# Patient Record
Sex: Male | Born: 1950
Health system: Southern US, Community
[De-identification: ages and names within clinical notes are randomized; demographics above are authoritative.]

## PROBLEM LIST (undated history)

## (undated) DIAGNOSIS — I2510 Atherosclerotic heart disease of native coronary artery without angina pectoris: Secondary | ICD-10-CM

## (undated) DIAGNOSIS — E785 Hyperlipidemia, unspecified: Secondary | ICD-10-CM

## (undated) DIAGNOSIS — M545 Low back pain, unspecified: Secondary | ICD-10-CM

## (undated) DIAGNOSIS — I4510 Unspecified right bundle-branch block: Secondary | ICD-10-CM

## (undated) DIAGNOSIS — K219 Gastro-esophageal reflux disease without esophagitis: Secondary | ICD-10-CM

## (undated) DIAGNOSIS — K635 Polyp of colon: Secondary | ICD-10-CM

## (undated) DIAGNOSIS — I10 Essential (primary) hypertension: Secondary | ICD-10-CM

## (undated) DIAGNOSIS — C61 Malignant neoplasm of prostate: Secondary | ICD-10-CM

## (undated) DIAGNOSIS — G4730 Sleep apnea, unspecified: Secondary | ICD-10-CM

## (undated) DIAGNOSIS — E119 Type 2 diabetes mellitus without complications: Secondary | ICD-10-CM

## (undated) HISTORY — DX: Essential (primary) hypertension: I10

## (undated) HISTORY — DX: Malignant neoplasm of prostate: C61

## (undated) HISTORY — DX: Low back pain: M54.5

## (undated) HISTORY — DX: Hyperlipidemia, unspecified: E78.5

## (undated) HISTORY — PX: OTHER SURGICAL HISTORY: SHX169

## (undated) HISTORY — PX: TONSILLECTOMY: SUR1361

## (undated) HISTORY — PX: TOOTH EXTRACTION: SUR596

## (undated) HISTORY — DX: Low back pain, unspecified: M54.50

## (undated) HISTORY — DX: Atherosclerotic heart disease of native coronary artery without angina pectoris: I25.10

## (undated) HISTORY — PX: ABDOMINAL SURGERY: SHX537

## (undated) HISTORY — PX: MENISCUS REPAIR: SHX5179

## (undated) HISTORY — DX: Unspecified right bundle-branch block: I45.10

## (undated) HISTORY — DX: Polyp of colon: K63.5

## (undated) HISTORY — DX: Sleep apnea, unspecified: G47.30

## (undated) HISTORY — DX: Gastro-esophageal reflux disease without esophagitis: K21.9

## (undated) HISTORY — PX: INGUINAL HERNIA REPAIR: SUR1180

---

## 1994-06-13 HISTORY — PX: BACK SURGERY: SHX140

## 2006-11-19 ENCOUNTER — Emergency Department (HOSPITAL_COMMUNITY): Admission: EM | Admit: 2006-11-19 | Discharge: 2006-11-19 | Payer: Self-pay | Admitting: Emergency Medicine

## 2007-02-14 ENCOUNTER — Emergency Department (HOSPITAL_COMMUNITY): Admission: EM | Admit: 2007-02-14 | Discharge: 2007-02-14 | Payer: Self-pay | Admitting: Emergency Medicine

## 2009-07-14 ENCOUNTER — Encounter: Admission: RE | Admit: 2009-07-14 | Discharge: 2009-07-14 | Payer: Self-pay | Admitting: *Deleted

## 2009-11-26 ENCOUNTER — Ambulatory Visit (HOSPITAL_COMMUNITY): Admission: RE | Admit: 2009-11-26 | Discharge: 2009-11-26 | Payer: Self-pay | Admitting: General Surgery

## 2010-08-30 LAB — DIFFERENTIAL
Basophils Relative: 1 % (ref 0–1)
Eosinophils Relative: 3 % (ref 0–5)
Lymphocytes Relative: 26 % (ref 12–46)
Lymphs Abs: 2.4 10*3/uL (ref 0.7–4.0)
Neutro Abs: 5.9 10*3/uL (ref 1.7–7.7)
Neutrophils Relative %: 63 % (ref 43–77)

## 2010-08-30 LAB — BASIC METABOLIC PANEL
CO2: 27 mEq/L (ref 19–32)
Chloride: 107 mEq/L (ref 96–112)
GFR calc Af Amer: >60 mL/min (ref >60)
GFR calc non Af Amer: >60 mL/min (ref >60)
Glucose, Bld: 114 mg/dL — ABNORMAL HIGH (ref 70–99)
Potassium: 4.1 mEq/L (ref 3.5–5.1)
Sodium: 141 mEq/L (ref 135–145)

## 2010-08-30 LAB — CBC
MCV: 95.4 fL (ref 78.0–100.0)
Platelets: 207 10*3/uL (ref 150–400)
RDW: 13.4 % (ref 11.5–15.5)

## 2010-11-04 ENCOUNTER — Emergency Department (HOSPITAL_COMMUNITY): Payer: BC Managed Care – PPO

## 2010-11-04 ENCOUNTER — Emergency Department (HOSPITAL_COMMUNITY)
Admission: EM | Admit: 2010-11-04 | Discharge: 2010-11-04 | Disposition: A | Discharge status: Home / Self Care | Payer: BC Managed Care – PPO | Attending: Emergency Medicine | Admitting: Emergency Medicine

## 2010-11-04 DIAGNOSIS — M1990 Unspecified osteoarthritis, unspecified site: Secondary | ICD-10-CM | POA: Insufficient documentation

## 2010-11-04 DIAGNOSIS — I10 Essential (primary) hypertension: Secondary | ICD-10-CM | POA: Insufficient documentation

## 2010-11-04 DIAGNOSIS — D72829 Elevated white blood cell count, unspecified: Secondary | ICD-10-CM | POA: Insufficient documentation

## 2010-11-04 DIAGNOSIS — F17200 Nicotine dependence, unspecified, uncomplicated: Secondary | ICD-10-CM | POA: Insufficient documentation

## 2010-11-04 DIAGNOSIS — R1110 Vomiting, unspecified: Secondary | ICD-10-CM | POA: Insufficient documentation

## 2010-11-04 DIAGNOSIS — R1032 Left lower quadrant pain: Secondary | ICD-10-CM | POA: Insufficient documentation

## 2010-11-04 DIAGNOSIS — M549 Dorsalgia, unspecified: Secondary | ICD-10-CM | POA: Insufficient documentation

## 2010-11-04 DIAGNOSIS — R109 Unspecified abdominal pain: Secondary | ICD-10-CM | POA: Insufficient documentation

## 2010-11-04 LAB — DIFFERENTIAL
Eosinophils Relative: 2 % (ref 0–5)
Lymphocytes Relative: 19 % (ref 12–46)
Monocytes Absolute: 1.2 10*3/uL — ABNORMAL HIGH (ref 0.1–1.0)
Monocytes Relative: 11 % (ref 3–12)
Neutro Abs: 7.5 10*3/uL (ref 1.7–7.7)

## 2010-11-04 LAB — CBC
MCHC: 33.6 g/dL (ref 30.0–36.0)
Platelets: 220 10*3/uL (ref 150–400)
RBC: 4.66 MIL/uL (ref 4.22–5.81)
RDW: 12.9 % (ref 11.5–15.5)
WBC: 10.9 10*3/uL — ABNORMAL HIGH (ref 4.0–10.5)

## 2010-11-04 LAB — URINALYSIS, ROUTINE W REFLEX MICROSCOPIC
Bilirubin Urine: NEGATIVE
Protein, ur: NEGATIVE mg/dL
Specific Gravity, Urine: 1.027 (ref 1.005–1.030)

## 2010-11-04 LAB — COMPREHENSIVE METABOLIC PANEL
AST: 19 U/L (ref 0–37)
CO2: 24 mEq/L (ref 19–32)
Chloride: 102 mEq/L (ref 96–112)
Creatinine, Ser: 0.83 mg/dL (ref 0.4–1.5)
GFR calc Af Amer: >60 mL/min (ref >60)
GFR calc non Af Amer: >60 mL/min (ref >60)
Potassium: 3.8 mEq/L (ref 3.5–5.1)
Sodium: 136 mEq/L (ref 135–145)
Total Bilirubin: 0.4 mg/dL (ref 0.3–1.2)

## 2010-11-04 MED ORDER — IOHEXOL 300 MG/ML  SOLN
100.0000 mL | Freq: Once | INTRAMUSCULAR | Status: AC | PRN
  Administered 2010-11-04: 100 mL via INTRAVENOUS

## 2010-11-16 ENCOUNTER — Other Ambulatory Visit: Payer: Self-pay | Admitting: Physical Medicine and Rehabilitation

## 2010-11-16 DIAGNOSIS — M1990 Unspecified osteoarthritis, unspecified site: Secondary | ICD-10-CM

## 2010-11-16 DIAGNOSIS — M25551 Pain in right hip: Secondary | ICD-10-CM

## 2010-11-24 ENCOUNTER — Other Ambulatory Visit: Payer: BC Managed Care – PPO

## 2011-03-08 DIAGNOSIS — C61 Malignant neoplasm of prostate: Secondary | ICD-10-CM

## 2011-03-08 HISTORY — DX: Malignant neoplasm of prostate: C61

## 2011-06-21 DIAGNOSIS — N529 Male erectile dysfunction, unspecified: Secondary | ICD-10-CM | POA: Insufficient documentation

## 2011-06-21 DIAGNOSIS — N393 Stress incontinence (female) (male): Secondary | ICD-10-CM | POA: Insufficient documentation

## 2011-06-21 HISTORY — DX: Male erectile dysfunction, unspecified: N52.9

## 2011-12-16 ENCOUNTER — Other Ambulatory Visit: Payer: Self-pay | Admitting: Family Medicine

## 2011-12-16 DIAGNOSIS — R0600 Dyspnea, unspecified: Secondary | ICD-10-CM

## 2011-12-16 DIAGNOSIS — R05 Cough: Secondary | ICD-10-CM

## 2012-01-12 HISTORY — PX: PROSTATECTOMY: SHX69

## 2012-01-27 ENCOUNTER — Ambulatory Visit
Admission: RE | Admit: 2012-01-27 | Discharge: 2012-01-27 | Disposition: A | Discharge status: Home / Self Care | Payer: BC Managed Care – PPO | Source: Ambulatory Visit | Attending: Family Medicine | Admitting: Family Medicine

## 2012-01-27 DIAGNOSIS — R0600 Dyspnea, unspecified: Secondary | ICD-10-CM

## 2012-01-27 DIAGNOSIS — R05 Cough: Secondary | ICD-10-CM

## 2012-03-09 DIAGNOSIS — N35919 Unspecified urethral stricture, male, unspecified site: Secondary | ICD-10-CM | POA: Insufficient documentation

## 2012-06-13 DIAGNOSIS — C61 Malignant neoplasm of prostate: Secondary | ICD-10-CM

## 2012-06-13 HISTORY — DX: Malignant neoplasm of prostate: C61

## 2012-08-16 DIAGNOSIS — Z8546 Personal history of malignant neoplasm of prostate: Secondary | ICD-10-CM | POA: Insufficient documentation

## 2012-08-16 DIAGNOSIS — R6882 Decreased libido: Secondary | ICD-10-CM

## 2012-08-16 DIAGNOSIS — N5082 Scrotal pain: Secondary | ICD-10-CM

## 2012-08-16 HISTORY — DX: Decreased libido: R68.82

## 2012-08-16 HISTORY — DX: Scrotal pain: N50.82

## 2012-08-16 HISTORY — DX: Personal history of malignant neoplasm of prostate: Z85.46

## 2012-11-06 DIAGNOSIS — E538 Deficiency of other specified B group vitamins: Secondary | ICD-10-CM | POA: Insufficient documentation

## 2013-04-01 ENCOUNTER — Ambulatory Visit: Payer: Self-pay | Admitting: Podiatry

## 2013-04-03 ENCOUNTER — Ambulatory Visit: Payer: Self-pay | Admitting: Podiatry

## 2013-04-08 ENCOUNTER — Ambulatory Visit: Payer: Self-pay | Admitting: Podiatry

## 2013-08-16 ENCOUNTER — Other Ambulatory Visit: Payer: Self-pay | Admitting: Family

## 2013-08-16 DIAGNOSIS — R7989 Other specified abnormal findings of blood chemistry: Secondary | ICD-10-CM

## 2013-08-23 ENCOUNTER — Ambulatory Visit
Admission: RE | Admit: 2013-08-23 | Discharge: 2013-08-23 | Disposition: A | Discharge status: Home / Self Care | Payer: BC Managed Care – PPO | Source: Ambulatory Visit | Attending: Family | Admitting: Family

## 2013-08-23 DIAGNOSIS — R7989 Other specified abnormal findings of blood chemistry: Secondary | ICD-10-CM

## 2014-03-25 ENCOUNTER — Encounter: Payer: Self-pay | Admitting: *Deleted

## 2014-06-17 ENCOUNTER — Other Ambulatory Visit: Payer: Self-pay | Admitting: Family Medicine

## 2014-06-17 DIAGNOSIS — R911 Solitary pulmonary nodule: Secondary | ICD-10-CM

## 2014-06-17 DIAGNOSIS — R05 Cough: Secondary | ICD-10-CM

## 2014-06-17 DIAGNOSIS — Z87891 Personal history of nicotine dependence: Secondary | ICD-10-CM

## 2014-06-17 DIAGNOSIS — R053 Chronic cough: Secondary | ICD-10-CM

## 2014-06-19 ENCOUNTER — Ambulatory Visit
Admission: RE | Admit: 2014-06-19 | Discharge: 2014-06-19 | Disposition: A | Discharge status: Home / Self Care | Payer: BLUE CROSS/BLUE SHIELD | Source: Ambulatory Visit | Attending: Family Medicine | Admitting: Family Medicine

## 2014-06-19 DIAGNOSIS — R911 Solitary pulmonary nodule: Secondary | ICD-10-CM

## 2014-06-19 DIAGNOSIS — Z87891 Personal history of nicotine dependence: Secondary | ICD-10-CM

## 2014-06-19 DIAGNOSIS — R053 Chronic cough: Secondary | ICD-10-CM

## 2014-06-19 DIAGNOSIS — R05 Cough: Secondary | ICD-10-CM

## 2014-06-19 MED ORDER — IOHEXOL 300 MG/ML  SOLN
75.0000 mL | Freq: Once | INTRAMUSCULAR | Status: AC | PRN
  Administered 2014-06-19: 75 mL via INTRAVENOUS

## 2014-07-02 ENCOUNTER — Institutional Professional Consult (permissible substitution): Payer: BLUE CROSS/BLUE SHIELD | Admitting: Internal Medicine

## 2014-09-01 ENCOUNTER — Ambulatory Visit (INDEPENDENT_AMBULATORY_CARE_PROVIDER_SITE_OTHER): Payer: BLUE CROSS/BLUE SHIELD | Admitting: Pulmonary Disease

## 2014-09-01 ENCOUNTER — Encounter: Payer: Self-pay | Admitting: Pulmonary Disease

## 2014-09-01 VITALS — BP 148/80 | HR 70 | Temp 97.1°F | Ht 71.0 in | Wt 265.8 lb

## 2014-09-01 DIAGNOSIS — R918 Other nonspecific abnormal finding of lung field: Secondary | ICD-10-CM | POA: Diagnosis not present

## 2014-09-01 DIAGNOSIS — R0609 Other forms of dyspnea: Secondary | ICD-10-CM

## 2014-09-01 MED ORDER — VARENICLINE TARTRATE 1 MG PO TABS: 1.0000 mg | ORAL_TABLET | Freq: Two times a day (BID) | ORAL | Status: DC

## 2014-09-01 MED ORDER — VARENICLINE TARTRATE 0.5 MG X 11 & 1 MG X 42 PO MISC: ORAL | Status: DC

## 2014-09-01 NOTE — Progress Notes (Signed)
   Subjective:    Patient ID: James Lee, male    DOB: 18-Jul-1950, 64 y.o.   MRN: [id number]  HPI The patient is a 64 year old male who I've been asked to see for multiple pulmonary issues. He has a long-standing history of smoking, and unfortunately continues to smoke about a pack and a half of cigarettes a day. He has had a chronic cough that is much improved since discontinuation of his ACE inhibitor. He has had a recent CT chest that showed multiple pulmonary nodules, as well as emphysematous changes. Overall, the patient has fairly good exertional tolerance, and states that he can walk miles at a moderate pace without getting winded. However, if he walks up a flight of stairs he will come short of breath. He is overweight, but his weight is been neutral over the last few years. He tells me that he has approximately 2 episodes of acute bronchitis a year, but has not required hospitalization for breathing issues. He does have a history of prostate cancer for which she was treated with a prostatectomy, and has had normal PSA values since that time.   Review of Systems  Constitutional: Negative for fever, chills, activity change, appetite change and unexpected weight change.  HENT: Positive for congestion and dental problem. Negative for postnasal drip, rhinorrhea, sneezing, sore throat, trouble swallowing and voice change.   Eyes: Negative for visual disturbance.  Respiratory: Positive for cough and shortness of breath. Negative for choking.   Cardiovascular: Negative for chest pain and leg swelling.  Gastrointestinal: Negative for nausea, vomiting and abdominal pain.  Genitourinary: Negative for difficulty urinating.  Musculoskeletal: Positive for arthralgias.  Skin: Negative for rash.  Psychiatric/Behavioral: Negative for behavioral problems and confusion.       Objective:   Physical Exam Constitutional:  Obese male, no acute distress  HENT:  Nares patent without discharge  Oropharynx  without exudate, palate and uvula are normal  Eyes:  Perrla, eomi, no scleral icterus  Neck:  No JVD, no TMG  Cardiovascular:  Normal rate, regular rhythm, no rubs or gallops.  No murmurs        Intact distal pulses  Pulmonary :  Normal breath sounds, no stridor or respiratory distress   No rales, rhonchi, or wheezing  Abdominal:  Soft, nondistended, bowel sounds present.  No tenderness noted.   Musculoskeletal:  No lower extremity edema noted.  Lymph Nodes:  No cervical lymphadenopathy noted  Skin:  No cyanosis noted  Neurologic:  Alert, appropriate, moves all 4 extremities without obvious deficit.         Assessment & Plan:

## 2014-09-01 NOTE — Assessment & Plan Note (Signed)
The patient has only mild dyspnea on exertion that is primarily with heavy her activities, but his CT chest does have emphysematous changes. At this point, he will need full pulmonary function studies for evaluation. He has had a chronic cough that is much improved since discontinuation of his ACE inhibitor, and I suspect it will totally resolve once he is able to quit smoking.

## 2014-09-01 NOTE — Patient Instructions (Signed)
Will schedule for breathing studies in next 2-4 weeks, and see you back on same day to review with you Will schedule for followup scan of your chest in 96mos, and will call with results. Start chantix one starter pack and 2 maintenance packs.  You should start cutting way back now, and have a quit date 4 weeks down the road.

## 2014-09-01 NOTE — Assessment & Plan Note (Signed)
The patient has multiple pulmonary nodules on his CT chest in January, and is at increased risk for cancer given his extensive smoking history. It is unlikely this is related to his prior history of prostate cancer. I would normally do a one-year follow-up for his smaller nodules, but he has a 6 mm groundglass opacity in the left upper lobe that I think needs follow-up in 6 months.

## 2014-09-02 ENCOUNTER — Encounter: Payer: Self-pay | Admitting: Cardiology

## 2014-09-02 ENCOUNTER — Ambulatory Visit (INDEPENDENT_AMBULATORY_CARE_PROVIDER_SITE_OTHER): Payer: BLUE CROSS/BLUE SHIELD | Admitting: Cardiology

## 2014-09-02 VITALS — BP 146/78 | HR 69 | Ht 71.0 in | Wt 261.1 lb

## 2014-09-02 DIAGNOSIS — R0609 Other forms of dyspnea: Secondary | ICD-10-CM

## 2014-09-02 DIAGNOSIS — E785 Hyperlipidemia, unspecified: Secondary | ICD-10-CM

## 2014-09-02 DIAGNOSIS — I10 Essential (primary) hypertension: Secondary | ICD-10-CM | POA: Diagnosis not present

## 2014-09-02 DIAGNOSIS — G4730 Sleep apnea, unspecified: Secondary | ICD-10-CM | POA: Diagnosis not present

## 2014-09-02 DIAGNOSIS — I2510 Atherosclerotic heart disease of native coronary artery without angina pectoris: Secondary | ICD-10-CM

## 2014-09-02 DIAGNOSIS — E1169 Type 2 diabetes mellitus with other specified complication: Secondary | ICD-10-CM | POA: Insufficient documentation

## 2014-09-02 HISTORY — DX: Atherosclerotic heart disease of native coronary artery without angina pectoris: I25.10

## 2014-09-02 HISTORY — DX: Essential (primary) hypertension: I10

## 2014-09-02 NOTE — Patient Instructions (Signed)
Dr. Radford Pax recommends you have an [facility].  Your physician wants you to follow-up in: 1 year with Dr. Radford Pax. You will receive a reminder letter in the mail two months in advance. If you don't receive a letter, please call our office to schedule the follow-up appointment.

## 2014-09-02 NOTE — Progress Notes (Signed)
Cardiology Office Note   Date:  09/02/2014   ID:  James Lee, DOB 1951-05-26, MRN [id number]  PCP:  Antony Blackbird, MD  Cardiologist:   Sueanne Margarita, MD   No chief complaint on file.     History of Present Illness: James Lee is a 64 y.o. male who presents for evaluation of OSA. He has a history of OSA and has been on CPAP for some time but was lost to followup.  He uses his CPAP nightly and feels rested in the am.  He does have some daytime sleepiness and occasionally takes an afternoon nap.  He uses the nasal pillow mask which he tolerates well.  He uses [facility] for his DME.  He has a history of coronary artery calcifications on chest CT in 2013 and had a normal nuclear stress test in 2012.  He has a family  History of CAD and has dyslipidemia and HTN.  He recently had another chest CT that showed persistence of the coronary artery calcifications and he is now referred by his PCP.  He denies any chest pain or pressure.  He has minimal DOE with heavy activities.  Chest xray did show emphysematous changes.  He is now referred to Cardiology for evaluation for CAD.    Past Medical History  Diagnosis Date  . Hypertension   . LBP (low back pain)   . Dyslipidemia   . Colon polyps   . Sleep apnea   . Prostate cancer   . Hyperlipidemia     Past Surgical History  Procedure Laterality Date  . Prostatectomy  01/2012  . Back surgery  1996     Current Outpatient Prescriptions  Medication Sig Dispense Refill  . amLODipine (NORVASC) 10 MG tablet Take 10 mg by mouth daily.    Marland Kitchen aspirin 81 MG tablet Take 81 mg by mouth daily.    . Multiple Vitamins-Minerals (MULTIVITAMIN WITH MINERALS) tablet Take 1 tablet by mouth daily.    . naproxen (EC NAPROSYN) 500 MG EC tablet Take 500 mg by mouth 2 (two) times daily with a meal.    . simvastatin (ZOCOR) 20 MG tablet Take 20 mg by mouth daily. Take one tablet in every evening as needed.    . valsartan-hydrochlorothiazide (DIOVAN-HCT) 160-12.5 MG per  tablet Take 1 tablet by mouth daily.    . varenicline (CHANTIX PAK) 0.5 MG X 11 & 1 MG X 42 tablet Take 0.5 mg tab once daily x3 days, then increase to 0.5 mg tab twice daily x4 days then increase 1 mg tab twice daily. 53 tablet 0  . varenicline (CHANTIX) 1 MG tablet Take 1 tablet (1 mg total) by mouth 2 (two) times daily. 60 tablet 1   No current facility-administered medications for this visit.    Allergies:   Review of patient's allergies indicates no known allergies.    Social History:  The patient  reports that he has been smoking Cigarettes.  He has a 60 pack-year smoking history. He does not have any smokeless tobacco history on file. He reports that he does not drink alcohol or use illicit drugs.   Family History:  The patient's family history includes Cancer in his father; Coronary artery disease in his mother; Throat cancer in his father.    ROS:  Please see the history of present illness.   Otherwise, review of systems are positive for none.   All other systems are reviewed and negative.    PHYSICAL EXAM: VS:  BP 146/78  mmHg  Pulse 69  Ht 5\' 11"  (1.803 m)  Wt 261 lb 1.9 oz (118.443 kg)  BMI 36.43 kg/m2 , BMI Body mass index is 36.43 kg/(m^2). GEN: Well nourished, well developed, in no acute distress HEENT: normal Neck: no JVD, carotid bruits, or masses Cardiac: RRR; no murmurs, rubs, or gallops,no edema  Respiratory:  clear to auscultation bilaterally, normal work of breathing GI: soft, nontender, nondistended, + BS MS: no deformity or atrophy Skin: warm and dry, no rash Neuro:  Strength and sensation are intact Psych: euthymic mood, full affect   EKG:  EKG is ordered today. The ekg ordered today demonstrates NSR with no ST changes   Recent Labs: No results found for requested labs within last 365 days.    Lipid Panel No results found for: CHOL, TRIG, HDL, CHOLHDL, VLDL, LDLCALC, LDLDIRECT    Wt Readings from Last 3 Encounters:  09/02/14 261 lb 1.9 oz  (118.443 kg)  09/01/14 265 lb 12.8 oz (120.566 kg)      Other studies Reviewed: Additional studies/ records that were reviewed today include: OV notes from Pulmonary and PCP. Review of the above records demonstrates: workup of pulmonary nodules per pulonary   ASSESSMENT AND PLAN:  1.  Coronary artery calcifications on Chest CT which were present back in 2013.  He is asymptomatic from a cardiac standpoint.  His last stress test was 4 years ago.  He needs aggressive risk factor modification.  He is going on Chantix today that Dr. Gwenette Greet prescribed.  I have recommended a nuclear stress test to rule out ischemia.  He will continue on ASA daily.  He needs aggressive control of HTN. 2.  HTN with borderline control.  Continue amlodipine/Diovan HCT 3.  Tobacco use - he is starting CHantix 4.  Hyperlipidemia - continue statin 5.  OSA on CPAP and tolerating well.  I will get a CPAP d/l   Current medicines are reviewed at length with the patient today.  The patient does not have concerns regarding medicines.  The following changes have been made:  no change  Labs/ tests ordered today include: Stress myoview, CPAP d/l  No orders of the defined types were placed in this encounter.     Disposition:   FU with me in 1 year   Signed, Sueanne Margarita, MD  09/02/2014 9:21 AM    [address], [place], [place]  [address] Phone: [phone]; Fax: [phone]

## 2014-09-16 ENCOUNTER — Ambulatory Visit (HOSPITAL_COMMUNITY): Payer: BLUE CROSS/BLUE SHIELD | Attending: Cardiovascular Disease | Admitting: Radiology

## 2014-09-16 DIAGNOSIS — I2510 Atherosclerotic heart disease of native coronary artery without angina pectoris: Secondary | ICD-10-CM

## 2014-09-16 DIAGNOSIS — I10 Essential (primary) hypertension: Secondary | ICD-10-CM | POA: Insufficient documentation

## 2014-09-16 DIAGNOSIS — R0609 Other forms of dyspnea: Secondary | ICD-10-CM | POA: Insufficient documentation

## 2014-09-16 MED ORDER — TECHNETIUM TC 99M SESTAMIBI GENERIC - CARDIOLITE
11.0000 | Freq: Once | INTRAVENOUS | Status: AC | PRN
  Administered 2014-09-16: 11 via INTRAVENOUS

## 2014-09-16 MED ORDER — TECHNETIUM TC 99M SESTAMIBI GENERIC - CARDIOLITE
33.0000 | Freq: Once | INTRAVENOUS | Status: AC | PRN
Start: 2014-09-16 — End: 2014-09-16
  Administered 2014-09-16: 33 via INTRAVENOUS

## 2014-09-16 NOTE — Progress Notes (Signed)
[address], [place] [phone]    Cardiology Nuclear Med Study  James Lee is a 64 y.o. male     MRN : [id number]     DOB: 1951/05/06  Procedure Date: 09/16/2014  Nuclear Med Background Indication for Stress Test:  Evaluation for Ischemia History:  '12 MPI: [facility]  Cardiac Risk Factors: Hypertension  Symptoms:  DOE   Nuclear Pre-Procedure Caffeine/Decaff Intake:  None NPO After: 7:00pm   Lungs:  clear O2 Sat: 97% on room air. IV 0.9% NS with Angio Cath:  22g  IV Site: R Hand  IV Started by:  Matilde Haymaker, RN  Chest Size (in):  46 Cup Size: n/a  Height: 5\' 11"  (1.803 m)  Weight:  264 lb (119.75 kg)  BMI:  Body mass index is 36.84 kg/(m^2). Tech Comments:  n/a    Nuclear Med Study 1 or 2 day study: 1 day  Stress Test Type:  Stress  Reading MD: n/a  Order Authorizing Provider:  Kelty Szafran,MD  Resting Radionuclide: Technetium 63m Sestamibi  Resting Radionuclide Dose: 11.0 mCi   Stress Radionuclide:  Technetium 40m Sestamibi  Stress Radionuclide Dose: 33.0 mCi           Stress Protocol Rest HR: 61 Stress HR: 141  Rest BP: 114/66 Stress BP: 214/72  Exercise Time (min): 10:01 METS: 10.40   Predicted Max HR: 159 bpm % Max HR: 88.68 bpm Rate Pressure Product: 30174   Dose of Adenosine (mg):  n/a Dose of Lexiscan: n/a mg  Dose of Atropine (mg): n/a Dose of Dobutamine: n/a mcg/kg/min (at max HR)  Stress Test Technologist: Perrin Maltese, EMT-P  Nuclear Technologist:  Earl Many, CNMT     Rest Procedure:  Myocardial perfusion imaging was performed at rest 45 minutes following the intravenous administration of Technetium 71m Sestamibi. Rest ECG: NSR - Normal EKG  Stress Procedure:  The patient exercised on the treadmill utilizing the Bruce Protocol for 10:01 minutes. The patient stopped due to fatigue, sob,  and denied any chest pain.  Technetium 60m Sestamibi was injected at peak exercise and  myocardial perfusion imaging was performed after a brief delay. Stress ECG: occasional PVC's were noted and short 7 beat run of nonsustained atrial tachycardia.  No ST changes of ischemia.   QPS Raw Data Images:  Mild diaphragmatic attenuation.  Normal left ventricular size. Stress Images:  There is decreased uptake in the inferior wall. Rest Images:  There is decreased uptake in the inferior wall. Subtraction (SDS):  small in size, moderate in intensity, fixed defects in the basal and apical walls.  No ischemia noted. Transient Ischemic Dilatation (Normal <1.22):  0.80 Lung/Heart Ratio (Normal <0.45):  0.34  Quantitative Gated Spect Images QGS EDV:  153 ml QGS ESV:  68 ml  Impression Exercise Capacity:  Good exercise capacity. BP Response:  Hypotensive blood pressure response. Clinical Symptoms:  There is dyspnea. ECG Impression:  No significant ST segment change suggestive of ischemia. Comparison with Prior Nuclear Study: No images to compare  Overall Impression:  Intermediate risk stress nuclear study with small in size, moderate in intensity, fixed defects in the basal and apical inferior wall.  No ischemia noted. This most likely represents diaphragmatic attenuation.  The patient did have a hypotensive response during exercise.  Initial BP was 157/59mmHg.  This dropped to 118/52mmHg in stage 3 but went up to 214/60mmHg in immediate recovery so question whether lower BP readings during exercise were  accurate.  LV Ejection Fraction: 56%.  LV Wall Motion:  NL LV Function; NL Wall Motion  Signed: Fransico Him, MD [facility] 09/16/2014

## 2014-09-27 ENCOUNTER — Other Ambulatory Visit: Payer: Self-pay | Admitting: Pulmonary Disease

## 2014-09-30 ENCOUNTER — Telehealth: Payer: Self-pay | Admitting: Pulmonary Disease

## 2014-09-30 NOTE — Telephone Encounter (Signed)
Should postpone at least one week to get accurate results.

## 2014-09-30 NOTE — Telephone Encounter (Signed)
Spoke with pt, states he's been treated with URI the past week.  Pt is scheduled for a PFT tomorrow, wants to know if he should reschedule d/t the URI giving false results.  [place]ather please advise.  Thanks!

## 2014-09-30 NOTE — Telephone Encounter (Signed)
Pt is aware of recommendations. Per Suanne Marker- Please call pt to schd appt with [facility] first. Then they will schedule the PFT at [facility].  Pt needs to have this done on Tuesday or a Wednesday morning.

## 2014-09-30 NOTE — Telephone Encounter (Signed)
Spoke with the pt and scheduled ov with [facility] for 10/21/14  Will forward to [facility] to coordinate PFT appt to be done at [facility]

## 2014-09-30 NOTE — Telephone Encounter (Signed)
Pt returning call  [phone]

## 2014-09-30 NOTE — Telephone Encounter (Signed)
LMTCB

## 2014-10-01 ENCOUNTER — Ambulatory Visit: Payer: BLUE CROSS/BLUE SHIELD | Admitting: Pulmonary Disease

## 2014-10-01 NOTE — Telephone Encounter (Signed)
KC please advise. Thanks. 

## 2014-10-01 NOTE — Telephone Encounter (Signed)
Well on 10/21/14 [facility] only has a 11am pft that will not give them time to be here by 12 what do you suggest we do?

## 2014-10-01 NOTE — Telephone Encounter (Signed)
I have scheduled PFT for 10/22/14 at 10:45am at [facility]. ROV with [place] [facility] has been rescheduled to 10/22/14 at 11:45am.  Pt needed both appointments on the same day. Pt is aware of both appointments time, location and date. Nothing further was needed.

## 2014-10-01 NOTE — Telephone Encounter (Signed)
Either change his apptm with me and pfts to a different day, or have him keep pft appt and reschedule with me a different day.

## 2014-10-01 NOTE — Telephone Encounter (Signed)
LMOAM for Resp at [facility] to return my call to [phone] to scheduled PFT before appointment. Rhonda J Cobb

## 2014-10-20 ENCOUNTER — Encounter (HOSPITAL_COMMUNITY): Payer: BLUE CROSS/BLUE SHIELD

## 2014-10-21 ENCOUNTER — Ambulatory Visit: Payer: BLUE CROSS/BLUE SHIELD | Admitting: Pulmonary Disease

## 2014-10-22 ENCOUNTER — Ambulatory Visit (INDEPENDENT_AMBULATORY_CARE_PROVIDER_SITE_OTHER): Payer: BLUE CROSS/BLUE SHIELD | Admitting: Pulmonary Disease

## 2014-10-22 ENCOUNTER — Ambulatory Visit (HOSPITAL_COMMUNITY)
Admission: RE | Admit: 2014-10-22 | Discharge: 2014-10-22 | Disposition: A | Discharge status: Home / Self Care | Payer: BLUE CROSS/BLUE SHIELD | Source: Ambulatory Visit | Attending: Pulmonary Disease | Admitting: Pulmonary Disease

## 2014-10-22 ENCOUNTER — Encounter: Payer: Self-pay | Admitting: Pulmonary Disease

## 2014-10-22 VITALS — BP 126/66 | HR 63 | Temp 97.8°F | Ht 71.0 in | Wt 264.0 lb

## 2014-10-22 DIAGNOSIS — R0609 Other forms of dyspnea: Secondary | ICD-10-CM | POA: Insufficient documentation

## 2014-10-22 DIAGNOSIS — R918 Other nonspecific abnormal finding of lung field: Secondary | ICD-10-CM | POA: Diagnosis not present

## 2014-10-22 LAB — PULMONARY FUNCTION TEST
DL/VA % pred: 89 %
DL/VA: 4.19 ml/min/mmHg/L
DLCO unc % pred: 87 %
DLCO unc: 29.54 ml/min/mmHg
FEF 25-75 PRE: 2.5 L/s
FEF 25-75 Post: 1.8 L/sec
FEF2575-%Change-Post: -28 %
FEF2575-%Pred-Post: 60 %
FEF2575-%Pred-Pre: 83 %
FEV1-%Change-Post: -7 %
FEV1-%Pred-Post: 81 %
FEV1-%Pred-Pre: 87 %
FEV1-Post: 3 L
FEV1-Pre: 3.24 L
FEV1FVC-%Change-Post: -13 %
FEV1FVC-%Pred-Pre: 100 %
FEV6-%Change-Post: 5 %
FEV6-%PRED-POST: 95 %
FEV6-%Pred-Pre: 90 %
FEV6-POST: 4.43 L
FEV6-PRE: 4.2 L
FEV6FVC-%Change-Post: -1 %
FEV6FVC-%PRED-POST: 101 %
FEV6FVC-%PRED-PRE: 102 %
FVC-%Change-Post: 7 %
FVC-%PRED-PRE: 88 %
FVC-%Pred-Post: 94 %
FVC-POST: 4.61 L
FVC-Pre: 4.3 L
POST FEV1/FVC RATIO: 65 %
Post FEV6/FVC ratio: 96 %
Pre FEV1/FVC ratio: 75 %
Pre FEV6/FVC Ratio: 98 %
RV % PRED: 131 %
RV: 3.05 L
TLC % pred: 101 %
TLC: 7.34 L

## 2014-10-22 MED ORDER — ALBUTEROL SULFATE 108 (90 BASE) MCG/ACT IN AEPB: 2.0000 | INHALATION_SPRAY | Freq: Four times a day (QID) | RESPIRATORY_TRACT | Status: DC | PRN

## 2014-10-22 MED ORDER — ALBUTEROL SULFATE (2.5 MG/3ML) 0.083% IN NEBU
2.5000 mg | INHALATION_SOLUTION | Freq: Once | RESPIRATORY_TRACT | Status: AC
  Administered 2014-10-22: 2.5 mg via RESPIRATORY_TRACT

## 2014-10-22 NOTE — Assessment & Plan Note (Signed)
The patient has no significant airflow obstruction on his recent PFTs, but does have evidence for mild air-trapping. Since he does not have significant symptoms, I would prefer to hold off on maintenance bronchodilators, and just have him work on smoking cessation. I will give him a prescription for an albuterol inhaler for rescue, and he understands that if he is needing more frequent use, we might need to consider a maintenance medication. Overall, he has minimal lung disease, and will do well with smoking cessation and weight loss.

## 2014-10-22 NOTE — Assessment & Plan Note (Signed)
The pt has a f/u ct chest in July for f/u. I am primarily interested in his GGO

## 2014-10-22 NOTE — Progress Notes (Signed)
   Subjective:    Patient ID: James Lee, male    DOB: 05/10/1951, 64 y.o.   MRN: [id number]  HPI The patient comes in today for follow-up of his pulmonary function studies, done as part of a workup for his dyspnea with moderate to heavy exertional activities. Surprisingly, he was found to have no airflow obstruction by spirometry, but he did have some mild air-trapping on lung volumes. His total lung capacity and DLCO were normal. I have reviewed the study with him in detail, and answered all of his questions.   Review of Systems  Constitutional: Negative for fever and unexpected weight change.  HENT: Positive for congestion and postnasal drip. Negative for dental problem, ear pain, nosebleeds, rhinorrhea, sinus pressure, sneezing, sore throat and trouble swallowing.   Eyes: Negative for redness and itching.  Respiratory: Positive for cough and shortness of breath. Negative for chest tightness and wheezing.   Cardiovascular: Negative for palpitations and leg swelling.  Gastrointestinal: Negative for nausea and vomiting.  Genitourinary: Negative for dysuria.  Musculoskeletal: Negative for joint swelling.  Skin: Negative for rash.  Neurological: Negative for headaches.  Hematological: Does not bruise/bleed easily.  Psychiatric/Behavioral: Negative for dysphoric mood. The patient is not nervous/anxious.        Objective:   Physical Exam Overweight male in no acute distress Nose without purulence or discharge noted Neck without lymphadenopathy or thyromegaly Lower extremities with minimal edema, no cyanosis Alert and oriented, moves all 4 extremities.       Assessment & Plan:

## 2014-10-22 NOTE — Patient Instructions (Signed)
You do not have any significant COPD by your breathing studies.  Great news You do have some airtrapping, and this will resolve with smoking cessation. Will start on proair respiclick for rescue only.  Can take 2 inhalations every 6 hrs if needed for difficulty breathing.  If you are using consistently, let us know and we can consider a maintenance inhaler on a daily basis Work on weight loss Keep apptm for your scan in July of this year.  Will call you with results. followup with Dr. Lamonte Sakai in one year to check on your breathing, but call if worsening symptoms.

## 2014-11-28 ENCOUNTER — Other Ambulatory Visit: Payer: Self-pay | Admitting: Emergency Medicine

## 2014-11-28 DIAGNOSIS — R918 Other nonspecific abnormal finding of lung field: Secondary | ICD-10-CM

## 2014-11-28 NOTE — Progress Notes (Signed)
Seen by [facility] 10/22/14 CT Chest w/o ordered and pt instructed to follow up with RB CT ordered again but under [facility]

## 2014-12-03 ENCOUNTER — Telehealth: Payer: Self-pay | Admitting: Emergency Medicine

## 2014-12-03 NOTE — Telephone Encounter (Signed)
FYI Dr. Jacinto ReapKarl Ito patient for nodules Serial CT for follow up is scheduled for 7.13.16  Per the last ov on 5.11.16: Patient Instructions     You do not have any significant COPD by your breathing studies. Great news You do have some airtrapping, and this will resolve with smoking cessation. Will start on proair respiclick for rescue only. Can take 2 inhalations every 6 hrs if needed for difficulty breathing. If you are using consistently, let us know and we can consider a maintenance inhaler on a daily basis Work on weight loss Keep apptm for your scan in July of this year. Will call you with results. followup with Dr. Lamonte Sakai in one year to check on your breathing, but call if worsening symptoms   Will sign and forward to RB

## 2014-12-22 ENCOUNTER — Other Ambulatory Visit: Payer: BLUE CROSS/BLUE SHIELD

## 2014-12-24 ENCOUNTER — Inpatient Hospital Stay: Admission: RE | Admit: 2014-12-24 | Payer: BLUE CROSS/BLUE SHIELD | Source: Ambulatory Visit

## 2015-01-28 ENCOUNTER — Ambulatory Visit (INDEPENDENT_AMBULATORY_CARE_PROVIDER_SITE_OTHER)
Admission: RE | Admit: 2015-01-28 | Discharge: 2015-01-28 | Disposition: A | Discharge status: Home / Self Care | Payer: BLUE CROSS/BLUE SHIELD | Source: Ambulatory Visit | Attending: Emergency Medicine | Admitting: Emergency Medicine

## 2015-01-28 DIAGNOSIS — R918 Other nonspecific abnormal finding of lung field: Secondary | ICD-10-CM

## 2015-02-11 ENCOUNTER — Telehealth: Payer: Self-pay | Admitting: Emergency Medicine

## 2015-02-11 NOTE — Telephone Encounter (Signed)
Notes Recorded by Collene Gobble, MD on 02/10/2015 at 5:24 PM Please let patient know that the inflammatory nodules seen on his prior CT scan have resolved. He should not need any further imaging to follow these areas.     Spoke with pt and notified of results per Dr. Lamonte Sakai. Pt verbalized understanding and denied any questions.

## 2015-02-11 NOTE — Progress Notes (Signed)
Quick Note:  Spoke with pt and notified of results per Dr. Byrum. Pt verbalized understanding and denied any questions.  ______ 

## 2015-10-22 ENCOUNTER — Other Ambulatory Visit: Payer: Self-pay | Admitting: Physical Medicine and Rehabilitation

## 2015-10-22 DIAGNOSIS — M545 Low back pain: Secondary | ICD-10-CM

## 2015-10-23 ENCOUNTER — Ambulatory Visit
Admission: RE | Admit: 2015-10-23 | Discharge: 2015-10-23 | Disposition: A | Discharge status: Home / Self Care | Payer: BLUE CROSS/BLUE SHIELD | Source: Ambulatory Visit | Attending: Physical Medicine and Rehabilitation | Admitting: Physical Medicine and Rehabilitation

## 2015-10-23 DIAGNOSIS — M545 Low back pain: Secondary | ICD-10-CM

## 2015-10-23 MED ORDER — GADOBENATE DIMEGLUMINE 529 MG/ML IV SOLN
20.0000 mL | Freq: Once | INTRAVENOUS | Status: AC | PRN
  Administered 2015-10-23: 20 mL via INTRAVENOUS

## 2015-10-28 ENCOUNTER — Other Ambulatory Visit: Payer: Self-pay | Admitting: Physical Medicine and Rehabilitation

## 2015-10-28 DIAGNOSIS — G8929 Other chronic pain: Secondary | ICD-10-CM

## 2015-10-28 DIAGNOSIS — M545 Low back pain, unspecified: Secondary | ICD-10-CM

## 2015-11-03 DIAGNOSIS — M5126 Other intervertebral disc displacement, lumbar region: Secondary | ICD-10-CM | POA: Insufficient documentation

## 2015-11-03 DIAGNOSIS — M4317 Spondylolisthesis, lumbosacral region: Secondary | ICD-10-CM | POA: Insufficient documentation

## 2015-11-04 ENCOUNTER — Other Ambulatory Visit: Payer: BLUE CROSS/BLUE SHIELD

## 2015-11-04 ENCOUNTER — Ambulatory Visit
Admission: RE | Admit: 2015-11-04 | Discharge: 2015-11-04 | Disposition: A | Discharge status: Home / Self Care | Payer: BLUE CROSS/BLUE SHIELD | Source: Ambulatory Visit | Attending: Physical Medicine and Rehabilitation | Admitting: Physical Medicine and Rehabilitation

## 2015-11-04 DIAGNOSIS — M545 Low back pain, unspecified: Secondary | ICD-10-CM

## 2015-11-04 DIAGNOSIS — G8929 Other chronic pain: Secondary | ICD-10-CM

## 2015-11-04 MED ORDER — IOPAMIDOL (ISOVUE-M 200) INJECTION 41%
1.0000 mL | Freq: Once | INTRAMUSCULAR | Status: AC
  Administered 2015-11-04: 1 mL via EPIDURAL

## 2015-11-04 MED ORDER — METHYLPREDNISOLONE ACETATE 40 MG/ML INJ SUSP (RADIOLOG
120.0000 mg | Freq: Once | INTRAMUSCULAR | Status: AC
  Administered 2015-11-04: 120 mg via EPIDURAL

## 2015-11-04 NOTE — Discharge Instructions (Signed)

## 2015-11-12 ENCOUNTER — Emergency Department (HOSPITAL_COMMUNITY): Payer: BLUE CROSS/BLUE SHIELD

## 2015-11-12 ENCOUNTER — Encounter (HOSPITAL_COMMUNITY): Payer: Self-pay | Admitting: Emergency Medicine

## 2015-11-12 ENCOUNTER — Emergency Department (HOSPITAL_COMMUNITY)
Admission: EM | Admit: 2015-11-12 | Discharge: 2015-11-12 | Disposition: A | Discharge status: Home / Self Care | Payer: BLUE CROSS/BLUE SHIELD | Attending: Emergency Medicine | Admitting: Emergency Medicine

## 2015-11-12 DIAGNOSIS — I10 Essential (primary) hypertension: Secondary | ICD-10-CM | POA: Diagnosis not present

## 2015-11-12 DIAGNOSIS — M5442 Lumbago with sciatica, left side: Secondary | ICD-10-CM | POA: Insufficient documentation

## 2015-11-12 DIAGNOSIS — R791 Abnormal coagulation profile: Secondary | ICD-10-CM | POA: Diagnosis not present

## 2015-11-12 DIAGNOSIS — Z7982 Long term (current) use of aspirin: Secondary | ICD-10-CM | POA: Diagnosis not present

## 2015-11-12 DIAGNOSIS — Z8546 Personal history of malignant neoplasm of prostate: Secondary | ICD-10-CM | POA: Diagnosis not present

## 2015-11-12 DIAGNOSIS — E785 Hyperlipidemia, unspecified: Secondary | ICD-10-CM | POA: Diagnosis not present

## 2015-11-12 DIAGNOSIS — M545 Low back pain: Secondary | ICD-10-CM | POA: Diagnosis present

## 2015-11-12 DIAGNOSIS — M5432 Sciatica, left side: Secondary | ICD-10-CM

## 2015-11-12 DIAGNOSIS — F17210 Nicotine dependence, cigarettes, uncomplicated: Secondary | ICD-10-CM | POA: Insufficient documentation

## 2015-11-12 DIAGNOSIS — Z79899 Other long term (current) drug therapy: Secondary | ICD-10-CM | POA: Diagnosis not present

## 2015-11-12 LAB — CBC WITH DIFFERENTIAL/PLATELET
Basophils Absolute: 0 10*3/uL (ref 0.0–0.1)
Basophils Relative: 0 %
EOS ABS: 0.1 10*3/uL (ref 0.0–0.7)
Eosinophils Relative: 1 %
HEMATOCRIT: 45.7 % (ref 39.0–52.0)
HEMOGLOBIN: 16.1 g/dL (ref 13.0–17.0)
LYMPHS ABS: 4.1 10*3/uL — AB (ref 0.7–4.0)
LYMPHS PCT: 29 %
MCH: 32.1 pg (ref 26.0–34.0)
MCHC: 35.2 g/dL (ref 30.0–36.0)
MCV: 91.2 fL (ref 78.0–100.0)
MONOS PCT: 8 %
Monocytes Absolute: 1.1 10*3/uL — ABNORMAL HIGH (ref 0.1–1.0)
NEUTROS ABS: 8.8 10*3/uL — AB (ref 1.7–7.7)
NEUTROS PCT: 62 %
Platelets: 223 10*3/uL (ref 150–400)
RBC: 5.01 MIL/uL (ref 4.22–5.81)
RDW: 12.8 % (ref 11.5–15.5)
WBC: 14 10*3/uL — ABNORMAL HIGH (ref 4.0–10.5)

## 2015-11-12 LAB — C-REACTIVE PROTEIN: CRP: <0.5 mg/dL (ref <1.0)

## 2015-11-12 LAB — BASIC METABOLIC PANEL
Anion gap: 9 (ref 5–15)
BUN: 20 mg/dL (ref 6–20)
CO2: 26 mmol/L (ref 22–32)
CREATININE: 0.91 mg/dL (ref 0.61–1.24)
Calcium: 9 mg/dL (ref 8.9–10.3)
Chloride: 102 mmol/L (ref 101–111)
GFR calc Af Amer: >60 mL/min (ref >60)
GFR calc non Af Amer: >60 mL/min (ref >60)
GLUCOSE: 148 mg/dL — AB (ref 65–99)
POTASSIUM: 3.7 mmol/L (ref 3.5–5.1)
Sodium: 137 mmol/L (ref 135–145)

## 2015-11-12 LAB — APTT: aPTT: 25 seconds (ref 24–37)

## 2015-11-12 LAB — SEDIMENTATION RATE: Sed Rate: 1 mm/hr (ref 0–16)

## 2015-11-12 LAB — PROTIME-INR
INR: 1.01 (ref 0.00–1.49)
Prothrombin Time: 13.1 seconds (ref 11.6–15.2)

## 2015-11-12 MED ORDER — OXYCODONE HCL 5 MG PO TABS: 5.0000 mg | ORAL_TABLET | ORAL | Status: DC | PRN

## 2015-11-12 MED ORDER — DIAZEPAM 5 MG/ML IJ SOLN
5.0000 mg | Freq: Once | INTRAMUSCULAR | Status: AC
  Administered 2015-11-12: 5 mg via INTRAVENOUS
  Filled 2015-11-12: qty 2

## 2015-11-12 MED ORDER — DIAZEPAM 5 MG PO TABS
5.0000 mg | ORAL_TABLET | Freq: Once | ORAL | Status: DC
  Filled 2015-11-12: qty 1

## 2015-11-12 MED ORDER — SODIUM CHLORIDE 0.9 % IV BOLUS (SEPSIS)
1000.0000 mL | Freq: Once | INTRAVENOUS | Status: AC
  Administered 2015-11-12: 1000 mL via INTRAVENOUS

## 2015-11-12 MED ORDER — ONDANSETRON HCL 4 MG/2ML IJ SOLN
4.0000 mg | Freq: Once | INTRAMUSCULAR | Status: AC
  Administered 2015-11-12: 4 mg via INTRAVENOUS
  Filled 2015-11-12: qty 2

## 2015-11-12 MED ORDER — HYDROMORPHONE HCL 1 MG/ML IJ SOLN
1.0000 mg | Freq: Once | INTRAMUSCULAR | Status: AC
  Administered 2015-11-12: 1 mg via INTRAVENOUS
  Filled 2015-11-12: qty 1

## 2015-11-12 NOTE — ED Notes (Signed)
Patient transported to MRI 

## 2015-11-12 NOTE — ED Notes (Signed)
Bed: [id number] Expected date:  Expected time:  Means of arrival:  Comments: 37 M Lt lower back pain EMS

## 2015-11-12 NOTE — Discharge Instructions (Signed)
Take 4 over the counter ibuprofen tablets 3 times a day or 2 over-the-counter naproxen tablets twice a day for pain. Take the pain medicine only as you need it. Follow up with the neurosurgeon. Sciatica Sciatica is pain, weakness, numbness, or tingling along the path of the sciatic nerve. The nerve starts in the lower back and runs down the back of each leg. The nerve controls the muscles in the lower leg and in the back of the knee, while also providing sensation to the back of the thigh, lower leg, and the sole of your foot. Sciatica is a symptom of another medical condition. For instance, nerve damage or certain conditions, such as a herniated disk or bone spur on the spine, pinch or put pressure on the sciatic nerve. This causes the pain, weakness, or other sensations normally associated with sciatica. Generally, sciatica only affects one side of the body. CAUSES   Herniated or slipped disc.  Degenerative disk disease.  A pain disorder involving the narrow muscle in the buttocks (piriformis syndrome).  Pelvic injury or fracture.  Pregnancy.  Tumor (rare). SYMPTOMS  Symptoms can vary from mild to very severe. The symptoms usually travel from the low back to the buttocks and down the back of the leg. Symptoms can include:  Mild tingling or dull aches in the lower back, leg, or hip.  Numbness in the back of the calf or sole of the foot.  Burning sensations in the lower back, leg, or hip.  Sharp pains in the lower back, leg, or hip.  Leg weakness.  Severe back pain inhibiting movement. These symptoms may get worse with coughing, sneezing, laughing, or prolonged sitting or standing. Also, being overweight may worsen symptoms. DIAGNOSIS  Your caregiver will perform a physical exam to look for common symptoms of sciatica. He or she may ask you to do certain movements or activities that would trigger sciatic nerve pain. Other tests may be performed to find the cause of the sciatica.  These may include:  Blood tests.  X-rays.  Imaging tests, such as an MRI or CT scan. TREATMENT  Treatment is directed at the cause of the sciatic pain. Sometimes, treatment is not necessary and the pain and discomfort goes away on its own. If treatment is needed, your caregiver may suggest:  Over-the-counter medicines to relieve pain.  Prescription medicines, such as anti-inflammatory medicine, muscle relaxants, or narcotics.  Applying heat or ice to the painful area.  Steroid injections to lessen pain, irritation, and inflammation around the nerve.  Reducing activity during periods of pain.  Exercising and stretching to strengthen your abdomen and improve flexibility of your spine. Your caregiver may suggest losing weight if the extra weight makes the back pain worse.  Physical therapy.  Surgery to eliminate what is pressing or pinching the nerve, such as a bone spur or part of a herniated disk. HOME CARE INSTRUCTIONS   Only take over-the-counter or prescription medicines for pain or discomfort as directed by your caregiver.  Apply ice to the affected area for 20 minutes, 3-4 times a day for the first 48-72 hours. Then try heat in the same way.  Exercise, stretch, or perform your usual activities if these do not aggravate your pain.  Attend physical therapy sessions as directed by your caregiver.  Keep all follow-up appointments as directed by your caregiver.  Do not wear high heels or shoes that do not provide proper support.  Check your mattress to see if it is too soft. A  firm mattress may lessen your pain and discomfort. SEEK IMMEDIATE MEDICAL CARE IF:   You lose control of your bowel or bladder (incontinence).  You have increasing weakness in the lower back, pelvis, buttocks, or legs.  You have redness or swelling of your back.  You have a burning sensation when you urinate.  You have pain that gets worse when you lie down or awakens you at night.  Your pain  is worse than you have experienced in the past.  Your pain is lasting longer than 4 weeks.  You are suddenly losing weight without reason. MAKE SURE YOU:  Understand these instructions.  Will watch your condition.  Will get help right away if you are not doing well or get worse.   This information is not intended to replace advice given to you by your health care provider. Make sure you discuss any questions you have with your health care provider.   Document Released: 05/24/2001 Document Revised: 02/18/2015 Document Reviewed: 10/09/2011 Elsevier Interactive Patient Education [employer].

## 2015-11-12 NOTE — ED Provider Notes (Signed)
CSN: [id number]     Arrival date & time 11/12/15  0846 History   First MD Initiated Contact with Patient 11/12/15 [phone]     Chief Complaint  Patient presents with  . Back Pain     (Consider location/radiation/quality/duration/timing/severity/associated sxs/prior Treatment) Patient is a 65 y.o. male presenting with back pain. The history is provided by the patient.  Back Pain Location:  Lumbar spine Quality:  Stabbing Radiates to:  Does not radiate Pain severity:  Moderate Pain is:  Worse during the day Onset quality:  Sudden Duration:  2 days Timing:  Constant Progression:  Worsening Chronicity:  New Relieved by:  Nothing Worsened by:  Nothing tried Ineffective treatments:  None tried Associated symptoms: no abdominal pain, no chest pain, no fever and no headaches    65 yo M With a chief complaint of left-sided low back pain. This been going on for about a month now is seen a spinal specialist who gave him to her for injections. Last injection was about 3 days ago after which he had significant worsening of his pain. This morning woke up and felt that it was worse than at the beginning of the symptoms. Denies any new injuries. Denies fevers or chills. Denies difficulty with bowel or bladder. Denies loss perirectal sensation. Having some paresthesias to the left lower extremity. Has some weakness he thinks is secondary to pain.  Past Medical History  Diagnosis Date  . Hypertension   . LBP (low back pain)   . Dyslipidemia   . Colon polyps   . Sleep apnea   . Prostate cancer ([place])   . Hyperlipidemia    Past Surgical History  Procedure Laterality Date  . Prostatectomy  01/2012  . Back surgery  1996   Family History  Problem Relation Age of Onset  . Throat cancer Father   . Cancer Father   . Coronary artery disease Mother    Social History  Substance Use Topics  . Smoking status: Current Every Day Smoker -- 0.50 packs/day for 40 years    Types: Cigarettes  . Smokeless  tobacco: None  . Alcohol Use: No    Review of Systems  Constitutional: Negative for fever and chills.  HENT: Negative for congestion and facial swelling.   Eyes: Negative for discharge and visual disturbance.  Respiratory: Negative for shortness of breath.   Cardiovascular: Negative for chest pain and palpitations.  Gastrointestinal: Negative for vomiting, abdominal pain and diarrhea.  Musculoskeletal: Positive for myalgias, back pain and arthralgias.  Skin: Negative for color change and rash.  Neurological: Negative for tremors, syncope and headaches.  Psychiatric/Behavioral: Negative for confusion and dysphoric mood.      Allergies  Review of patient's allergies indicates no known allergies.  Home Medications   Prior to Admission medications   Medication Sig Start Date End Date Taking? Authorizing Provider  amLODipine (NORVASC) 10 MG tablet Take 10 mg by mouth daily.   Yes Historical Provider, MD  aspirin 81 MG tablet Take 81 mg by mouth daily.   Yes Historical Provider, MD  gabapentin (NEURONTIN) 100 MG capsule Take 300 mg by mouth at bedtime. 10/08/15  Yes Historical Provider, MD  Multiple Vitamins-Minerals (MULTIVITAMIN WITH MINERALS) tablet Take 1 tablet by mouth daily.   Yes Historical Provider, MD  naproxen (EC NAPROSYN) 500 MG EC tablet Take 500 mg by mouth 2 (two) times daily with a meal.   Yes Historical Provider, MD  naproxen sodium (ANAPROX) 220 MG tablet Take 440 mg by mouth  2 (two) times daily as needed (pain).   Yes Historical Provider, MD  Oxycodone HCl 10 MG TABS Take 10-20 mg by mouth every 6 (six) hours as needed (pain).  11/03/15  Yes Historical Provider, MD  simvastatin (ZOCOR) 20 MG tablet Take 20 mg by mouth daily.    Yes Historical Provider, MD  valsartan-hydrochlorothiazide (DIOVAN-HCT) 320-25 MG tablet Take 1 tablet by mouth daily. 10/19/15  Yes Historical Provider, MD   BP 131/56 mmHg  Pulse 66  Temp(Src) 98.1 F (36.7 C) (Oral)  Resp 19  SpO2  97% Physical Exam  Constitutional: He is oriented to person, place, and time. He appears well-developed and well-nourished.  HENT:  Head: Normocephalic and atraumatic.  Eyes: EOM are normal. Pupils are equal, round, and reactive to light.  Neck: Normal range of motion. Neck supple. No JVD present.  Cardiovascular: Normal rate and regular rhythm.  Exam reveals no gallop and no friction rub.   No murmur heard. Pulmonary/Chest: No respiratory distress. He has no wheezes.  Abdominal: He exhibits no distension. There is no rebound and no guarding.  Musculoskeletal: Normal range of motion.  Neurological: He is alert and oriented to person, place, and time. He displays no Babinski's sign on the right side. He displays no Babinski's sign on the left side.  Reflex Scores:      Patellar reflexes are 3+ on the right side and 0 on the left side.      Achilles reflexes are 3+ on the right side and 0 on the left side. 2 beats of clonus to RLE. Sensation to light touch intact to BLE.   Skin: No rash noted. No pallor.  Psychiatric: He has a normal mood and affect. His behavior is normal.  Nursing note and vitals reviewed.   ED Course  Procedures (including critical care time) Labs Review Labs Reviewed  CBC WITH DIFFERENTIAL/PLATELET - Abnormal; Notable for the following:    WBC 14.0 (*)    Neutro Abs 8.8 (*)    Lymphs Abs 4.1 (*)    Monocytes Absolute 1.1 (*)    All other components within normal limits  SEDIMENTATION RATE  C-REACTIVE PROTEIN  BASIC METABOLIC PANEL  PROTIME-INR  APTT    Imaging Review No results found. I have personally reviewed and evaluated these images and lab results as part of my medical decision-making.   EKG Interpretation None      MDM   Final diagnoses:  None    65 yo M with a chief complaint of left-sided low back pain. Patient having some sciatica-like symptoms. Patient has no reflexes to left lower extremity and some hyperreflexia to the right.  Concern for possible cord compression. Patient having recent spinal injections some concern for epidural abscess will obtain an MRI of the L-spine.  MRI without acute changes. This was discussed with the neurosurgeon on call, Dr. Christella Noa, feels the patient is appropriate for follow-up as an outpatient. We'll have him follow-up with Dr. Ronnald Ramp. Given a small amount of pain medicine.  2:11 PM:  I have discussed the diagnosis/risks/treatment options with the patient and family and believe the pt to be eligible for discharge home to follow-up with PCP. We also discussed returning to the ED immediately if new or worsening sx occur. We discussed the sx which are most concerning (e.g., sudden worsening pain, fevers, cauda equina) that necessitate immediate return. Medications administered to the patient during their visit and any new prescriptions provided to the patient are listed below.  Medications given  during this visit Medications  sodium chloride 0.9 % bolus 1,000 mL (0 mLs Intravenous Stopped 11/12/15 1149)  HYDROmorphone (DILAUDID) injection 1 mg (1 mg Intravenous Given 11/12/15 0936)  ondansetron (ZOFRAN) injection 4 mg (4 mg Intravenous Given 11/12/15 0925)  diazepam (VALIUM) injection 5 mg (5 mg Intravenous Given 11/12/15 0927)  HYDROmorphone (DILAUDID) injection 1 mg (1 mg Intravenous Given 11/12/15 1150)  ondansetron (ZOFRAN) injection 4 mg (4 mg Intravenous Given 11/12/15 1150)    Discharge Medication List as of 11/12/2015 11:32 AM    START taking these medications   Details  !! oxyCODONE (ROXICODONE) 5 MG immediate release tablet Take 1 tablet (5 mg total) by mouth every 4 (four) hours as needed for severe pain., Starting 11/12/2015, Until Discontinued, Print     !! - Potential duplicate medications found. Please discuss with provider.      The patient appears reasonably screen and/or stabilized for discharge and I doubt any other medical condition or other [facility] requiring further screening,  evaluation, or treatment in the ED at this time prior to discharge.     Deno Etienne, DO 11/12/15 [phone]

## 2015-11-12 NOTE — ED Notes (Addendum)
Per EMS, pt from home, fell 3 weeks ago. Pt being seen by neurosurgeon and given steroid shots Tuesday. Pt reports increased pain since Tuesday. Pt reporting pain to left lower back, radiating down left leg. A&Ox4

## 2015-11-12 NOTE — ED Notes (Signed)
MD at bedside. 

## 2016-11-14 ENCOUNTER — Other Ambulatory Visit (HOSPITAL_COMMUNITY): Payer: Self-pay | Admitting: Family Medicine

## 2016-11-14 DIAGNOSIS — R42 Dizziness and giddiness: Secondary | ICD-10-CM

## 2016-11-16 ENCOUNTER — Ambulatory Visit (HOSPITAL_COMMUNITY)
Admission: EM | Admit: 2016-11-16 | Discharge: 2016-11-16 | Disposition: A | Discharge status: Home / Self Care | Payer: BLUE CROSS/BLUE SHIELD | Attending: Internal Medicine | Admitting: Internal Medicine

## 2016-11-16 ENCOUNTER — Encounter (HOSPITAL_COMMUNITY): Payer: Self-pay | Admitting: Emergency Medicine

## 2016-11-16 DIAGNOSIS — S60351A Superficial foreign body of right thumb, initial encounter: Secondary | ICD-10-CM | POA: Diagnosis not present

## 2016-11-16 DIAGNOSIS — T148XXA Other injury of unspecified body region, initial encounter: Secondary | ICD-10-CM

## 2016-11-16 DIAGNOSIS — M79644 Pain in right finger(s): Secondary | ICD-10-CM | POA: Diagnosis not present

## 2016-11-16 MED ORDER — BACITRACIN ZINC 500 UNIT/GM EX OINT
TOPICAL_OINTMENT | CUTANEOUS | Status: AC
  Filled 2016-11-16: qty 1.8

## 2016-11-16 NOTE — ED Triage Notes (Signed)
Splinter in right thumb.  Reports incident happened last night

## 2016-11-16 NOTE — ED Provider Notes (Signed)
CSN: [id number]     Arrival date & time 11/16/16  1940 History   None    Chief Complaint  Patient presents with  . Hand Pain   (Consider location/radiation/quality/duration/timing/severity/associated sxs/prior Treatment) Patient has a splinter in his right thumb.  His TD is UTD.   The history is provided by the patient.  Hand Pain  This is a new problem. The problem occurs constantly.    Past Medical History:  Diagnosis Date  . Colon polyps   . Dyslipidemia   . Hyperlipidemia   . Hypertension   . LBP (low back pain)   . Prostate cancer ([place])   . Sleep apnea    Past Surgical History:  Procedure Laterality Date  . BACK SURGERY  1996  . PROSTATECTOMY  01/2012   Family History  Problem Relation Age of Onset  . Throat cancer Father   . Cancer Father   . Coronary artery disease Mother    Social History  Substance Use Topics  . Smoking status: Current Every Day Smoker    Packs/day: 0.50    Years: 40.00    Types: Cigarettes  . Smokeless tobacco: Not on file  . Alcohol use No    Review of Systems  Constitutional: Negative.   HENT: Negative.   Eyes: Negative.   Respiratory: Negative.   Cardiovascular: Negative.   Gastrointestinal: Negative.   Endocrine: Negative.   Genitourinary: Negative.   Musculoskeletal: Negative.   Skin: Positive for wound.  Allergic/Immunologic: Negative.   Neurological: Negative.   Hematological: Negative.   Psychiatric/Behavioral: Negative.     Allergies  Patient has no known allergies.  Home Medications   Prior to Admission medications   Medication Sig Start Date End Date Taking? Authorizing Provider  METFORMIN HCL PO Take by mouth.   Yes [provider]  amLODipine (NORVASC) 10 MG tablet Take 10 mg by mouth daily.    [provider]  aspirin 81 MG tablet Take 81 mg by mouth daily.    [provider]  gabapentin (NEURONTIN) 100 MG capsule Take 300 mg by mouth at bedtime. 10/08/15   [provider]  Multiple Vitamins-Minerals (MULTIVITAMIN WITH MINERALS) tablet Take 1 tablet by mouth daily.    [provider]  naproxen (EC NAPROSYN) 500 MG EC tablet Take 500 mg by mouth 2 (two) times daily with a meal.    [provider]  naproxen sodium (ANAPROX) 220 MG tablet Take 440 mg by mouth 2 (two) times daily as needed (pain).    [provider]  oxyCODONE (ROXICODONE) 5 MG immediate release tablet Take 1 tablet (5 mg total) by mouth every 4 (four) hours as needed for severe pain. 11/12/15   Deno Etienne, DO  Oxycodone HCl 10 MG TABS Take 10-20 mg by mouth every 6 (six) hours as needed (pain).  11/03/15   [provider]  simvastatin (ZOCOR) 20 MG tablet Take 20 mg by mouth daily.     [provider]  valsartan-hydrochlorothiazide (DIOVAN-HCT) 320-25 MG tablet Take 1 tablet by mouth daily. 10/19/15   [provider]   Meds Ordered and Administered this Visit  Medications - No data to display  BP 138/78 (BP Location: Right Arm)   Pulse 71   Temp 98.3 F (36.8 C) (Oral)   Resp 18   SpO2 96%  No data found.   Physical Exam  Constitutional: He appears well-developed and well-nourished.  HENT:  Head: Normocephalic and atraumatic.  Eyes: Conjunctivae and EOM are  normal. Pupils are equal, round, and reactive to light.  Neck: Normal range of motion. Neck supple.  Cardiovascular: Normal rate, regular rhythm and normal heart sounds.   Pulmonary/Chest: Effort normal and breath sounds normal.  Skin:  Right thumb with splinter  Nursing note and vitals reviewed.   Urgent Care Course     .Foreign Body Removal Date/Time: 11/16/2016 9:17 PM Performed by: Lysbeth Penner Authorized by: Sherlene Shams  Consent: Verbal consent obtained. Risks and benefits: risks, benefits and alternatives were discussed Consent given by: patient Patient understanding: patient states understanding of the procedure being performed Patient consent: the patient's  understanding of the procedure matches consent given Required items: required blood products, implants, devices, and special equipment available Time out: Immediately prior to procedure a "time out" was called to verify the correct patient, procedure, equipment, support staff and site/side marked as required. Intake: right thumb. Anesthesia: local infiltration  Anesthesia: Local Anesthetic: lidocaine 1% without epinephrine Anesthetic total: 1 mL  Sedation: Patient sedated: no Patient restrained: no Complexity: simple 1 objects recovered. Objects recovered: wood splinter Post-procedure assessment: foreign body removed Patient tolerance: Patient tolerated the procedure well with no immediate complications Comments: Bacitracin ointment applied and bandaid   (including critical care time)  Labs Review Labs Reviewed - No data to display  Imaging Review No results found.   Visual Acuity Review  Right Eye Distance:   Left Eye Distance:   Bilateral Distance:    Right Eye Near:   Left Eye Near:    Bilateral Near:         MDM   1. Splinter in skin   2. Pain of right thumb    Splinter removed Bacitracin ointment and bandaid      Lysbeth Penner, [place] 11/16/16 2120

## 2016-11-16 NOTE — ED Notes (Signed)
Patient reports he has had a tetanus in the last 2 months

## 2016-12-06 ENCOUNTER — Other Ambulatory Visit: Payer: Self-pay | Admitting: Internal Medicine

## 2016-12-06 ENCOUNTER — Other Ambulatory Visit: Payer: Self-pay | Admitting: Neurological Surgery

## 2016-12-06 DIAGNOSIS — M5126 Other intervertebral disc displacement, lumbar region: Secondary | ICD-10-CM

## 2016-12-25 ENCOUNTER — Other Ambulatory Visit: Payer: BLUE CROSS/BLUE SHIELD

## 2016-12-26 ENCOUNTER — Ambulatory Visit (HOSPITAL_COMMUNITY): Payer: BLUE CROSS/BLUE SHIELD

## 2016-12-26 ENCOUNTER — Ambulatory Visit (HOSPITAL_COMMUNITY)
Admission: RE | Admit: 2016-12-26 | Discharge: 2016-12-26 | Disposition: A | Discharge status: Home / Self Care | Payer: BLUE CROSS/BLUE SHIELD | Source: Ambulatory Visit | Attending: Family Medicine | Admitting: Family Medicine

## 2016-12-26 DIAGNOSIS — R42 Dizziness and giddiness: Secondary | ICD-10-CM | POA: Diagnosis not present

## 2016-12-26 DIAGNOSIS — I2510 Atherosclerotic heart disease of native coronary artery without angina pectoris: Secondary | ICD-10-CM | POA: Diagnosis not present

## 2016-12-26 LAB — VAS US CAROTID
LEFT ECA DIAS: -16 cm/s
LEFT VERTEBRAL DIAS: 20 cm/s
Left CCA dist dias: -25 cm/s
Left CCA dist sys: -86 cm/s
Left CCA prox dias: 21 cm/s
Left CCA prox sys: 109 cm/s
Left ICA dist dias: -27 cm/s
Left ICA dist sys: -83 cm/s
Left ICA prox dias: -20 cm/s
Left ICA prox sys: -56 cm/s
RIGHT ECA DIAS: -15 cm/s
RIGHT VERTEBRAL DIAS: 15 cm/s
Right CCA prox dias: 12 cm/s
Right CCA prox sys: 88 cm/s
Right cca dist sys: -96 cm/s

## 2016-12-26 NOTE — Progress Notes (Signed)
*  PRELIMINARY RESULTS* Vascular Ultrasound Carotid Duplex (Doppler) has been completed.   Findings suggest 1-39% internal carotid artery stenosis bilaterally. Vertebral arteries are patent with antegrade flow.  12/26/2016 11:43 AM Maudry Mayhew, BS, RVT, RDCS, RDMS

## 2017-02-17 ENCOUNTER — Telehealth: Payer: Self-pay | Admitting: *Deleted

## 2017-02-17 NOTE — Telephone Encounter (Signed)
-----   Message from Traci R Turner, MD sent at 02/17/2017  1:52 PM EDT ----- Good AHI and compliance.  Continue current CPAP settings. 

## 2017-02-17 NOTE — Telephone Encounter (Signed)
Informed patient of compliance results and verbalized understanding was indicated. Patient understands his events are in normal range. Patient understands his settings will not change. Patient was grateful for the call and thanked me.  

## 2017-09-19 ENCOUNTER — Other Ambulatory Visit: Payer: Self-pay | Admitting: Family Medicine

## 2017-09-19 DIAGNOSIS — M5442 Lumbago with sciatica, left side: Secondary | ICD-10-CM

## 2017-09-23 ENCOUNTER — Other Ambulatory Visit: Payer: BLUE CROSS/BLUE SHIELD

## 2017-09-27 ENCOUNTER — Other Ambulatory Visit: Payer: BLUE CROSS/BLUE SHIELD

## 2017-09-27 DIAGNOSIS — M65321 Trigger finger, right index finger: Secondary | ICD-10-CM | POA: Insufficient documentation

## 2017-09-29 ENCOUNTER — Ambulatory Visit
Admission: RE | Admit: 2017-09-29 | Discharge: 2017-09-29 | Disposition: A | Discharge status: Home / Self Care | Payer: BLUE CROSS/BLUE SHIELD | Source: Ambulatory Visit | Attending: Family Medicine | Admitting: Family Medicine

## 2017-09-29 DIAGNOSIS — M5442 Lumbago with sciatica, left side: Secondary | ICD-10-CM

## 2017-09-29 MED ORDER — GADOBENATE DIMEGLUMINE 529 MG/ML IV SOLN
20.0000 mL | Freq: Once | INTRAVENOUS | Status: AC | PRN
  Administered 2017-09-29: 20 mL via INTRAVENOUS

## 2017-10-10 DIAGNOSIS — R292 Abnormal reflex: Secondary | ICD-10-CM | POA: Insufficient documentation

## 2017-10-11 ENCOUNTER — Other Ambulatory Visit: Payer: Self-pay | Admitting: Neurological Surgery

## 2017-10-11 DIAGNOSIS — M5416 Radiculopathy, lumbar region: Secondary | ICD-10-CM

## 2017-10-20 ENCOUNTER — Other Ambulatory Visit: Payer: Self-pay | Admitting: Neurological Surgery

## 2017-10-20 DIAGNOSIS — M5416 Radiculopathy, lumbar region: Secondary | ICD-10-CM

## 2017-10-23 ENCOUNTER — Ambulatory Visit
Admission: RE | Admit: 2017-10-23 | Discharge: 2017-10-23 | Disposition: A | Discharge status: Home / Self Care | Payer: BLUE CROSS/BLUE SHIELD | Source: Ambulatory Visit | Attending: Neurological Surgery | Admitting: Neurological Surgery

## 2017-10-23 DIAGNOSIS — M5416 Radiculopathy, lumbar region: Secondary | ICD-10-CM

## 2017-10-23 MED ORDER — MEPERIDINE HCL 100 MG/ML IJ SOLN
75.0000 mg | Freq: Once | INTRAMUSCULAR | Status: AC
  Administered 2017-10-23: 75 mg via INTRAMUSCULAR

## 2017-10-23 MED ORDER — ONDANSETRON HCL 4 MG/2ML IJ SOLN
4.0000 mg | Freq: Once | INTRAMUSCULAR | Status: AC
  Administered 2017-10-23: 4 mg via INTRAMUSCULAR

## 2017-10-23 MED ORDER — IOPAMIDOL (ISOVUE-M 200) INJECTION 41%: 15.0000 mL | Freq: Once | INTRAMUSCULAR | Status: DC

## 2017-10-23 MED ORDER — IOPAMIDOL (ISOVUE-M 300) INJECTION 61%
10.0000 mL | Freq: Once | INTRAMUSCULAR | Status: AC | PRN
Start: 2017-10-23 — End: 2017-10-23
  Administered 2017-10-23: 10 mL via INTRATHECAL

## 2017-10-23 MED ORDER — DIAZEPAM 5 MG PO TABS
10.0000 mg | ORAL_TABLET | Freq: Once | ORAL | Status: AC
  Administered 2017-10-23: 10 mg via ORAL

## 2017-10-23 NOTE — Discharge Instructions (Signed)

## 2017-10-24 ENCOUNTER — Other Ambulatory Visit: Payer: Self-pay | Admitting: Neurological Surgery

## 2017-10-31 ENCOUNTER — Encounter (HOSPITAL_COMMUNITY): Payer: Self-pay

## 2017-10-31 NOTE — Pre-Procedure Instructions (Signed)
James Lee  10/31/2017      [id number] [address], [place] [address] [place] [address] Phone: [phone] Fax: [phone]    Your procedure is scheduled on 11/10/2017.  Report to [facility] Admitting at 1150 A.M.  Call this number if you have problems the morning of surgery:  [phone]   Remember:  Nothing to eat or drink after midnight the night before your surgery.  Continue all medications as directed by your physician except follow these medication instructions before surgery below    Take these medicines the morning of surgery with A SIP OF WATER: Amlodipine (Norvasc) nebivolol (Bystolic) Tramadol (Ultram) - if needed  7 days prior to surgery STOP taking any Aspirin (unless otherwise instructed by your surgeon), Aleve, Naproxen, Ibuprofen, Motrin, Advil, Goody's, BC's, all herbal medications, fish oil, and all vitamins  Follow your doctors instructions regarding your Aspirin.  If no instructions were given by your doctor, then you will need to call the prescribing office office to get instructions.   WHAT DO I DO ABOUT MY DIABETES MEDICATION?  Marland Kitchen Do not take oral diabetes medicines (pills) the morning of surgery. - DO NOT take your metformin the morning of your surgery.   How to Manage Your Diabetes Before and After Surgery  Why is it important to control my blood sugar before and after surgery? . Improving blood sugar levels before and after surgery helps healing and can limit problems. . A way of improving blood sugar control is eating a healthy diet by: o  Eating less sugar and carbohydrates o  Increasing activity/exercise o  Talking with your doctor about reaching your blood sugar goals . High blood sugars (greater than 180 mg/dL) can raise your risk of infections and slow your recovery, so you will need to focus on controlling your diabetes during the weeks before surgery. . Make sure that the doctor who  takes care of your diabetes knows about your planned surgery including the date and location.  How do I manage my blood sugar before surgery? . Check your blood sugar at least 4 times a day, starting 2 days before surgery, to make sure that the level is not too high or low. o Check your blood sugar the morning of your surgery when you wake up and every 2 hours until you get to the Short Stay unit. . If your blood sugar is less than 70 mg/dL, you will need to treat for low blood sugar: o Do not take insulin. o Treat a low blood sugar (less than 70 mg/dL) with  cup of clear juice (cranberry or apple), 4 glucose tablets, OR glucose gel. Recheck blood sugar in 15 minutes after treatment (to make sure it is greater than 70 mg/dL). If your blood sugar is not greater than 70 mg/dL on recheck, call [phone] o  for further instructions. . Report your blood sugar to the short stay nurse when you get to Short Stay.  . If you are admitted to the hospital after surgery: o Your blood sugar will be checked by the staff and you will probably be given insulin after surgery (instead of oral diabetes medicines) to make sure you have good blood sugar levels. o The goal for blood sugar control after surgery is 80-180 mg/dL.     Do not wear jewelry, make-up or nail polish.  Do not wear lotions, powders, or perfumes, or deodorant.  Do not shave 48 hours prior to  surgery.  Do not bring valuables to the hospital.  [facility] is not responsible for any belongings or valuables.  Hearing aids, eyeglasses, contacts, dentures or bridgework may not be worn into surgery.  Leave your suitcase in the car.  After surgery it may be brought to your room.  For patients admitted to the hospital, discharge time will be determined by your treatment team.  Patients discharged the day of surgery will not be allowed to drive home.   Name and phone number of your driver:    Special instructions:   [facility]- Preparing  For Surgery  Before surgery, you can play an important role. Because skin is not sterile, your skin needs to be as free of germs as possible. You can reduce the number of germs on your skin by washing with CHG (chlorahexidine gluconate) Soap before surgery.  CHG is an antiseptic cleaner which kills germs and bonds with the skin to continue killing germs even after washing.    Oral Hygiene is also important to reduce your risk of infection.  Remember - BRUSH YOUR TEETH THE MORNING OF SURGERY WITH YOUR REGULAR TOOTHPASTE  Please do not use if you have an allergy to CHG or antibacterial soaps. If your skin becomes reddened/irritated stop using the CHG.  Do not shave (including legs and underarms) for at least 48 hours prior to first CHG shower. It is OK to shave your face.  Please follow these instructions carefully.   1. Shower the NIGHT BEFORE SURGERY and the MORNING OF SURGERY with CHG.   2. If you chose to wash your hair, wash your hair first as usual with your normal shampoo.  3. After you shampoo, rinse your hair and body thoroughly to remove the shampoo.  4. Use CHG as you would any other liquid soap. You can apply CHG directly to the skin and wash gently with a scrungie or a clean washcloth.   5. Apply the CHG Soap to your body ONLY FROM THE NECK DOWN.  Do not use on open wounds or open sores. Avoid contact with your eyes, ears, mouth and genitals (private parts). Wash Face and genitals (private parts)  with your normal soap.  6. Wash thoroughly, paying special attention to the area where your surgery will be performed.  7. Thoroughly rinse your body with warm water from the neck down.  8. DO NOT shower/wash with your normal soap after using and rinsing off the CHG Soap.  9. Pat yourself dry with a CLEAN TOWEL.  10. Wear CLEAN PAJAMAS to bed the night before surgery, wear comfortable clothes the morning of surgery  11. Place CLEAN SHEETS on your bed the night of your first shower  and DO NOT SLEEP WITH PETS.    Day of Surgery:  Do not apply any deodorants/lotions.  Please wear clean clothes to the hospital/surgery center.   Remember to brush your teeth WITH YOUR REGULAR TOOTHPASTE.    Please read over the following fact sheets that you were given.

## 2017-11-01 ENCOUNTER — Other Ambulatory Visit: Payer: Self-pay

## 2017-11-01 ENCOUNTER — Encounter (HOSPITAL_COMMUNITY)
Admission: RE | Admit: 2017-11-01 | Discharge: 2017-11-01 | Disposition: A | Discharge status: Home / Self Care | Payer: BLUE CROSS/BLUE SHIELD | Source: Ambulatory Visit | Attending: Neurological Surgery | Admitting: Neurological Surgery

## 2017-11-01 ENCOUNTER — Encounter (HOSPITAL_COMMUNITY): Payer: Self-pay

## 2017-11-01 DIAGNOSIS — Z01810 Encounter for preprocedural cardiovascular examination: Secondary | ICD-10-CM | POA: Diagnosis not present

## 2017-11-01 DIAGNOSIS — Z01818 Encounter for other preprocedural examination: Secondary | ICD-10-CM | POA: Diagnosis present

## 2017-11-01 DIAGNOSIS — M542 Cervicalgia: Secondary | ICD-10-CM | POA: Diagnosis not present

## 2017-11-01 DIAGNOSIS — Z01812 Encounter for preprocedural laboratory examination: Secondary | ICD-10-CM | POA: Diagnosis present

## 2017-11-01 DIAGNOSIS — I4510 Unspecified right bundle-branch block: Secondary | ICD-10-CM | POA: Diagnosis not present

## 2017-11-01 HISTORY — DX: Type 2 diabetes mellitus without complications: E11.9

## 2017-11-01 LAB — CBC WITH DIFFERENTIAL/PLATELET
Abs Immature Granulocytes: 0 10*3/uL (ref 0.0–0.1)
BASOS PCT: 1 %
Basophils Absolute: 0.1 10*3/uL (ref 0.0–0.1)
EOS ABS: 0.3 10*3/uL (ref 0.0–0.7)
EOS PCT: 3 %
HEMATOCRIT: 47.4 % (ref 39.0–52.0)
Hemoglobin: 15.9 g/dL (ref 13.0–17.0)
IMMATURE GRANULOCYTES: 0 %
LYMPHS ABS: 2.7 10*3/uL (ref 0.7–4.0)
Lymphocytes Relative: 24 %
MCH: 30.6 pg (ref 26.0–34.0)
MCHC: 33.5 g/dL (ref 30.0–36.0)
MCV: 91.3 fL (ref 78.0–100.0)
MONOS PCT: 7 %
Monocytes Absolute: 0.7 10*3/uL (ref 0.1–1.0)
NEUTROS PCT: 65 %
Neutro Abs: 7.3 10*3/uL (ref 1.7–7.7)
PLATELETS: 262 10*3/uL (ref 150–400)
RBC: 5.19 MIL/uL (ref 4.22–5.81)
RDW: 12.6 % (ref 11.5–15.5)
WBC: 11.1 10*3/uL — AB (ref 4.0–10.5)

## 2017-11-01 LAB — BASIC METABOLIC PANEL
Anion gap: 7 (ref 5–15)
BUN: 8 mg/dL (ref 6–20)
CALCIUM: 9.7 mg/dL (ref 8.9–10.3)
CHLORIDE: 108 mmol/L (ref 101–111)
CO2: 28 mmol/L (ref 22–32)
CREATININE: 0.97 mg/dL (ref 0.61–1.24)
GFR calc non Af Amer: >60 mL/min (ref >60)
Glucose, Bld: 100 mg/dL — ABNORMAL HIGH (ref 65–99)
Potassium: 4.1 mmol/L (ref 3.5–5.1)
SODIUM: 143 mmol/L (ref 135–145)

## 2017-11-01 LAB — GLUCOSE, CAPILLARY: Glucose-Capillary: 116 mg/dL — ABNORMAL HIGH (ref 65–99)

## 2017-11-01 LAB — HEMOGLOBIN A1C
Hgb A1c MFr Bld: 6.2 % — ABNORMAL HIGH (ref 4.8–5.6)
Mean Plasma Glucose: 131.24 mg/dL

## 2017-11-01 LAB — TYPE AND SCREEN
ABO/RH(D): A NEG
ANTIBODY SCREEN: NEGATIVE

## 2017-11-01 LAB — PROTIME-INR
INR: 0.95
Prothrombin Time: 12.6 seconds (ref 11.4–15.2)

## 2017-11-01 LAB — SURGICAL PCR SCREEN
MRSA, PCR: NEGATIVE
STAPHYLOCOCCUS AUREUS: NEGATIVE

## 2017-11-01 LAB — ABO/RH: ABO/RH(D): A NEG

## 2017-11-01 NOTE — Progress Notes (Signed)
PCP - Dr. Jearld Pies Cardiologist - Saw Dr. Radford Pax once for a stress test that was low risk, patient has not had to follow up with a cardiologist since.  Chest x-ray - 11/01/2017 EKG - 11/01/2017 Stress Test - 09/17/2014 ECHO - patient denies Cardiac Cath - patient denies  Sleep Study - yes, patient is not sure when but thinks it was a couple of years ago. CPAP - yes, patient states he has to have it to sleep; pressure setting 13  Fasting Blood Sugar - patient unsure Checks Blood Sugar 0 times a day  Aspirin Instructions: Patient instructed to stop ASA 7 days prior to surgery  Anesthesia review: yes, abnormal EKG  Patient denies shortness of breath, fever, cough and chest pain at PAT appointment   Patient verbalized understanding of instructions that were given to them at the PAT appointment. Patient was also instructed that they will need to review over the PAT instructions again at home before surgery.

## 2017-11-02 NOTE — Progress Notes (Signed)
Anesthesia Chart Review:   Case:  [id number] Date/Time:  11/10/17 1335   Procedure:  Posterior cervical fusion with lateral mass fixation C3-7, cervical laminectomy C3-7 (N/A )   Anesthesia type:  General   Pre-op diagnosis:  Cervicalgia   Location:  [facility] OR ROOM 30 / [facility]   Surgeon:  Eustace Moore, MD      DISCUSSION: - Pt is a 66 year old male with hx HTN, DM, OSA  VS: BP (!) 159/83   Pulse 72   Temp 36.6 C   Resp 20   Ht 5\' 11"  (1.803 m)   Wt 266 lb (120.7 kg)   SpO2 98%   BMI 37.10 kg/m    PROVIDERS: PCP is Aura Dials, MD  Saw cardiologist Fransico Him, MD in 2016 for evaluation of coronary calcification on CT.  Stress test showed fixed defect thought to be diaphragmatic attenuation; results below.   LABS: Labs reviewed: Acceptable for surgery.   (all labs ordered are listed, but only abnormal results are displayed)  Labs Reviewed  GLUCOSE, CAPILLARY - Abnormal; Notable for the following components:      Result Value   Glucose-Capillary 116 (*)    All other components within normal limits  BASIC METABOLIC PANEL - Abnormal; Notable for the following components:   Glucose, Bld 100 (*)    All other components within normal limits  CBC WITH DIFFERENTIAL/PLATELET - Abnormal; Notable for the following components:   WBC 11.1 (*)    All other components within normal limits  HEMOGLOBIN A1C - Abnormal; Notable for the following components:   Hgb A1c MFr Bld 6.2 (*)    All other components within normal limits  SURGICAL PCR SCREEN  PROTIME-INR  TYPE AND SCREEN  ABO/RH     IMAGES:  CXR 11/01/17: Mild bilateral peribronchial cuffing. Mild bronchitis can't be excluded. No acute cardiopulmonary disease otherwise noted - Pt denied SOB, fever, cough at pre-admission testing   EKG 11/01/17: Sinus rhythm with Fusion complexes. RBBB.  - RBBB is new. I routed EKG to PCP for follow up.    CV:  Carotid duplex 12/26/16:  - Findings suggest 1-39% internal carotid  artery stenosis bilaterally.  - Vertebral arteries are patent with antegrade flow.  Nuclear stress test 09/17/14:  - Intermediate risk stress nuclear study with small in size, moderate in intensity, fixed defects in the basal and apical inferior wall.  No ischemia noted. This most likely represents diaphragmatic attenuation.  The patient did have a hypotensive response during exercise.  Initial BP was 157/44mmHg.  This dropped to 118/27mmHg in stage 3 but went up to 214/14mmHg in immediate recovery so question whether lower BP readings during exercise were accurate. - LV Ejection Fraction: 56%.  LV Wall Motion:  NL LV Function; NL Wall Motion   Past Medical History:  Diagnosis Date  . Colon polyps   . Diabetes mellitus without complication ([place])   . Dyslipidemia   . Hyperlipidemia   . Hypertension   . LBP (low back pain)   . Prostate cancer ([place])   . Sleep apnea     Past Surgical History:  Procedure Laterality Date  . BACK SURGERY  1996  . MENISCUS REPAIR Right   . PROSTATECTOMY  01/2012  . TONSILLECTOMY    . TOOTH EXTRACTION      MEDICATIONS: . amLODipine (NORVASC) 10 MG tablet  . aspirin 81 MG tablet  . gabapentin (NEURONTIN) 300 MG capsule  . metFORMIN (GLUCOPHAGE-XR) 500 MG 24 hr  tablet  . Multiple Vitamins-Minerals (MULTIVITAMIN WITH MINERALS) tablet  . naproxen (EC NAPROSYN) 500 MG EC tablet  . naproxen sodium (ANAPROX) 220 MG tablet  . nebivolol (BYSTOLIC) 5 MG tablet  . simvastatin (ZOCOR) 40 MG tablet  . traMADol (ULTRAM) 50 MG tablet  . valsartan-hydrochlorothiazide (DIOVAN-HCT) 320-25 MG tablet   No current facility-administered medications for this encounter.    - Pt to stop ASA 7 days before surgery   If no changes, I anticipate pt can proceed with surgery as scheduled.   Willeen Cass, FNP-BC [facility] Short Stay Surgical Center/Anesthesiology Phone: [phone] 11/02/2017 1:10 PM

## 2017-11-09 MED ORDER — DEXTROSE 5 % IV SOLN
3.0000 g | INTRAVENOUS | Status: AC
  Administered 2017-11-10: 3 g via INTRAVENOUS
  Filled 2017-11-09: qty 3

## 2017-11-10 ENCOUNTER — Encounter (HOSPITAL_COMMUNITY): Payer: Self-pay | Admitting: *Deleted

## 2017-11-10 ENCOUNTER — Inpatient Hospital Stay (HOSPITAL_COMMUNITY): Payer: BLUE CROSS/BLUE SHIELD

## 2017-11-10 ENCOUNTER — Encounter (HOSPITAL_COMMUNITY)
Admission: RE | Disposition: A | Discharge status: Home / Self Care | Payer: Self-pay | Source: Ambulatory Visit | Attending: Neurological Surgery

## 2017-11-10 ENCOUNTER — Inpatient Hospital Stay (HOSPITAL_COMMUNITY): Payer: BLUE CROSS/BLUE SHIELD | Admitting: Certified Registered"

## 2017-11-10 ENCOUNTER — Inpatient Hospital Stay (HOSPITAL_COMMUNITY): Payer: BLUE CROSS/BLUE SHIELD | Admitting: Emergency Medicine

## 2017-11-10 ENCOUNTER — Inpatient Hospital Stay (HOSPITAL_COMMUNITY)
Admission: RE | Admit: 2017-11-10 | Discharge: 2017-11-16 | DRG: 472 | Disposition: A | Discharge status: Home / Self Care | Payer: BLUE CROSS/BLUE SHIELD | Source: Ambulatory Visit | Attending: Neurological Surgery | Admitting: Neurological Surgery

## 2017-11-10 DIAGNOSIS — M4802 Spinal stenosis, cervical region: Secondary | ICD-10-CM | POA: Diagnosis present

## 2017-11-10 DIAGNOSIS — G4730 Sleep apnea, unspecified: Secondary | ICD-10-CM | POA: Diagnosis present

## 2017-11-10 DIAGNOSIS — F17210 Nicotine dependence, cigarettes, uncomplicated: Secondary | ICD-10-CM | POA: Diagnosis present

## 2017-11-10 DIAGNOSIS — M4322 Fusion of spine, cervical region: Secondary | ICD-10-CM | POA: Diagnosis present

## 2017-11-10 DIAGNOSIS — Z7984 Long term (current) use of oral hypoglycemic drugs: Secondary | ICD-10-CM

## 2017-11-10 DIAGNOSIS — Z419 Encounter for procedure for purposes other than remedying health state, unspecified: Secondary | ICD-10-CM

## 2017-11-10 DIAGNOSIS — E785 Hyperlipidemia, unspecified: Secondary | ICD-10-CM | POA: Diagnosis present

## 2017-11-10 DIAGNOSIS — Z79899 Other long term (current) drug therapy: Secondary | ICD-10-CM

## 2017-11-10 DIAGNOSIS — M4712 Other spondylosis with myelopathy, cervical region: Principal | ICD-10-CM | POA: Diagnosis present

## 2017-11-10 DIAGNOSIS — Z8546 Personal history of malignant neoplasm of prostate: Secondary | ICD-10-CM

## 2017-11-10 DIAGNOSIS — Z7982 Long term (current) use of aspirin: Secondary | ICD-10-CM

## 2017-11-10 DIAGNOSIS — E119 Type 2 diabetes mellitus without complications: Secondary | ICD-10-CM | POA: Diagnosis present

## 2017-11-10 DIAGNOSIS — K567 Ileus, unspecified: Secondary | ICD-10-CM | POA: Diagnosis not present

## 2017-11-10 DIAGNOSIS — I10 Essential (primary) hypertension: Secondary | ICD-10-CM | POA: Diagnosis present

## 2017-11-10 HISTORY — PX: POSTERIOR CERVICAL FUSION/FORAMINOTOMY: SHX5038

## 2017-11-10 LAB — GLUCOSE, CAPILLARY
Glucose-Capillary: 108 mg/dL — ABNORMAL HIGH (ref 65–99)
Glucose-Capillary: 150 mg/dL — ABNORMAL HIGH (ref 65–99)
Glucose-Capillary: 294 mg/dL — ABNORMAL HIGH (ref 65–99)

## 2017-11-10 SURGERY — POSTERIOR CERVICAL FUSION/FORAMINOTOMY LEVEL 4
Anesthesia: General

## 2017-11-10 MED ORDER — MIDAZOLAM HCL 2 MG/2ML IJ SOLN
INTRAMUSCULAR | Status: AC
  Filled 2017-11-10: qty 2

## 2017-11-10 MED ORDER — LIDOCAINE 2% (20 MG/ML) 5 ML SYRINGE
INTRAMUSCULAR | Status: AC
  Filled 2017-11-10: qty 5

## 2017-11-10 MED ORDER — MIDAZOLAM HCL 2 MG/2ML IJ SOLN
1.0000 mg | Freq: Once | INTRAMUSCULAR | Status: AC
  Administered 2017-11-10: 1 mg via INTRAVENOUS

## 2017-11-10 MED ORDER — PROPOFOL 10 MG/ML IV BOLUS
INTRAVENOUS | Status: AC
  Filled 2017-11-10: qty 20

## 2017-11-10 MED ORDER — ARTIFICIAL TEARS OPHTHALMIC OINT
TOPICAL_OINTMENT | OPHTHALMIC | Status: AC
  Filled 2017-11-10: qty 3.5

## 2017-11-10 MED ORDER — PROPOFOL 10 MG/ML IV BOLUS
INTRAVENOUS | Status: AC
Start: 2017-11-10 — End: ?
  Filled 2017-11-10: qty 20

## 2017-11-10 MED ORDER — THROMBIN 20000 UNITS EX SOLR
CUTANEOUS | Status: AC
  Filled 2017-11-10: qty 20000

## 2017-11-10 MED ORDER — DEXAMETHASONE 4 MG PO TABS
4.0000 mg | ORAL_TABLET | Freq: Four times a day (QID) | ORAL | Status: DC
  Administered 2017-11-11 – 2017-11-13 (×9): 4 mg via ORAL
  Filled 2017-11-10 (×9): qty 1

## 2017-11-10 MED ORDER — DEXAMETHASONE SODIUM PHOSPHATE 10 MG/ML IJ SOLN
10.0000 mg | INTRAMUSCULAR | Status: AC
  Administered 2017-11-10: 10 mg via INTRAVENOUS

## 2017-11-10 MED ORDER — GELATIN ABSORBABLE MT POWD
OROMUCOSAL | Status: DC | PRN
  Administered 2017-11-10: 15:00:00 via TOPICAL

## 2017-11-10 MED ORDER — ONDANSETRON HCL 4 MG/2ML IJ SOLN: 4.0000 mg | Freq: Four times a day (QID) | INTRAMUSCULAR | Status: DC | PRN

## 2017-11-10 MED ORDER — AMLODIPINE BESYLATE 10 MG PO TABS
10.0000 mg | ORAL_TABLET | Freq: Every day | ORAL | Status: DC
  Administered 2017-11-11 – 2017-11-16 (×6): 10 mg via ORAL
  Filled 2017-11-10 (×6): qty 1

## 2017-11-10 MED ORDER — ACETAMINOPHEN 650 MG RE SUPP: 650.0000 mg | RECTAL | Status: DC | PRN

## 2017-11-10 MED ORDER — SUGAMMADEX SODIUM 500 MG/5ML IV SOLN
INTRAVENOUS | Status: AC
  Filled 2017-11-10: qty 5

## 2017-11-10 MED ORDER — BACITRACIN ZINC 500 UNIT/GM EX OINT
TOPICAL_OINTMENT | CUTANEOUS | Status: AC
  Filled 2017-11-10: qty 28.35

## 2017-11-10 MED ORDER — FENTANYL CITRATE (PF) 100 MCG/2ML IJ SOLN
25.0000 ug | INTRAMUSCULAR | Status: DC | PRN
  Administered 2017-11-10 (×3): 50 ug via INTRAVENOUS

## 2017-11-10 MED ORDER — SODIUM CHLORIDE 0.9 % IV SOLN
INTRAVENOUS | Status: DC | PRN
  Administered 2017-11-10: 15:00:00

## 2017-11-10 MED ORDER — ROCURONIUM BROMIDE 50 MG/5ML IV SOLN
INTRAVENOUS | Status: AC
  Filled 2017-11-10: qty 1

## 2017-11-10 MED ORDER — VANCOMYCIN HCL 1000 MG IV SOLR
INTRAVENOUS | Status: DC | PRN
  Administered 2017-11-10: 1000 mg via TOPICAL

## 2017-11-10 MED ORDER — FENTANYL CITRATE (PF) 250 MCG/5ML IJ SOLN
INTRAMUSCULAR | Status: AC
  Filled 2017-11-10: qty 5

## 2017-11-10 MED ORDER — CHLORHEXIDINE GLUCONATE CLOTH 2 % EX PADS: 6.0000 | MEDICATED_PAD | Freq: Once | CUTANEOUS | Status: DC

## 2017-11-10 MED ORDER — PHENOL 1.4 % MT LIQD: 1.0000 | OROMUCOSAL | Status: DC | PRN

## 2017-11-10 MED ORDER — ROCURONIUM BROMIDE 100 MG/10ML IV SOLN
INTRAVENOUS | Status: DC | PRN
  Administered 2017-11-10 (×3): 50 mg via INTRAVENOUS

## 2017-11-10 MED ORDER — HYDROCHLOROTHIAZIDE 25 MG PO TABS
25.0000 mg | ORAL_TABLET | Freq: Every day | ORAL | Status: DC
  Administered 2017-11-11 – 2017-11-16 (×6): 25 mg via ORAL
  Filled 2017-11-10 (×6): qty 1

## 2017-11-10 MED ORDER — OXYCODONE HCL 5 MG/5ML PO SOLN: 5.0000 mg | Freq: Once | ORAL | Status: AC | PRN

## 2017-11-10 MED ORDER — SUCCINYLCHOLINE CHLORIDE 200 MG/10ML IV SOSY
PREFILLED_SYRINGE | INTRAVENOUS | Status: AC
  Filled 2017-11-10: qty 10

## 2017-11-10 MED ORDER — CEFAZOLIN SODIUM-DEXTROSE 2-4 GM/100ML-% IV SOLN
2.0000 g | Freq: Three times a day (TID) | INTRAVENOUS | Status: AC
  Administered 2017-11-10 – 2017-11-11 (×2): 2 g via INTRAVENOUS
  Filled 2017-11-10 (×2): qty 100

## 2017-11-10 MED ORDER — METHOCARBAMOL 500 MG PO TABS
500.0000 mg | ORAL_TABLET | Freq: Four times a day (QID) | ORAL | Status: DC | PRN
  Administered 2017-11-10 – 2017-11-16 (×15): 500 mg via ORAL
  Filled 2017-11-10 (×16): qty 1

## 2017-11-10 MED ORDER — SODIUM CHLORIDE 0.9% FLUSH
3.0000 mL | Freq: Two times a day (BID) | INTRAVENOUS | Status: DC
  Administered 2017-11-11 – 2017-11-16 (×9): 3 mL via INTRAVENOUS

## 2017-11-10 MED ORDER — THROMBIN 5000 UNITS EX SOLR
CUTANEOUS | Status: AC
  Filled 2017-11-10: qty 5000

## 2017-11-10 MED ORDER — GABAPENTIN 300 MG PO CAPS
300.0000 mg | ORAL_CAPSULE | Freq: Every day | ORAL | Status: DC
  Administered 2017-11-10 – 2017-11-15 (×6): 300 mg via ORAL
  Filled 2017-11-10 (×6): qty 1

## 2017-11-10 MED ORDER — NEBIVOLOL HCL 5 MG PO TABS
5.0000 mg | ORAL_TABLET | Freq: Every day | ORAL | Status: DC
  Administered 2017-11-11 – 2017-11-16 (×6): 5 mg via ORAL
  Filled 2017-11-10 (×6): qty 1

## 2017-11-10 MED ORDER — ONDANSETRON HCL 4 MG/2ML IJ SOLN
INTRAMUSCULAR | Status: AC
  Filled 2017-11-10: qty 2

## 2017-11-10 MED ORDER — VANCOMYCIN HCL 1000 MG IV SOLR
INTRAVENOUS | Status: AC
  Filled 2017-11-10: qty 1000

## 2017-11-10 MED ORDER — SENNA 8.6 MG PO TABS
1.0000 | ORAL_TABLET | Freq: Two times a day (BID) | ORAL | Status: DC
  Administered 2017-11-10 – 2017-11-16 (×12): 8.6 mg via ORAL
  Filled 2017-11-10 (×12): qty 1

## 2017-11-10 MED ORDER — DEXMEDETOMIDINE HCL IN NACL 200 MCG/50ML IV SOLN
INTRAVENOUS | Status: DC | PRN
  Administered 2017-11-10 (×2): 4 ug via INTRAVENOUS
  Administered 2017-11-10: 8 ug via INTRAVENOUS
  Administered 2017-11-10: 4 ug via INTRAVENOUS

## 2017-11-10 MED ORDER — OXYCODONE HCL 5 MG PO TABS
ORAL_TABLET | ORAL | Status: AC
  Filled 2017-11-10: qty 1

## 2017-11-10 MED ORDER — METFORMIN HCL ER 500 MG PO TB24
1000.0000 mg | ORAL_TABLET | Freq: Two times a day (BID) | ORAL | Status: DC
  Administered 2017-11-11 – 2017-11-16 (×11): 1000 mg via ORAL
  Filled 2017-11-10 (×11): qty 2

## 2017-11-10 MED ORDER — INSULIN ASPART 100 UNIT/ML ~~LOC~~ SOLN
0.0000 [IU] | Freq: Three times a day (TID) | SUBCUTANEOUS | Status: DC
  Administered 2017-11-11: 15 [IU] via SUBCUTANEOUS
  Administered 2017-11-11 (×2): 5 [IU] via SUBCUTANEOUS
  Administered 2017-11-12 (×2): 11 [IU] via SUBCUTANEOUS
  Administered 2017-11-12: 3 [IU] via SUBCUTANEOUS
  Administered 2017-11-13: 5 [IU] via SUBCUTANEOUS
  Administered 2017-11-13 (×2): 3 [IU] via SUBCUTANEOUS
  Administered 2017-11-14: 2 [IU] via SUBCUTANEOUS
  Administered 2017-11-14: 3 [IU] via SUBCUTANEOUS
  Administered 2017-11-15: 2 [IU] via SUBCUTANEOUS
  Administered 2017-11-16: 3 [IU] via SUBCUTANEOUS

## 2017-11-10 MED ORDER — ONDANSETRON HCL 4 MG PO TABS: 4.0000 mg | ORAL_TABLET | Freq: Four times a day (QID) | ORAL | Status: DC | PRN

## 2017-11-10 MED ORDER — ARTIFICIAL TEARS OPHTHALMIC OINT
TOPICAL_OINTMENT | OPHTHALMIC | Status: DC | PRN
  Administered 2017-11-10: 1 via OPHTHALMIC

## 2017-11-10 MED ORDER — METHOCARBAMOL 1000 MG/10ML IJ SOLN
500.0000 mg | Freq: Four times a day (QID) | INTRAMUSCULAR | Status: DC | PRN
  Filled 2017-11-10: qty 5

## 2017-11-10 MED ORDER — SODIUM CHLORIDE 0.9 % IV SOLN: 250.0000 mL | INTRAVENOUS | Status: DC

## 2017-11-10 MED ORDER — MENTHOL 3 MG MT LOZG: 1.0000 | LOZENGE | OROMUCOSAL | Status: DC | PRN

## 2017-11-10 MED ORDER — OXYCODONE HCL 5 MG PO TABS
10.0000 mg | ORAL_TABLET | ORAL | Status: DC | PRN
  Administered 2017-11-10 – 2017-11-16 (×26): 10 mg via ORAL
  Filled 2017-11-10 (×26): qty 2

## 2017-11-10 MED ORDER — POTASSIUM CHLORIDE IN NACL 20-0.9 MEQ/L-% IV SOLN
INTRAVENOUS | Status: DC
  Administered 2017-11-10: 20:00:00 via INTRAVENOUS
  Filled 2017-11-10: qty 1000

## 2017-11-10 MED ORDER — LACTATED RINGERS IV SOLN
INTRAVENOUS | Status: DC
  Administered 2017-11-10 (×2): via INTRAVENOUS

## 2017-11-10 MED ORDER — METHOCARBAMOL 1000 MG/10ML IJ SOLN
750.0000 mg | INTRAVENOUS | Status: AC
  Administered 2017-11-10: 750 mg via INTRAVENOUS
  Filled 2017-11-10: qty 7.5

## 2017-11-10 MED ORDER — SUGAMMADEX SODIUM 500 MG/5ML IV SOLN
INTRAVENOUS | Status: DC | PRN
  Administered 2017-11-10: 250 mg via INTRAVENOUS

## 2017-11-10 MED ORDER — MIDAZOLAM HCL 5 MG/5ML IJ SOLN
INTRAMUSCULAR | Status: DC | PRN
  Administered 2017-11-10: 2 mg via INTRAVENOUS

## 2017-11-10 MED ORDER — HYDROMORPHONE HCL 1 MG/ML IJ SOLN
0.5000 mg | INTRAMUSCULAR | Status: DC | PRN
  Administered 2017-11-10 – 2017-11-15 (×18): 0.5 mg via INTRAVENOUS
  Filled 2017-11-10 (×18): qty 0.5

## 2017-11-10 MED ORDER — PROMETHAZINE HCL 25 MG/ML IJ SOLN: 6.2500 mg | INTRAMUSCULAR | Status: DC | PRN

## 2017-11-10 MED ORDER — BUPIVACAINE HCL (PF) 0.25 % IJ SOLN
INTRAMUSCULAR | Status: DC | PRN
  Administered 2017-11-10: 4 mL

## 2017-11-10 MED ORDER — BUPIVACAINE HCL (PF) 0.25 % IJ SOLN
INTRAMUSCULAR | Status: AC
  Filled 2017-11-10: qty 30

## 2017-11-10 MED ORDER — 0.9 % SODIUM CHLORIDE (POUR BTL) OPTIME
TOPICAL | Status: DC | PRN
  Administered 2017-11-10: 1000 mL

## 2017-11-10 MED ORDER — ONDANSETRON HCL 4 MG/2ML IJ SOLN
INTRAMUSCULAR | Status: DC | PRN
  Administered 2017-11-10: 4 mg via INTRAVENOUS

## 2017-11-10 MED ORDER — BACITRACIN ZINC 500 UNIT/GM EX OINT
TOPICAL_OINTMENT | CUTANEOUS | Status: DC | PRN
  Administered 2017-11-10: 1 via TOPICAL

## 2017-11-10 MED ORDER — FENTANYL CITRATE (PF) 100 MCG/2ML IJ SOLN
INTRAMUSCULAR | Status: AC
  Filled 2017-11-10: qty 2

## 2017-11-10 MED ORDER — OXYCODONE HCL 5 MG PO TABS
5.0000 mg | ORAL_TABLET | Freq: Once | ORAL | Status: AC | PRN
  Administered 2017-11-10: 5 mg via ORAL

## 2017-11-10 MED ORDER — PROPOFOL 10 MG/ML IV BOLUS
INTRAVENOUS | Status: DC | PRN
  Administered 2017-11-10: 50 mg via INTRAVENOUS
  Administered 2017-11-10: 100 mg via INTRAVENOUS

## 2017-11-10 MED ORDER — VALSARTAN-HYDROCHLOROTHIAZIDE 320-25 MG PO TABS: 1.0000 | ORAL_TABLET | Freq: Every day | ORAL | Status: DC

## 2017-11-10 MED ORDER — LIDOCAINE HCL (CARDIAC) PF 100 MG/5ML IV SOSY
PREFILLED_SYRINGE | INTRAVENOUS | Status: DC | PRN
  Administered 2017-11-10: 100 mg via INTRAVENOUS

## 2017-11-10 MED ORDER — ACETAMINOPHEN 325 MG PO TABS: 650.0000 mg | ORAL_TABLET | ORAL | Status: DC | PRN

## 2017-11-10 MED ORDER — IRBESARTAN 300 MG PO TABS
300.0000 mg | ORAL_TABLET | Freq: Every day | ORAL | Status: DC
  Administered 2017-11-11 – 2017-11-16 (×6): 300 mg via ORAL
  Filled 2017-11-10 (×6): qty 1

## 2017-11-10 MED ORDER — FENTANYL CITRATE (PF) 100 MCG/2ML IJ SOLN
INTRAMUSCULAR | Status: DC | PRN
  Administered 2017-11-10: 100 ug via INTRAVENOUS
  Administered 2017-11-10 (×2): 50 ug via INTRAVENOUS
  Administered 2017-11-10: 150 ug via INTRAVENOUS
  Administered 2017-11-10: 50 ug via INTRAVENOUS
  Administered 2017-11-10: 25 ug via INTRAVENOUS
  Administered 2017-11-10 (×2): 50 ug via INTRAVENOUS
  Administered 2017-11-10: 25 ug via INTRAVENOUS
  Administered 2017-11-10: 50 ug via INTRAVENOUS

## 2017-11-10 MED ORDER — THROMBIN 20000 UNITS EX SOLR
OROMUCOSAL | Status: DC | PRN
  Administered 2017-11-10: 16:00:00 via TOPICAL

## 2017-11-10 MED ORDER — DEXAMETHASONE SODIUM PHOSPHATE 10 MG/ML IJ SOLN
INTRAMUSCULAR | Status: AC
  Filled 2017-11-10: qty 1

## 2017-11-10 MED ORDER — DEXAMETHASONE SODIUM PHOSPHATE 4 MG/ML IJ SOLN
4.0000 mg | Freq: Four times a day (QID) | INTRAMUSCULAR | Status: DC
  Administered 2017-11-10 – 2017-11-11 (×2): 4 mg via INTRAVENOUS
  Filled 2017-11-10 (×3): qty 1

## 2017-11-10 MED ORDER — SODIUM CHLORIDE 0.9% FLUSH: 3.0000 mL | INTRAVENOUS | Status: DC | PRN

## 2017-11-10 SURGICAL SUPPLY — 67 items
ADH SKN CLS APL DERMABOND .7 (GAUZE/BANDAGES/DRESSINGS) ×1
APL SKNCLS STERI-STRIP NONHPOA (GAUZE/BANDAGES/DRESSINGS) ×1
BAG DECANTER FOR FLEXI CONT (MISCELLANEOUS) ×3 IMPLANT
BASKET BONE COLLECTION (BASKET) ×2 IMPLANT
BENZOIN TINCTURE PRP APPL 2/3 (GAUZE/BANDAGES/DRESSINGS) ×3 IMPLANT
BLADE CLIPPER SURG (BLADE) IMPLANT
BUR MATCHSTICK NEURO 3.0 LAGG (BURR) ×5 IMPLANT
CANISTER SUCT 3000ML PPV (MISCELLANEOUS) ×3 IMPLANT
CARTRIDGE OIL MAESTRO DRILL (MISCELLANEOUS) ×1 IMPLANT
CLOSURE WOUND 1/2 X4 (GAUZE/BANDAGES/DRESSINGS) ×1
CONT SPEC 4OZ CLIKSEAL STRL BL (MISCELLANEOUS) ×2 IMPLANT
DERMABOND ADVANCED (GAUZE/BANDAGES/DRESSINGS) ×2
DERMABOND ADVANCED .7 DNX12 (GAUZE/BANDAGES/DRESSINGS) IMPLANT
DIFFUSER DRILL AIR PNEUMATIC (MISCELLANEOUS) ×3 IMPLANT
DRAPE C-ARM 42X72 X-RAY (DRAPES) ×6 IMPLANT
DRAPE LAPAROTOMY 100X72 PEDS (DRAPES) ×3 IMPLANT
DRAPE POUCH INSTRU U-SHP 10X18 (DRAPES) ×1 IMPLANT
DRSG OPSITE POSTOP 4X6 (GAUZE/BANDAGES/DRESSINGS) ×2 IMPLANT
DURAPREP 6ML APPLICATOR 50/CS (WOUND CARE) ×1 IMPLANT
ELECT REM PT RETURN 9FT ADLT (ELECTROSURGICAL) ×3
ELECTRODE REM PT RTRN 9FT ADLT (ELECTROSURGICAL) ×1 IMPLANT
EVACUATOR 1/8 PVC DRAIN (DRAIN) ×2 IMPLANT
GAUZE SPONGE 4X4 12PLY STRL (GAUZE/BANDAGES/DRESSINGS) ×3 IMPLANT
GAUZE SPONGE 4X4 16PLY XRAY LF (GAUZE/BANDAGES/DRESSINGS) IMPLANT
GLOVE BIO SURGEON STRL SZ7 (GLOVE) IMPLANT
GLOVE BIO SURGEON STRL SZ8 (GLOVE) ×3 IMPLANT
GLOVE BIOGEL PI IND STRL 7.0 (GLOVE) IMPLANT
GLOVE BIOGEL PI INDICATOR 7.0 (GLOVE)
GLOVE EXAM NITRILE LRG STRL (GLOVE) IMPLANT
GLOVE EXAM NITRILE XL STR (GLOVE) IMPLANT
GLOVE EXAM NITRILE XS STR PU (GLOVE) IMPLANT
GOWN STRL REUS W/ TWL LRG LVL3 (GOWN DISPOSABLE) IMPLANT
GOWN STRL REUS W/ TWL XL LVL3 (GOWN DISPOSABLE) ×1 IMPLANT
GOWN STRL REUS W/TWL 2XL LVL3 (GOWN DISPOSABLE) IMPLANT
GOWN STRL REUS W/TWL LRG LVL3 (GOWN DISPOSABLE)
GOWN STRL REUS W/TWL XL LVL3 (GOWN DISPOSABLE) ×3
HEMOSTAT POWDER KIT SURGIFOAM (HEMOSTASIS) ×2 IMPLANT
KIT BASIN OR (CUSTOM PROCEDURE TRAY) ×3 IMPLANT
KIT TURNOVER KIT B (KITS) ×3 IMPLANT
MARKER SKIN DUAL TIP RULER LAB (MISCELLANEOUS) ×5 IMPLANT
NDL HYPO 18GX1.5 BLUNT FILL (NEEDLE) IMPLANT
NDL HYPO 25X1 1.5 SAFETY (NEEDLE) ×1 IMPLANT
NDL SPNL 20GX3.5 QUINCKE YW (NEEDLE) ×1 IMPLANT
NEEDLE HYPO 18GX1.5 BLUNT FILL (NEEDLE) IMPLANT
NEEDLE HYPO 25X1 1.5 SAFETY (NEEDLE) ×3 IMPLANT
NEEDLE SPNL 20GX3.5 QUINCKE YW (NEEDLE) ×3 IMPLANT
NS IRRIG 1000ML POUR BTL (IV SOLUTION) ×3 IMPLANT
OIL CARTRIDGE MAESTRO DRILL (MISCELLANEOUS) ×3
PACK LAMINECTOMY NEURO (CUSTOM PROCEDURE TRAY) ×3 IMPLANT
PIN MAYFIELD SKULL DISP (PIN) ×3 IMPLANT
PUTTY DBM 5CC ×2 IMPLANT
ROD 240MM (Rod) ×2 IMPLANT
SCREW PED PA SOLANAS 3.5X14MM (Screw) ×16 IMPLANT
SCREW POLYAXIAL 12MM (Screw) ×6 IMPLANT
SET SCREW (Screw) ×30 IMPLANT
SET SCREW 2X SPNE THOR TI (Screw) IMPLANT
SPONGE SURGIFOAM ABS GEL 100 (HEMOSTASIS) ×3 IMPLANT
STRIP CLOSURE SKIN 1/2X4 (GAUZE/BANDAGES/DRESSINGS) ×2 IMPLANT
SUT VIC AB 0 CT1 18XCR BRD8 (SUTURE) ×1 IMPLANT
SUT VIC AB 0 CT1 8-18 (SUTURE) ×3
SUT VIC AB 2-0 CP2 18 (SUTURE) ×5 IMPLANT
SUT VIC AB 3-0 SH 8-18 (SUTURE) ×6 IMPLANT
TOWEL GREEN STERILE (TOWEL DISPOSABLE) ×3 IMPLANT
TOWEL GREEN STERILE FF (TOWEL DISPOSABLE) ×3 IMPLANT
TRAY FOLEY MTR SLVR 16FR STAT (SET/KITS/TRAYS/PACK) ×2 IMPLANT
UNDERPAD 30X30 (UNDERPADS AND DIAPERS) ×3 IMPLANT
WATER STERILE IRR 1000ML POUR (IV SOLUTION) ×3 IMPLANT

## 2017-11-10 NOTE — Op Note (Signed)
11/10/2017  4:29 PM  PATIENT:  James Lee  67 y.o. male  PRE-OPERATIVE DIAGNOSIS:  Cervical spondylitic myelopathy with cervical spinal stenosis C3-C7  POST-OPERATIVE DIAGNOSIS:  same  PROCEDURE:  1. Decompressive cervical laminectomy and medial facetectomy C3-4 C4-5 C5-C6 C6-7, 2. Posterior cervical arthrodesis C3 C7 inclusive utilizing locally harvested morselized autologous bone graft and morcellized allograft, 3. Segmental fixation C3-C7 utilizing Alphatec lateral mass screws  SURGEON:  Sherley Bounds, MD  ASSISTANTS: Dr. Sherwood Gambler  ANESTHESIA:   General  EBL: 200 ml  Total I/O In: 1000 [I.V.:1000] Out: 200 [Blood:200]  BLOOD ADMINISTERED: none  DRAINS: Medium Hemovac  SPECIMEN:  none  INDICATION FOR PROCEDURE: This patient presented with change in gait with numbness and tingling in his hands and some neck pain. Imaging showed severe cervical spinal stenosis. The patient tried conservative measures without relief. Pain was debilitating. Recommended decompressive cervical laminectomy followed by instrument effusion. Patient understood the risks, benefits, and alternatives and potential outcomes and wished to proceed.  PROCEDURE DETAILS: The patient was brought to the operating room. Generalized endotracheal anesthesia was induced. The patient was affixed a 3 point [place] headrest and rolled into the prone position on chest rolls. All pressure points were padded. The posterior cervical region was cleaned and prepped with DuraPrep and then draped in the usual sterile fashion. 7 cc of local anesthesia was injected and a dorsal midline incision made in the posterior cervical region and carried down to the cervical fascia. The fascia was opened and the paraspinous musculature was taken down to expose C3-C7. Intraoperative fluoroscopy confirmed my level and then the dissection was carried out over the lateral facets. I localized the midpoint of each lateral mass and marked a region 1 mm  medial to the midpoint of the lateral mass, and then drilled in an upward and outward direction into the safe zone of each lateral mass. I drilled to a depth of 12 mm and then checked my drill hole with a ball probe. I then placed a 12 mm lateral mass screws into the safe zone of each lateral mass until they were 2 fingers tight. I then gently decompressed the central canal by thinning out the lamina to an eggshell with the high-speed drill and then with the 1 and 2 mm Kerrison punch from C3 to C7. Medial facetectomies were performed, and foraminotomies were performed at C4-5. Once the decompression was complete the dura was full and capacious and I could see the spinal cord pulsatile through the dura. I then decorticated the lateral masses and the facet joints and packed them with local autograft and morcellized allograft to perform arthrodesis from C3-C7. I then placed rods into the multiaxial screw heads of the screws and locked these into position with the locking caps and anti-torque device. I then checked the final construct with AP/Lat fluoroscopy. I irrigated with saline solution containing bacitracin. I placed a medium Hemovac drain through separate stab incision, and lined the dura with Gelfoam. After hemostasis was achieved I closed the muscle and the fascia with 0 Vicryl, subcutaneous tissue with 2-0 Vicryl, and the subcuticular tissue with 3-0 Vicryl. The skin was closed with benzoin and Steri-Strips. A sterile dressing was applied, the patient was turned to the supine position and taken out of the headrest, awakened from general anesthesia and transferred to the recovery room in stable condition. At the end of the procedure all sponge, needle and instrument counts were correct.    PLAN OF CARE: Admit to inpatient   PATIENT  DISPOSITION:  PACU - hemodynamically stable.   Delay start of Pharmacological VTE agent (>24hrs) due to surgical blood loss or risk of bleeding:   yes

## 2017-11-10 NOTE — Progress Notes (Signed)
Called to room because blood was trickling down  from a site just below the dressing where a hemovac drain was removed at the pacu. Pressure held and new dressing applied.

## 2017-11-10 NOTE — Transfer of Care (Signed)
Immediate Anesthesia Transfer of Care Note  Patient: James Lee  Procedure(s) Performed: Posterior cervical fusion with lateral mass fixation Cervical Three-Seven, cervical laminectomy Cervical Three- Seven (N/A )  Patient Location: PACU  Anesthesia Type:General  Level of Consciousness: awake and patient cooperative  Airway & Oxygen Therapy: Patient Spontanous Breathing and Patient connected to face mask oxygen  Post-op Assessment: Report given to RN and Post -op Vital signs reviewed and stable  Post vital signs: Reviewed and stable  Last Vitals:  Vitals Value Taken Time  BP 163/85 11/10/2017  4:33 PM  Temp    Pulse 91 11/10/2017  4:35 PM  Resp 17 11/10/2017  4:35 PM  SpO2 96 % 11/10/2017  4:35 PM  Vitals shown include unvalidated device data.  Last Pain:  Vitals:   11/10/17 1252  TempSrc: Oral  PainSc:          Complications: No apparent anesthesia complications

## 2017-11-10 NOTE — H&P (Signed)
Subjective:   Patient is a 67 y.o. male admitted for myelopathy. The patient first presented to me with complaints of neck pain, numbness of the arm(s) and loss of strength of the arm(s). Onset of symptoms was several months ago. The pain is described as aching and occurs intermittently. The pain is rated mild, and is located in the neck and radiates to the arms. The symptoms have been progressive. Symptoms are exacerbated by extending head backwards, and are relieved by none.  Previous work up includes MRI of cervical spine, results: spinal stenosis.  Past Medical History:  Diagnosis Date  . Colon polyps   . Diabetes mellitus without complication ([place])   . Dyslipidemia   . Hyperlipidemia   . Hypertension   . LBP (low back pain)   . Prostate cancer ([place])   . Sleep apnea     Past Surgical History:  Procedure Laterality Date  . BACK SURGERY  1996  . MENISCUS REPAIR Right   . PROSTATECTOMY  01/2012  . TONSILLECTOMY    . TOOTH EXTRACTION      No Known Allergies  Social History   Tobacco Use  . Smoking status: Current Every Day Smoker    Packs/day: 1.00    Years: 40.00    Pack years: 40.00    Types: Cigarettes  . Smokeless tobacco: Never Used  Substance Use Topics  . Alcohol use: No    Alcohol/week: 0.0 oz    Comment: rarely    Family History  Problem Relation Age of Onset  . Throat cancer Father   . Cancer Father   . Coronary artery disease Mother    Prior to Admission medications   Medication Sig Start Date End Date Taking? Authorizing Provider  amLODipine (NORVASC) 10 MG tablet Take 10 mg by mouth daily.   Yes [provider]  aspirin 81 MG tablet Take 81 mg by mouth daily.   Yes [provider]  gabapentin (NEURONTIN) 300 MG capsule Take 300 mg by mouth at bedtime.  10/08/15  Yes [provider]  metFORMIN (GLUCOPHAGE-XR) 500 MG 24 hr tablet Take 1,000 mg by mouth 2 (two) times daily.  09/18/17  Yes [provider]  Multiple  Vitamins-Minerals (MULTIVITAMIN WITH MINERALS) tablet Take 1 tablet by mouth daily.   Yes [provider]  naproxen (EC NAPROSYN) 500 MG EC tablet Take 500 mg by mouth 2 (two) times daily with a meal.   Yes [provider]  naproxen sodium (ANAPROX) 220 MG tablet Take 440 mg by mouth 2 (two) times daily as needed (pain).   Yes [provider]  nebivolol (BYSTOLIC) 5 MG tablet TAKE 1 TABLET BY MOUTH EVERY DAY 09/02/17  Yes [provider]  simvastatin (ZOCOR) 40 MG tablet Take 40 mg by mouth daily.    Yes [provider]  valsartan-hydrochlorothiazide (DIOVAN-HCT) 320-25 MG tablet Take 1 tablet by mouth daily. 10/19/15  Yes [provider]  traMADol (ULTRAM) 50 MG tablet TAKE 1-2 TABS BY MOUTH EVERY 6HRS AS NEEDED FOR PAIN 09/19/17   [provider]     Review of Systems  Positive ROS: neg  All other systems have been reviewed and were otherwise negative with the exception of those mentioned in the HPI and as above.  Objective: Vital signs in last 24 hours: Temp:  [98.2 F (36.8 C)] 98.2 F (36.8 C) (05/31 1252) Pulse Rate:  [71] 71 (05/31 1252) Resp:  [18] 18 (05/31 1252) BP: (130)/(87) 130/87 (05/31 1252) SpO2:  [  97 %] 97 % (05/31 1252) Weight:  [120.2 kg (265 lb)] 120.2 kg (265 lb) (05/31 1246)  General Appearance: Alert, cooperative, no distress, appears stated age Head: Normocephalic, without obvious abnormality, atraumatic Eyes: PERRL, conjunctiva/corneas clear, EOM's intact      Neck: Supple, symmetrical, trachea midline, Back: Symmetric, no curvature, ROM normal, no CVA tenderness Lungs:  respirations unlabored Heart: Regular rate and rhythm Abdomen: Soft, non-tender Extremities: Extremities normal, atraumatic, no cyanosis or edema Pulses: 2+ and symmetric all extremities Skin: Skin color, texture, turgor normal, no rashes or lesions  NEUROLOGIC:  Mental status: Alert and oriented x4, no aphasia, good attention  span, fund of knowledge and memory  Motor Exam - grossly normal Sensory Exam - grossly normal Reflexes: 3+ Coordination - grossly normal Gait - slightly spastic Balance - grossly normal Cranial Nerves: I: smell Not tested  II: visual acuity  OS: nl    OD: nl  II: visual fields Full to confrontation  II: pupils Equal, round, reactive to light  III,VII: ptosis None  III,IV,VI: extraocular muscles  Full ROM  V: mastication Normal  V: facial light touch sensation  Normal  V,VII: corneal reflex  Present  VII: facial muscle function - upper  Normal  VII: facial muscle function - lower Normal  VIII: hearing Not tested  IX: soft palate elevation  Normal  IX,X: gag reflex Present  XI: trapezius strength  5/5  XI: sternocleidomastoid strength 5/5  XI: neck flexion strength  5/5  XII: tongue strength  Normal    Data Review Lab Results  Component Value Date   WBC 11.1 (H) 11/01/2017   HGB 15.9 11/01/2017   HCT 47.4 11/01/2017   MCV 91.3 11/01/2017   PLT 262 11/01/2017   Lab Results  Component Value Date   NA 143 11/01/2017   K 4.1 11/01/2017   CL 108 11/01/2017   CO2 28 11/01/2017   BUN 8 11/01/2017   CREATININE 0.97 11/01/2017   GLUCOSE 100 (H) 11/01/2017   Lab Results  Component Value Date   INR 0.95 11/01/2017    Assessment:   Cervical neck pain with herniated nucleus pulposus/ spondylosis/ stenosis at C3-C7 with cervical spondylitic myelopathy. Estimated body mass index is 36.96 kg/m as calculated from the following:   Height as of this encounter: 5\' 11"  (1.803 m).   Weight as of this encounter: 120.2 kg (265 lb).  Patient has failed conservative therapy. Planned surgery : Posterior cervical decompressive laminectomy C3-C7 followed by instrumented fusion C3-C7 with lateral mass screws  Plan:   I explained the condition and procedure to the patient and answered any questions.  Patient wishes to proceed with procedure as planned. Understands risks/ benefits/ and  expected or typical outcomes.  Jensine Luz S 11/10/2017 1:01 PM

## 2017-11-10 NOTE — Anesthesia Procedure Notes (Addendum)
Procedure Name: Intubation Date/Time: 11/10/2017 1:48 PM Performed by: Lance Coon, CRNA Pre-anesthesia Checklist: Patient identified, Emergency Drugs available, Suction available, Patient being monitored and Timeout performed Patient Re-evaluated:Patient Re-evaluated prior to induction Oxygen Delivery Method: Circle system utilized Preoxygenation: Pre-oxygenation with 100% oxygen Induction Type: IV induction Ventilation: Mask ventilation without difficulty Laryngoscope Size: Miller and 3 Grade View: Grade II Tube type: Oral Tube size: 7.5 mm Number of attempts: 1 Airway Equipment and Method: Stylet Placement Confirmation: ETT inserted through vocal cords under direct vision,  positive ETCO2 and breath sounds checked- equal and bilateral Secured at: 22 cm Tube secured with: Tape Dental Injury: Teeth and Oropharynx as per pre-operative assessment

## 2017-11-10 NOTE — Plan of Care (Signed)
  Problem: Pain Managment: Goal: General experience of comfort will improve Outcome: Progressing   Problem: Elimination: Goal: Will not experience complications related to urinary retention Outcome: Completed/Met   Problem: Skin Integrity: Goal: Risk for impaired skin integrity will decrease Outcome: Completed/Met

## 2017-11-11 ENCOUNTER — Other Ambulatory Visit: Payer: Self-pay

## 2017-11-11 ENCOUNTER — Encounter (HOSPITAL_COMMUNITY): Payer: Self-pay

## 2017-11-11 DIAGNOSIS — M4802 Spinal stenosis, cervical region: Secondary | ICD-10-CM | POA: Diagnosis present

## 2017-11-11 DIAGNOSIS — G4730 Sleep apnea, unspecified: Secondary | ICD-10-CM | POA: Diagnosis present

## 2017-11-11 DIAGNOSIS — K567 Ileus, unspecified: Secondary | ICD-10-CM | POA: Diagnosis not present

## 2017-11-11 DIAGNOSIS — Z7982 Long term (current) use of aspirin: Secondary | ICD-10-CM | POA: Diagnosis not present

## 2017-11-11 DIAGNOSIS — E119 Type 2 diabetes mellitus without complications: Secondary | ICD-10-CM | POA: Diagnosis present

## 2017-11-11 DIAGNOSIS — M4712 Other spondylosis with myelopathy, cervical region: Secondary | ICD-10-CM | POA: Diagnosis present

## 2017-11-11 DIAGNOSIS — Z8546 Personal history of malignant neoplasm of prostate: Secondary | ICD-10-CM | POA: Diagnosis not present

## 2017-11-11 DIAGNOSIS — Z79899 Other long term (current) drug therapy: Secondary | ICD-10-CM | POA: Diagnosis not present

## 2017-11-11 DIAGNOSIS — I10 Essential (primary) hypertension: Secondary | ICD-10-CM | POA: Diagnosis present

## 2017-11-11 DIAGNOSIS — F17210 Nicotine dependence, cigarettes, uncomplicated: Secondary | ICD-10-CM | POA: Diagnosis present

## 2017-11-11 DIAGNOSIS — Z7984 Long term (current) use of oral hypoglycemic drugs: Secondary | ICD-10-CM | POA: Diagnosis not present

## 2017-11-11 DIAGNOSIS — E785 Hyperlipidemia, unspecified: Secondary | ICD-10-CM | POA: Diagnosis present

## 2017-11-11 LAB — GLUCOSE, CAPILLARY
GLUCOSE-CAPILLARY: 367 mg/dL — AB (ref 65–99)
Glucose-Capillary: 207 mg/dL — ABNORMAL HIGH (ref 65–99)
Glucose-Capillary: 221 mg/dL — ABNORMAL HIGH (ref 65–99)
Glucose-Capillary: 275 mg/dL — ABNORMAL HIGH (ref 65–99)

## 2017-11-11 MED ORDER — KETOROLAC TROMETHAMINE 30 MG/ML IJ SOLN
30.0000 mg | Freq: Four times a day (QID) | INTRAMUSCULAR | Status: DC
  Administered 2017-11-11 – 2017-11-13 (×7): 30 mg via INTRAVENOUS
  Filled 2017-11-11 (×7): qty 1

## 2017-11-11 NOTE — Progress Notes (Signed)
Patient ID: James Lee, male   DOB: Jun 14, 1950, 67 y.o.   MRN: [id number] BP (!) 167/81 (BP Location: Left Arm)   Pulse (!) 102   Temp 99.2 F (37.3 C) (Oral)   Resp 19   Ht 5\' 11"  (1.803 m)   Wt 120.2 kg (265 lb)   SpO2 94%   BMI 36.96 kg/m  Alert and oriented x 4, speech is clear and fluent Moving all extremities well Bandage is blood soaked will change Not going home today.

## 2017-11-11 NOTE — Evaluation (Signed)
Occupational Therapy Evaluation Patient Details Name: James Lee MRN: [id number] DOB: July 30, 1950 Today's Date: 11/11/2017    History of Present Illness Pt is a 67 y/o male Decompressive cervical laminectomy and medial facetectomy C3-4 C4-5 C5-C6 C6-7, 2. Posterior cervical arthrodesis C3 C7; Segmental fixation C3-C7. Pt has a PMH including Colon polyps,Back sx 1996, DMII, Dyslipidemia, Hyperlipidemia, HTN, Prostate cancer, and Sleep apnea.    Clinical Impression   Pt presents from above and is currently mod A overall for ADL. Pt educated in compensatory strategies for ADL and sequencing with cervical precautions and with RUE deficits. Pt min guard for toilet transfer with SPC. Pt will benefit from skilled OT in the acute setting and afterwards at the OPOT level to address RUE deficits. Next session to bring cervical handout and also establish HEP for RUE (focus on fine motor with gentle ROM gross as pain and precautions will allow).     Follow Up Recommendations  Outpatient OT;Supervision/Assistance - 24 hour(initially)    Equipment Recommendations  None recommended by OT(potentially AE - monitor for progress)    Recommendations for Other Services       Precautions / Restrictions Precautions Precautions: Cervical Precaution Booklet Issued: No Precaution Comments: reviewed precautions with Pt and wife      Mobility Bed Mobility               General bed mobility comments: received in chair  Transfers Overall transfer level: Needs assistance Equipment used: None Transfers: Sit to/from Stand Sit to Stand: Min assist         General transfer comment: min assist to power up with HHA support    Balance Overall balance assessment: Mild deficits observed, not formally tested                                         ADL either performed or assessed with clinical judgement   ADL Overall ADL's : Needs assistance/impaired Eating/Feeding: Modified  independent;Sitting   Grooming: Set up;Standing;Wash/dry face;Oral care Grooming Details (indicate cue type and reason): compensatory strategy education, limited ROM with RUE - bil hand activities affected Upper Body Bathing: Moderate assistance;Sitting   Lower Body Bathing: Maximal assistance;Sit to/from stand   Upper Body Dressing : Maximal assistance;Sitting   Lower Body Dressing: Moderate assistance;Sit to/from stand   Toilet Transfer: Min guard;Ambulation(SPC)   Toileting- Water quality scientist and Hygiene: Min guard;Sit to/from stand       Functional mobility during ADLs: Min guard;Cane General ADL Comments: with no cervical flexion/extension educated Pt on modifications/adaptations like using a book stand for reading and other strategoes for ADL      Vision Baseline Vision/History: Wears glasses Wears Glasses: At all times Patient Visual Report: No change from baseline       Perception     Praxis      Pertinent Vitals/Pain Pain Assessment: 0-10 Faces Pain Scale: Hurts whole lot Pain Location: neck, shoulders and hands Pain Descriptors / Indicators: Aching;Constant;Discomfort;Grimacing Pain Intervention(s): Limited activity within patient's tolerance;Monitored during session;Repositioned;Other (comment)(ROM)     Hand Dominance Right   Extremity/Trunk Assessment Upper Extremity Assessment Upper Extremity Assessment: RUE deficits/detail RUE Deficits / Details: FF limited to 60 degrees, abduction limited to 45 degrees, grasp 3/5 RUE: Unable to fully assess due to pain RUE Sensation: decreased light touch RUE Coordination: decreased fine motor;decreased gross motor   Lower Extremity Assessment Lower Extremity Assessment: Defer to PT evaluation  Cervical / Trunk Assessment Cervical / Trunk Assessment: (s/p cervical sx)   Communication Communication Communication: No difficulties   Cognition Arousal/Alertness: Awake/alert Behavior During Therapy: WFL for  tasks assessed/performed Overall Cognitive Status: Within Functional Limits for tasks assessed                                     General Comments  wife present at the end of session    Exercises     Shoulder Instructions      Home Living Family/patient expects to be discharged to:: Private residence Living Arrangements: Spouse/significant other Available Help at Discharge: Family Type of Home: [place]: Kasandra Knudsen - single point          Prior Functioning/Environment Level of Independence: Independent with assistive device(s)        Comments: decreased sensation and function of RUE in last few weeks; 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, 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        OT Problem List: Decreased strength;Decreased range of motion;Decreased activity tolerance;Impaired balance (sitting and/or standing);Decreased knowledge of precautions;Impaired sensation;Impaired UE functional use;Pain      OT Treatment/Interventions: Self-care/ADL training;Therapeutic exercise;Neuromuscular education;DME and/or AE instruction;Therapeutic activities;Patient/family education;Balance training    OT Goals(Current goals can be found in the care plan section) Acute Rehab OT Goals Patient Stated Goal: to not hurt OT Goal Formulation: With patient/family Time For Goal Achievement: 11/25/17 Potential to Achieve Goals: Good ADL Goals Pt Will Perform Grooming: with modified independence;standing Pt Will Perform Upper Body Dressing: with min assist;with caregiver independent in assisting;sitting Pt Will Perform Lower Body Dressing: with min assist;with caregiver independent in assisting;sit to/from stand Pt/caregiver will Perform Home Exercise Program: Right Upper extremity;Independently;With written HEP provided Additional ADL Goal #1: Pt will maintain cervical precautions throughout ADL session at indpendent level  OT Frequency:  Min 3X/week   Barriers to D/C:            Co-evaluation              AM-PAC PT "6 Clicks" Daily Activity     Outcome Measure Help from another person eating meals?: A Little Help from another person taking care of personal grooming?: A Little Help from another person toileting, which includes using toliet, bedpan, or urinal?: A Little Help from another person bathing (including washing, rinsing, drying)?: A Lot Help from another person to put on and taking off regular upper body clothing?: A Lot Help from another person to put on and taking off regular lower body clothing?: A Lot 6 Click Score: 15   End of Session Equipment Utilized During Treatment: Gait belt Nurse Communication: Mobility status;Other (comment)(Pt concerned about IV infiltration)  Activity Tolerance: Patient tolerated treatment well;Patient limited by pain(in RUE/shoulder) Patient left: in bed;with call bell/phone within reach;with family/visitor present  OT Visit Diagnosis: Unsteadiness on feet (R26.81);Other abnormalities of gait and mobility (R26.89);Pain;Muscle weakness (generalized) (M62.81) Pain - Right/Left: Right Pain - part of body: Shoulder;Arm;Hand                Time: 2426-8341 OT Time Calculation (min): 26 min Charges:  OT General Charges $OT Visit: 1 Visit OT Evaluation $OT Eval Moderate Complexity: 1 Mod OT Treatments $Self Care/Home Management : 8-22 mins G-Codes:     Mickel Baas  Shakyia Bosso OTR/L [place] 11/11/2017, 5:05 PM

## 2017-11-11 NOTE — Progress Notes (Signed)
Pt's dressing observed to be saturated with serosanguinous fluid, dressing change done with a new honey comb dressing and ABD pad, pt cleaned up and was reassured, pt's RN made aware. Obasogie-Asidi, Gerard Cantara Efe

## 2017-11-11 NOTE — Evaluation (Signed)
Physical Therapy Evaluation Patient Details Name: James Lee MRN: [id number] DOB: [birth year] Today's Date: 11/11/2017   History of Present Illness  Pt is a 67 y/o male Decompressive cervical laminectomy and medial facetectomy C3-4 C4-5 C5-C6 C6-7, 2. Posterior cervical arthrodesis C3 C7; Segmental fixation C3-C7. Pt has a PMH including Colon polyps,Back sx 1996, DMII, Dyslipidemia, Hyperlipidemia, HTN, Prostate cancer, and Sleep apnea.   Clinical Impression  Orders received for PT evaluation. Patient demonstrates deficits in functional mobility as indicated below. Will benefit from continued skilled PT to address deficits and maximize function. Will see as indicated and progress as tolerated.      Follow Up Recommendations No PT follow up    Equipment Recommendations  None recommended by PT    Recommendations for Other Services       Precautions / Restrictions Precautions Precautions: Cervical      Mobility  Bed Mobility               General bed mobility comments: received in chair  Transfers Overall transfer level: Needs assistance Equipment used: None Transfers: Sit to/from Stand Sit to Stand: Min assist         General transfer comment: min assist to power up with HHA support  Ambulation/Gait Ambulation/Gait assistance: Min guard Ambulation Distance (Feet): 110 Feet Assistive device: Straight cane Gait Pattern/deviations: Step-through pattern;Decreased stride length;Shuffle;Drifts right/left Gait velocity: decreased Gait velocity interpretation: <1.8 ft/sec, indicate of risk for recurrent falls General Gait Details: some instability noted, reliance on [facility]  Stairs            Wheelchair Mobility    Modified Rankin (Stroke Patients Only)       Balance Overall balance assessment: Mild deficits observed, not formally tested                                           Pertinent Vitals/Pain Pain Assessment: Faces Faces Pain  Scale: Hurts whole lot Pain Location: neck, shoulders and hands Pain Descriptors / Indicators: Aching;Constant;Discomfort Pain Intervention(s): Limited activity within patient's tolerance;Monitored during session;Repositioned    Home Living Family/patient expects to be discharged to:: Private residence Living Arrangements: Spouse/significant other Available Help at Discharge: Family Type of Home: House         Home Equipment: Kasandra Knudsen - single point      Prior Function Level of Independence: Independent with assistive device(s)               Hand Dominance   Dominant Hand: Right    Extremity/Trunk Assessment   Upper Extremity Assessment Upper Extremity Assessment: Defer to OT evaluation    Lower Extremity Assessment Lower Extremity Assessment: Generalized weakness(history of Peripheral Neuropathy)    Cervical / Trunk Assessment Cervical / Trunk Assessment: (s/p cervical sx)  Communication      Cognition Arousal/Alertness: Awake/alert Behavior During Therapy: WFL for tasks assessed/performed Overall Cognitive Status: Within Functional Limits for tasks assessed                                        General Comments      Exercises     Assessment/Plan    PT Assessment Patient needs continued PT services  PT Problem List Decreased strength;Decreased activity tolerance;Decreased balance;Decreased mobility;Decreased knowledge of precautions;Pain       PT Treatment  Interventions DME instruction;Gait training;Functional mobility training;Therapeutic activities;Therapeutic exercise;Balance training;Patient/family education;Modalities    PT Goals (Current goals can be found in the Care Plan section)  Acute Rehab PT Goals Patient Stated Goal: to not hurt PT Goal Formulation: With patient Time For Goal Achievement: 11/25/17 Potential to Achieve Goals: Good    Frequency Min 5X/week   Barriers to discharge        Co-evaluation                AM-PAC PT "6 Clicks" Daily Activity  Outcome Measure Difficulty turning over in bed (including adjusting bedclothes, sheets and blankets)?: A Little Difficulty moving from lying on back to sitting on the side of the bed? : Unable Difficulty sitting down on and standing up from a chair with arms (e.g., wheelchair, bedside commode, etc,.)?: Unable Help needed moving to and from a bed to chair (including a wheelchair)?: A Little Help needed walking in hospital room?: A Little Help needed climbing 3-5 steps with a railing? : A Little 6 Click Score: 14    End of Session   Activity Tolerance: Patient limited by pain Patient left: in chair;with call bell/phone within reach;with family/visitor present Nurse Communication: Mobility status PT Visit Diagnosis: Unsteadiness on feet (R26.81);Difficulty in walking, not elsewhere classified (R26.2)    Time: 0981-1914 PT Time Calculation (min) (ACUTE ONLY): 16 min   Charges:   PT Evaluation $PT Eval Moderate Complexity: 1 Mod     PT G Codes:        Alben Deeds, PT DPT  Board Certified Neurologic Specialist [place] 11/11/2017, 12:31 PM

## 2017-11-12 LAB — GLUCOSE, CAPILLARY
GLUCOSE-CAPILLARY: 318 mg/dL — AB (ref 65–99)
Glucose-Capillary: 190 mg/dL — ABNORMAL HIGH (ref 65–99)
Glucose-Capillary: 305 mg/dL — ABNORMAL HIGH (ref 65–99)

## 2017-11-12 NOTE — Progress Notes (Signed)
Occupational Therapy Treatment Patient Details Name: Stanislav Gervase MRN: [id number] DOB: [birth year] Today's Date: 11/12/2017    History of present illness Pt is a 67 y/o male Decompressive cervical laminectomy and medial facetectomy C3-4 C4-5 C5-C6 C6-7, 2. Posterior cervical arthrodesis C3 C7; Segmental fixation C3-C7. Pt has a PMH including Colon polyps,Back sx 1996, DMII, Dyslipidemia, Hyperlipidemia, HTN, Prostate cancer, and Sleep apnea.    OT comments  Pt. Provided with HEP with focus on fine motor exercises and activities to promote strengthening and functional use of B hands.  Also reviewed cervical precautions.  Pts. Spouse present and active in session.  Pt. Very motivated to complete the HEP.  Will continue with ADL next session.    Follow Up Recommendations  Outpatient OT;Supervision/Assistance - 24 hour    Equipment Recommendations  None recommended by OT    Recommendations for Other Services      Precautions / Restrictions Precautions Precautions: Cervical Precaution Comments: reviewed precautions with Pt and wife       Mobility Bed Mobility                  Transfers                      Balance                                           ADL either performed or assessed with clinical judgement   ADL                                               Vision       Perception     Praxis      Cognition                                                Exercises Other Exercises Other Exercises: provided fine motor handout, theraputty handout, cervical precaution hand out and med.-soft level theraputty with inst. for use.  reviewed multiple exercises with pt. also discussed importance of use of B hands for all tasks ie: t.paste/brush, opening packages and containers, using phone/texting, turning pages in books and magazines   Shoulder Instructions       General Comments      Pertinent  Vitals/ Pain       Pain Assessment: 0-10 Pain Score: 5  Pain Location: neck, shoulders and hands Pain Descriptors / Indicators: Aching  Home Living                                          Prior Functioning/Environment              Frequency  Min 3X/week        Progress Toward Goals  OT Goals(current goals can now be found in the care plan section)  Progress towards OT goals: Progressing toward goals     Plan      Co-evaluation  AM-PAC PT "6 Clicks" Daily Activity     Outcome Measure   Help from another person eating meals?: A Little Help from another person taking care of personal grooming?: A Little Help from another person toileting, which includes using toliet, bedpan, or urinal?: A Little Help from another person bathing (including washing, rinsing, drying)?: A Lot Help from another person to put on and taking off regular upper body clothing?: A Lot Help from another person to put on and taking off regular lower body clothing?: A Lot 6 Click Score: 15    End of Session    OT Visit Diagnosis: Unsteadiness on feet (R26.81);Other abnormalities of gait and mobility (R26.89);Pain;Muscle weakness (generalized) (M62.81) Pain - Right/Left: Right Pain - part of body: Shoulder;Arm;Hand   Activity Tolerance Patient tolerated treatment well   Patient Left in chair;with call bell/phone within reach;with family/visitor present   Nurse Communication          Time: 8416-6063 OT Time Calculation (min): 16 min  Charges: OT General Charges $OT Visit: 1 Visit OT Treatments $Self Care/Home Management : 8-22 mins   Janice Coffin, COTA/L 11/12/2017, 11:15 AM

## 2017-11-12 NOTE — Progress Notes (Signed)
Vitals:   11/11/17 2344 11/12/17 0358 11/12/17 0903 11/12/17 1212  BP: (!) 153/74 139/62 (!) 159/84 (!) 146/79  Pulse: 91 79 83 86  Resp: 18 18 18 18   Temp: 98.4 F (36.9 C) 98.1 F (36.7 C)  97.7 F (36.5 C)  TempSrc: Oral Oral  Oral  SpO2: 95% 98% 98% 95%  Weight:      Height:        Patient still with significant incisional pain.  Requiring IV Dilaudid.  Dressing is been changed by nursing staff, last about 3 hours ago, now with only a small amount of bloody drainage of the top of the honeycomb.  Has been up and out of bed and walking some.  Plan: To need to progress through postoperative recovery.  Hopefully will be able to transition over the next 24 to 48 hours to a entirely enteral pain regimen.  Encouraged to ambulate.  Hosie Spangle, MD 11/12/2017, 12:53 PM

## 2017-11-12 NOTE — Progress Notes (Signed)
Physical Therapy Treatment Patient Details Name: James Lee MRN: [id number] DOB: August 13, 1950 Today's Date: 11/12/2017    History of Present Illness Pt is a 67 y/o male Decompressive cervical laminectomy and medial facetectomy C3-4 C4-5 C5-C6 C6-7, 2. Posterior cervical arthrodesis C3 C7; Segmental fixation C3-C7. Pt has a PMH including Colon polyps,Back sx 1996, DMII, Dyslipidemia, Hyperlipidemia, HTN, Prostate cancer, and Sleep apnea.     PT Comments    Patient seen for mobility progression s/p spinal surgery. Mobilizing well. Educated patient on precautions, mobility expectations, safety and car transfers. No further acute PT needs. Will have necessary assist at home and feels comfortable with mobility. Will sign off.    Follow Up Recommendations  No PT follow up     Equipment Recommendations  None recommended by PT    Recommendations for Other Services       Precautions / Restrictions Precautions Precautions: Cervical Precaution Comments: reviewed precautions with Pt and wife    Mobility  Bed Mobility               General bed mobility comments: received in chair  Transfers Overall transfer level: Needs assistance Equipment used: None Transfers: Sit to/from Stand Sit to Stand: Supervision         General transfer comment: no physical assist today  Ambulation/Gait Ambulation/Gait assistance: Modified independent (Device/Increase time) Ambulation Distance (Feet): 180 Feet Assistive device: Straight cane Gait Pattern/deviations: Step-through pattern;Decreased stride length;Shuffle;Drifts right/left Gait velocity: decreased Gait velocity interpretation: 1.31 - 2.62 ft/sec, indicative of limited community ambulator General Gait Details: improved stability today, improved cadence   Stairs Stairs: Yes Stairs assistance: Supervision Stair Management: One rail Right Number of Stairs: 4 General stair comments: performed x2, educated on technique for blind  negotiation   Wheelchair Mobility    Modified Rankin (Stroke Patients Only)       Balance Overall balance assessment: Mild deficits observed, not formally tested                                          Cognition Arousal/Alertness: Awake/alert Behavior During Therapy: WFL for tasks assessed/performed Overall Cognitive Status: Within Functional Limits for tasks assessed                                        Exercises Other Exercises Other Exercises: provided fine motor handout, theraputty handout, cervical precaution hand out and med.-soft level theraputty with inst. for use.  reviewed multiple exercises with pt. also discussed importance of use of B hands for all tasks ie: t.paste/brush, opening packages and containers, using phone/texting, turning pages in books and magazines    General Comments        Pertinent Vitals/Pain Pain Assessment: 0-10 Pain Score: 5  Pain Location: neck, shoulders and hands Pain Descriptors / Indicators: Aching;Constant;Discomfort Pain Intervention(s): Monitored during session    Home Living                      Prior Function            PT Goals (current goals can now be found in the care plan section) Acute Rehab PT Goals Patient Stated Goal: to not hurt PT Goal Formulation: With patient Time For Goal Achievement: 11/25/17 Potential to Achieve Goals: Good Progress towards PT goals: Progressing toward  goals    Frequency    Min 5X/week      PT Plan Current plan remains appropriate    Co-evaluation              AM-PAC PT "6 Clicks" Daily Activity  Outcome Measure  Difficulty turning over in bed (including adjusting bedclothes, sheets and blankets)?: A Little Difficulty moving from lying on back to sitting on the side of the bed? : Unable Difficulty sitting down on and standing up from a chair with arms (e.g., wheelchair, bedside commode, etc,.)?: Unable Help needed moving  to and from a bed to chair (including a wheelchair)?: A Little Help needed walking in hospital room?: A Little Help needed climbing 3-5 steps with a railing? : A Little 6 Click Score: 14    End of Session       Nurse Communication: Mobility status PT Visit Diagnosis: Unsteadiness on feet (R26.81);Difficulty in walking, not elsewhere classified (R26.2)     Time: 3832-9191 PT Time Calculation (min) (ACUTE ONLY): 15 min  Charges:  $Gait Training: 8-22 mins                    G Codes:       Alben Deeds, PT DPT  Board Certified Neurologic Specialist [place] 11/12/2017, 1:10 PM

## 2017-11-12 NOTE — Anesthesia Preprocedure Evaluation (Signed)
Anesthesia Evaluation  Patient identified by MRN, date of birth, ID band Patient awake    Reviewed: Allergy & Precautions, NPO status , Patient's Chart, lab work & pertinent test results  Airway Mallampati: II  TM Distance: >3 FB Neck ROM: Full    Dental   Pulmonary sleep apnea , Current Smoker,    Pulmonary exam normal        Cardiovascular hypertension, Pt. on medications Normal cardiovascular exam     Neuro/Psych    GI/Hepatic   Endo/Other  diabetes, Type 2, Oral Hypoglycemic Agents  Renal/GU      Musculoskeletal   Abdominal   Peds  Hematology   Anesthesia Other Findings   Reproductive/Obstetrics                             Anesthesia Physical Anesthesia Plan  ASA: II  Anesthesia Plan: General   Post-op Pain Management:    Induction:   PONV Risk Score and Plan: 1 and Ondansetron and Midazolam  Airway Management Planned: Oral ETT  Additional Equipment:   Intra-op Plan:   Post-operative Plan: Extubation in OR  Informed Consent: I have reviewed the patients History and Physical, chart, labs and discussed the procedure including the risks, benefits and alternatives for the proposed anesthesia with the patient or authorized representative who has indicated his/her understanding and acceptance.     Plan Discussed with: CRNA and Surgeon  Anesthesia Plan Comments:         Anesthesia Quick Evaluation

## 2017-11-12 NOTE — Plan of Care (Signed)
Patient stable, discussed POC with patient, denies question/concerns at this time.  

## 2017-11-12 NOTE — Anesthesia Postprocedure Evaluation (Signed)
Anesthesia Post Note  Patient: James Lee  Procedure(s) Performed: Posterior cervical fusion with lateral mass fixation Cervical Three-Seven, cervical laminectomy Cervical Three- Seven (N/A )     Patient location during evaluation: PACU Anesthesia Type: General Level of consciousness: awake and alert Pain management: pain level controlled Vital Signs Assessment: post-procedure vital signs reviewed and stable Respiratory status: spontaneous breathing, nonlabored ventilation, respiratory function stable and patient connected to nasal cannula oxygen Cardiovascular status: blood pressure returned to baseline and stable Postop Assessment: no apparent nausea or vomiting Anesthetic complications: no    Last Vitals:  Vitals:   11/12/17 1212 11/12/17 1642  BP: (!) 146/79 (!) 158/84  Pulse: 86 79  Resp: 18 17  Temp: 36.5 C 36.7 C  SpO2: 95% 98%    Last Pain:  Vitals:   11/12/17 1642  TempSrc: Oral  PainSc: 0-No pain                 Vishal Sandlin DAVID

## 2017-11-13 LAB — GLUCOSE, CAPILLARY
GLUCOSE-CAPILLARY: 189 mg/dL — AB (ref 65–99)
GLUCOSE-CAPILLARY: 230 mg/dL — AB (ref 65–99)
GLUCOSE-CAPILLARY: 189 mg/dL — AB (ref 65–99)
GLUCOSE-CAPILLARY: 194 mg/dL — AB (ref 65–99)
Glucose-Capillary: 279 mg/dL — ABNORMAL HIGH (ref 65–99)

## 2017-11-13 MED ORDER — ALUM & MAG HYDROXIDE-SIMETH 200-200-20 MG/5ML PO SUSP
30.0000 mL | Freq: Four times a day (QID) | ORAL | Status: DC | PRN
  Administered 2017-11-13: 30 mL via ORAL
  Filled 2017-11-13: qty 30

## 2017-11-13 MED ORDER — POLYETHYLENE GLYCOL 3350 17 G PO PACK
17.0000 g | PACK | Freq: Every day | ORAL | Status: DC
  Administered 2017-11-13 – 2017-11-16 (×4): 17 g via ORAL
  Filled 2017-11-13 (×4): qty 1

## 2017-11-13 MED ORDER — FLEET ENEMA 7-19 GM/118ML RE ENEM
1.0000 | ENEMA | Freq: Once | RECTAL | Status: AC
  Administered 2017-11-13: 1 via RECTAL
  Filled 2017-11-13: qty 1

## 2017-11-13 MED ORDER — DOCUSATE SODIUM 100 MG PO CAPS
100.0000 mg | ORAL_CAPSULE | Freq: Two times a day (BID) | ORAL | Status: DC
  Administered 2017-11-13 – 2017-11-16 (×7): 100 mg via ORAL
  Filled 2017-11-13 (×7): qty 1

## 2017-11-13 MED ORDER — GLYCERIN (LAXATIVE) 2.1 G RE SUPP
1.0000 | RECTAL | Status: DC | PRN
  Administered 2017-11-13: 1 via RECTAL
  Filled 2017-11-13: qty 1

## 2017-11-13 NOTE — Progress Notes (Signed)
Occupational Therapy Treatment Patient Details Name: James Lee MRN: [id number] DOB: 06/01/1951 Today's Date: 11/13/2017    History of present illness Pt is a 67 y/o male Decompressive cervical laminectomy and medial facetectomy C3-4 C4-5 C5-C6 C6-7, 2. Posterior cervical arthrodesis C3 C7; Segmental fixation C3-C7. Pt has a PMH including Colon polyps,Back sx 1996, DMII, Dyslipidemia, Hyperlipidemia, HTN, Prostate cancer, and Sleep apnea.    OT comments  This 67 yo male admitted and underwent above presents to acute OT with increased movement, increased use, and decreased pain in RUE and able to do his basic ADLs per his report. All RUE exercises covered with pt, no further OT needs, we will sign off.  Follow Up Recommendations  Outpatient OT;Supervision/Assistance - 24 hour    Equipment Recommendations  None recommended by OT       Precautions / Restrictions Precautions Precautions: Cervical Restrictions Weight Bearing Restrictions: No       Mobility Bed Mobility  Pt up in recliner upon arrival                       ADL either performed or assessed with clinical judgement   ADL         Grooming Details (indicate cue type and reason): We discussed the use of 2 cups for brushing teeth (one to spit and one to rinse with straw) so that he is not bending over the sink                                      Vision Baseline Vision/History: No visual deficits Wears Glasses: At all times Patient Visual Report: No change from baseline            Cognition Arousal/Alertness: Awake/alert Behavior During Therapy: WFL for tasks assessed/performed Overall Cognitive Status: Within Functional Limits for tasks assessed                                          Exercises Other Exercises Other Exercises: Pt had theraputty and FM exercise sheet from yesterday as well as red theraputty. Gave pt another handout with a sequence of activties he  can do with theraputty. I marked the items on the FM sheet I wanted him to do and added a couple of GM ones (hanging clothes on hangers, folding towels/washcloths in air, taking out/putting back in items in cabinet/shelf that are non-breakable). Making sure that with non of these he is reaching above shoulder height.           Pertinent Vitals/ Pain       Pain Assessment: Faces Faces Pain Scale: Hurts little more Pain Location: abdomen Pain Descriptors / Indicators: Cramping;Aching Pain Intervention(s): Monitored during session(pt reports RN is already aware and they are working on it)      Progress Toward Goals  OT Goals(current goals can now be found in the care plan section)  Progress towards OT goals: Goals met/education completed, patient discharged from [place] All goals met and education completed, patient discharged from [place] PT "6 Clicks" Daily Activity     Outcome Measure   Help from another person eating meals?: None Help from another person taking care of personal grooming?: None Help from another person toileting,  which includes using toliet, bedpan, or urinal?: None Help from another person bathing (including washing, rinsing, drying)?: None Help from another person to put on and taking off regular upper body clothing?: None Help from another person to put on and taking off regular lower body clothing?: None 6 Click Score: 24    End of Session    OT Visit Diagnosis: Pain;Muscle weakness (generalized) (M62.81) Pain - part of body: (abdomen)   Activity Tolerance Patient tolerated treatment well   Patient Left in chair;with call bell/phone within reach   Nurse Communication          Time: 1245-8099 OT Time Calculation (min): 27 min  Charges: OT General Charges $OT Visit: 1 Visit OT Treatments $Self Care/Home Management : 8-22 mins $Therapeutic Exercise: 8-22 mins  [place], OTR/L [phone] 11/13/2017

## 2017-11-13 NOTE — Plan of Care (Signed)
Patient stable, changed dressing PRN. Discussed POC with patient including BM program, agreeable w/plan, denies question/concerns at this time.

## 2017-11-13 NOTE — Progress Notes (Signed)
Inpatient Diabetes Program Recommendations  AACE/ADA: New Consensus Statement on Inpatient Glycemic Control (2019)  Target Ranges:  Prepandial:   less than 140 mg/dL      Peak postprandial:   less than 180 mg/dL (1-2 hours)      Critically ill patients:  140 - 180 mg/dL  Results for James Lee, James Lee (MRN [id number]) as of 11/13/2017 10:28  Ref. Range 11/12/2017 06:02 11/12/2017 11:37 11/12/2017 17:12 11/12/2017 21:29 11/13/2017 06:02  Glucose-Capillary Latest Ref Range: 65 - 99 mg/dL 190 (H) 305 (H) 318 (H) 279 (H) 189 (H)   Results for James Lee, James Lee (MRN [id number]) as of 11/13/2017 10:28  Ref. Range 11/01/2017 13:32  Hemoglobin A1C Latest Ref Range: 4.8 - 5.6 % 6.2 (H)   Review of Glycemic Control  Diabetes history: DM2 Outpatient Diabetes medications: Metformin XR 1000 mg BID Current orders for Inpatient glycemic control: Novolog 0-15 units TID with meals, Metformin XR 1000 mg BID  Inpatient Diabetes Program Recommendations: Correction (SSI): Please consider ordering Novolog 0-5 units QHS for bedtime correction. HgbA1C: A1C 6.2% on 11/01/17 indicating good glycemic control over the past 2-3 months.  NOTE: Noted patient has been receiving Decadron and last dose given was 4 mg at 6:05 am today. Decadron contributing to hyperglycemia. No other orders for Decadron at this time so anticipate glucose to improve.   Thanks, Barnie Alderman, RN, MSN, CDE Diabetes Coordinator Inpatient Diabetes Program [phone] (Team Pager from 8am to 5pm)

## 2017-11-13 NOTE — Progress Notes (Signed)
Subjective: Patient reports continued neck soreness, no arm pain or NTW  Objective: Vital signs in last 24 hours: Temp:  [97.7 F (36.5 C)-99 F (37.2 C)] 99 F (37.2 C) (06/03 0725) Pulse Rate:  [54-86] 56 (06/03 0725) Resp:  [16-18] 16 (06/03 0725) BP: (127-159)/(64-84) 127/65 (06/03 0725) SpO2:  [94 %-98 %] 96 % (06/03 0725)  Intake/Output from previous day: No intake/output data recorded. Intake/Output this shift: No intake/output data recorded.  Neurologic: Grossly normal, wound looks ok but does drain serosanguinuos fluid  Lab Results: Lab Results  Component Value Date   WBC 11.1 (H) 11/01/2017   HGB 15.9 11/01/2017   HCT 47.4 11/01/2017   MCV 91.3 11/01/2017   PLT 262 11/01/2017   Lab Results  Component Value Date   INR 0.95 11/01/2017   BMET Lab Results  Component Value Date   NA 143 11/01/2017   K 4.1 11/01/2017   CL 108 11/01/2017   CO2 28 11/01/2017   GLUCOSE 100 (H) 11/01/2017   BUN 8 11/01/2017   CREATININE 0.97 11/01/2017   CALCIUM 9.7 11/01/2017    Studies/Results: No results found.  Assessment/Plan: Still requiring IV pain meds, wound drainage continues, hopefully home tomorrow?  Estimated body mass index is 36.96 kg/m as calculated from the following:   Height as of this encounter: 5\' 11"  (1.803 m).   Weight as of this encounter: 120.2 kg (265 lb).    LOS: 3 days    JONES,DAVID S 11/13/2017, 7:46 AM

## 2017-11-14 ENCOUNTER — Encounter (HOSPITAL_COMMUNITY): Payer: Self-pay | Admitting: Neurological Surgery

## 2017-11-14 ENCOUNTER — Inpatient Hospital Stay (HOSPITAL_COMMUNITY): Payer: BLUE CROSS/BLUE SHIELD

## 2017-11-14 LAB — GLUCOSE, CAPILLARY
GLUCOSE-CAPILLARY: 95 mg/dL (ref 65–99)
Glucose-Capillary: 136 mg/dL — ABNORMAL HIGH (ref 65–99)
Glucose-Capillary: 176 mg/dL — ABNORMAL HIGH (ref 65–99)
Glucose-Capillary: 124 mg/dL — ABNORMAL HIGH (ref 65–99)

## 2017-11-14 MED ORDER — NALOXEGOL OXALATE 25 MG PO TABS
25.0000 mg | ORAL_TABLET | Freq: Every day | ORAL | Status: DC
  Administered 2017-11-14 – 2017-11-16 (×3): 25 mg via ORAL
  Filled 2017-11-14 (×3): qty 1

## 2017-11-14 MED ORDER — METOCLOPRAMIDE HCL 5 MG/ML IJ SOLN
10.0000 mg | Freq: Four times a day (QID) | INTRAMUSCULAR | Status: AC
  Administered 2017-11-14 – 2017-11-15 (×4): 10 mg via INTRAVENOUS
  Filled 2017-11-14 (×4): qty 2

## 2017-11-14 NOTE — Progress Notes (Signed)
Subjective: Patient reports very little flatus and epigastric discomfort. No bowel movement since surgery. Appropriate neck soreness without arm pain or numbness tingling or weakness  Objective: Vital signs in last 24 hours: Temp:  [98.1 F (36.7 C)-98.5 F (36.9 C)] 98.1 F (36.7 C) (06/04 0456) Pulse Rate:  [57-70] 70 (06/04 0456) Resp:  [16-18] 18 (06/04 0456) BP: (112-135)/(53-74) 135/73 (06/04 0456) SpO2:  [94 %-100 %] 97 % (06/04 0456)  Intake/Output from previous day: 06/03 0701 - 06/04 0700 In: 240 [P.O.:240] Out: -  Intake/Output this shift: No intake/output data recorded.  Neurologic: Grossly normal, abdomen slightly distended and hypertympanic,   Lab Results: Lab Results  Component Value Date   WBC 11.1 (H) 11/01/2017   HGB 15.9 11/01/2017   HCT 47.4 11/01/2017   MCV 91.3 11/01/2017   PLT 262 11/01/2017   Lab Results  Component Value Date   INR 0.95 11/01/2017   BMET Lab Results  Component Value Date   NA 143 11/01/2017   K 4.1 11/01/2017   CL 108 11/01/2017   CO2 28 11/01/2017   GLUCOSE 100 (H) 11/01/2017   BUN 8 11/01/2017   CREATININE 0.97 11/01/2017   CALCIUM 9.7 11/01/2017    Studies/Results: No results found.  Assessment/Plan: Suspect he has a partial ileus. We'll start Reglan and movantik. Encouraged ambulation. We'll check KUB. Change diet to clear liquids.  Estimated body mass index is 36.96 kg/m as calculated from the following:   Height as of this encounter: 5\' 11"  (1.803 m).   Weight as of this encounter: 120.2 kg (265 lb).    LOS: 4 days    Tywana Robotham S 11/14/2017, 8:19 AM

## 2017-11-14 NOTE — Progress Notes (Addendum)
Honey comb dressing changed x2 PRN, saturated with serosanguineous drainage.  Ambulated in hallway x2.

## 2017-11-15 LAB — GLUCOSE, CAPILLARY
GLUCOSE-CAPILLARY: 134 mg/dL — AB (ref 65–99)
GLUCOSE-CAPILLARY: 103 mg/dL — AB (ref 65–99)
GLUCOSE-CAPILLARY: 201 mg/dL — AB (ref 65–99)
Glucose-Capillary: 113 mg/dL — ABNORMAL HIGH (ref 65–99)

## 2017-11-15 MED ORDER — FLEET ENEMA 7-19 GM/118ML RE ENEM: 1.0000 | ENEMA | Freq: Every day | RECTAL | Status: DC | PRN

## 2017-11-15 MED ORDER — BISACODYL 10 MG RE SUPP
10.0000 mg | Freq: Once | RECTAL | Status: AC
  Administered 2017-11-15: 10 mg via RECTAL
  Filled 2017-11-15: qty 1

## 2017-11-15 NOTE — Progress Notes (Signed)
Subjective: Patient reports very little flatus no BM, no NV, tolerating clears, neck sore  Objective: Vital signs in last 24 hours: Temp:  [98.3 F (36.8 C)-99.4 F (37.4 C)] 99.4 F (37.4 C) (06/05 0729) Pulse Rate:  [59-67] 67 (06/05 0729) Resp:  [16-20] 20 (06/05 0729) BP: (114-148)/(61-125) 131/68 (06/05 0729) SpO2:  [94 %-100 %] 94 % (06/05 0729)  Intake/Output from previous day: 06/04 0701 - 06/05 0700 In: 240 [P.O.:240] Out: -  Intake/Output this shift: No intake/output data recorded.  Neurologic: Grossly normal, abd distended  Lab Results: Lab Results  Component Value Date   WBC 11.1 (H) 11/01/2017   HGB 15.9 11/01/2017   HCT 47.4 11/01/2017   MCV 91.3 11/01/2017   PLT 262 11/01/2017   Lab Results  Component Value Date   INR 0.95 11/01/2017   BMET Lab Results  Component Value Date   NA 143 11/01/2017   K 4.1 11/01/2017   CL 108 11/01/2017   CO2 28 11/01/2017   GLUCOSE 100 (H) 11/01/2017   BUN 8 11/01/2017   CREATININE 0.97 11/01/2017   CALCIUM 9.7 11/01/2017    Studies/Results: Dg Abd 1 View  Result Date: 11/14/2017 CLINICAL DATA:  Constipation after cervical spine surgery. EXAM: ABDOMEN - 1 VIEW COMPARISON:  None. FINDINGS: Moderate stool burden. No obstruction or free air. Penile prosthesis pump. Lumbar spondylosis. IMPRESSION: Moderate stool burden.  No obstruction or free air. Electronically Signed   By: Staci Righter M.D.   On: 11/14/2017 09:39    Assessment/Plan: Still continued issues with bowels, pain control. Ccm today  Estimated body mass index is 36.96 kg/m as calculated from the following:   Height as of this encounter: 5\' 11"  (1.803 m).   Weight as of this encounter: 120.2 kg (265 lb).    LOS: 5 days    Mukhtar Shams S 11/15/2017, 8:08 AM

## 2017-11-16 LAB — GLUCOSE, CAPILLARY: Glucose-Capillary: 151 mg/dL — ABNORMAL HIGH (ref 65–99)

## 2017-11-16 MED ORDER — OXYCODONE HCL 10 MG PO TABS: 10.0000 mg | ORAL_TABLET | Freq: Four times a day (QID) | ORAL | 0 refills | Status: DC | PRN

## 2017-11-16 MED ORDER — METHOCARBAMOL 500 MG PO TABS: 500.0000 mg | ORAL_TABLET | Freq: Four times a day (QID) | ORAL | 1 refills | Status: DC | PRN

## 2017-11-16 MED ORDER — NALOXEGOL OXALATE 25 MG PO TABS
25.0000 mg | ORAL_TABLET | Freq: Every day | ORAL | 1 refills | Status: DC
Start: 2017-11-17 — End: 2018-01-02

## 2017-11-16 NOTE — Care Management Note (Signed)
Case Management Note  Patient Details  Name: James Lee MRN: [id number] Date of Birth: 17-Mar-1951  Subjective/Objective:                    Action/Plan: Pt discharging home with self care. Pt has hospital f/u, insurance and transportation home.  OT recommending Outpt therapy. Pt to f/u with Neurosurgeon about timing of outpatient therapy.  Expected Discharge Date:  11/16/17               Expected Discharge Plan:  Home/Self Care  In-House Referral:     Discharge planning Services     Post Acute Care Choice:    Choice offered to:     DME Arranged:    DME Agency:     HH Arranged:    HH Agency:     Status of Service:  Completed, signed off  If discussed at [employer] of Stay Meetings, dates discussed:    Additional Comments:  Pollie Friar, RN 11/16/2017, 11:52 AM

## 2017-11-16 NOTE — Discharge Summary (Signed)
Physician Discharge Summary  Patient ID: James Lee MRN: [id number] DOB/AGE: 08-08-1950 67 y.o.  Admit date: 11/10/2017 Discharge date: 11/16/2017  Admission Diagnoses: cervical stenosis with myelopathy    Discharge Diagnoses: same   Discharged Condition: good  Hospital Course: The patient was admitted on 11/10/2017 and taken to the operating room where the patient underwent CL/ PCF C3-7. The patient tolerated the procedure well and was taken to the recovery room and then to the floor in stable condition. The hospital course was routine. There were no complications. The wound remained clean dry and intact. Pt had appropriate neck soreness. No complaints of arm pain or new N/T/W. The patient remained afebrile with stable vital signs, and tolerated a regular diet. The patient continued to increase activities, and pain was well controlled with oral pain medications. His discharge was delayed secondary to ileus that resolved  Consults: None  Significant Diagnostic Studies:  Results for orders placed or performed during the hospital encounter of 11/10/17  Glucose, capillary  Result Value Ref Range   Glucose-Capillary 108 (H) 65 - 99 mg/dL  Glucose, capillary  Result Value Ref Range   Glucose-Capillary 150 (H) 65 - 99 mg/dL   Comment 1 Notify RN    Comment 2 Document in Chart   Glucose, capillary  Result Value Ref Range   Glucose-Capillary 294 (H) 65 - 99 mg/dL   Comment 1 Notify RN    Comment 2 Document in Chart   Glucose, capillary  Result Value Ref Range   Glucose-Capillary 207 (H) 65 - 99 mg/dL  Glucose, capillary  Result Value Ref Range   Glucose-Capillary 367 (H) 65 - 99 mg/dL  Glucose, capillary  Result Value Ref Range   Glucose-Capillary 221 (H) 65 - 99 mg/dL   Comment 1 Notify RN    Comment 2 Document in Chart   Glucose, capillary  Result Value Ref Range   Glucose-Capillary 275 (H) 65 - 99 mg/dL   Comment 1 Notify RN    Comment 2 Document in Chart   Glucose,  capillary  Result Value Ref Range   Glucose-Capillary 190 (H) 65 - 99 mg/dL   Comment 1 Notify RN    Comment 2 Document in Chart   Glucose, capillary  Result Value Ref Range   Glucose-Capillary 305 (H) 65 - 99 mg/dL   Comment 1 Notify RN    Comment 2 Document in Chart   Glucose, capillary  Result Value Ref Range   Glucose-Capillary 318 (H) 65 - 99 mg/dL   Comment 1 Notify RN    Comment 2 Document in Chart   Glucose, capillary  Result Value Ref Range   Glucose-Capillary 189 (H) 65 - 99 mg/dL   Comment 1 Notify RN    Comment 2 Document in Chart   Glucose, capillary  Result Value Ref Range   Glucose-Capillary 279 (H) 65 - 99 mg/dL  Glucose, capillary  Result Value Ref Range   Glucose-Capillary 230 (H) 65 - 99 mg/dL   Comment 1 Notify RN    Comment 2 Document in Chart   Glucose, capillary  Result Value Ref Range   Glucose-Capillary 189 (H) 65 - 99 mg/dL  Glucose, capillary  Result Value Ref Range   Glucose-Capillary 194 (H) 65 - 99 mg/dL  Glucose, capillary  Result Value Ref Range   Glucose-Capillary 136 (H) 65 - 99 mg/dL  Glucose, capillary  Result Value Ref Range   Glucose-Capillary 176 (H) 65 - 99 mg/dL  Glucose, capillary  Result Value Ref  Range   Glucose-Capillary 95 65 - 99 mg/dL  Glucose, capillary  Result Value Ref Range   Glucose-Capillary 124 (H) 65 - 99 mg/dL  Glucose, capillary  Result Value Ref Range   Glucose-Capillary 113 (H) 65 - 99 mg/dL   Comment 1 Notify RN    Comment 2 Document in Chart   Glucose, capillary  Result Value Ref Range   Glucose-Capillary 134 (H) 65 - 99 mg/dL  Glucose, capillary  Result Value Ref Range   Glucose-Capillary 103 (H) 65 - 99 mg/dL  Glucose, capillary  Result Value Ref Range   Glucose-Capillary 201 (H) 65 - 99 mg/dL  Glucose, capillary  Result Value Ref Range   Glucose-Capillary 151 (H) 65 - 99 mg/dL   Comment 1 Notify RN    Comment 2 Document in Chart     Chest 2 View  Result Date: 11/02/2017 CLINICAL DATA:   Cervical spine surgery.  Preoperative exam. EXAM: CHEST - 2 VIEW COMPARISON:  No recent prior. FINDINGS: Mediastinum and hilar structures normal. Lungs are clear. Mild bilateral peribronchial cuffing. No focal infiltrate. No pleural effusion or pneumothorax. Heart size normal. No acute bony abnormality IMPRESSION: Mild bilateral peribronchial cuffing. Mild bronchitis can't be excluded. No acute cardiopulmonary disease otherwise noted. Electronically Signed   By: Marcello Moores  Register   On: 11/02/2017 07:51   Dg Cervical Spine Complete  Result Date: 11/10/2017 CLINICAL DATA:  Posterior cervical fusion with lateral mass fixation C3-7. Cervical laminectomy. EXAM: CERVICAL SPINE - COMPLETE 4+ VIEW; DG C-ARM 61-120 MIN COMPARISON:  CT myelogram 10/23/2017 FINDINGS: Intraoperative images of the cervical spine demonstrate bilateral lateral mass screws at C3, C4, C5, C6, and C7. Alignment is anatomic. Hardware is intact. IMPRESSION: Posterior fusion of the cervical spine as described. No radiographic evidence for complication. Electronically Signed   By: San Morelle M.D.   On: 11/10/2017 16:38   Dg Abd 1 View  Result Date: 11/14/2017 CLINICAL DATA:  Constipation after cervical spine surgery. EXAM: ABDOMEN - 1 VIEW COMPARISON:  None. FINDINGS: Moderate stool burden. No obstruction or free air. Penile prosthesis pump. Lumbar spondylosis. IMPRESSION: Moderate stool burden.  No obstruction or free air. Electronically Signed   By: Staci Righter M.D.   On: 11/14/2017 09:39   Ct Cervical Spine W Contrast  Result Date: 10/23/2017 CLINICAL DATA:  Right upper extremity hyper reflexia. Dizziness when looking upwards. Low back pain extending into the left lower extremity. Previous surgeries for similar symptoms. Symptoms have recurred. Left S1 radiculopathy. FLUOROSCOPY TIME:  Radiation Exposure Index (as provided by the fluoroscopic device): 625.97 uGy*m2 If the device does not provide the exposure index: Fluoroscopy  Time:  1 minutes 26 seconds Number of Acquired Images:  27 PROCEDURE: LUMBAR PUNCTURE FOR CERVICAL AND LUMBAR MYELOGRAM CERVICAL AND LUMBAR MYELOGRAM CT CERVICAL MYELOGRAM CT LUMBAR MYELOGRAM After thorough discussion of risks and benefits of the procedure including bleeding, infection, injury to nerves, blood vessels, adjacent structures as well as headache and CSF leak, written and oral informed consent was obtained. Consent was obtained by Dr. San Morelle. Patient was positioned prone on the fluoroscopy table. Local anesthesia was provided with 1% lidocaine without epinephrine after prepped and draped in the usual sterile fashion. Puncture was performed at L4-5 using a 3 1/2 inch 22-gauge spinal needle via right paramedian approach. Using a single pass through the dura, the needle was placed within the thecal sac, with return of clear CSF. 10 mL of Isovue M-300 was injected into the thecal sac, with normal opacification  of the nerve roots and cauda equina consistent with free flow within the subarachnoid space. The patient was then moved to the trendelenburg position and contrast flowed into the Cervical spine region. I personally performed the lumbar puncture and administered the intrathecal contrast. I also personally supervised acquisition of the myelogram images. TECHNIQUE: Contiguous axial images were obtained through the Cervical and Lumbar spine after the intrathecal infusion of infusion. Coronal and sagittal reconstructions were obtained of the axial image sets. FINDINGS: CERVICAL AND LUMBAR MYELOGRAM FINDINGS: A large left paramedian disc protrusion is present at L3-4. Severe left subarticular narrowing is present on the left at L3-4. There is early truncation of the traversing L5 nerve roots bilaterally. Mild subarticular narrowing is present bilaterally at L2-3. Right subarticular narrowing is present at L1-2. Slight retrolisthesis is present at L3-4. Chronic loss of disc height is present at  L3-4. There is progressive loss of disc height at L2-3 with standing. Slight dynamic anterolisthesis is present at L5-S1 associated with known pars defects. No other significant listhesis is present with flexion or extension. Anterior fusion is present at C5-6. A prominent disc osteophyte complex is present at C6-7. This results in significant foraminal narrowing bilaterally at C6-7. Right-sided foraminal narrowing is present at C3-4 and C4-5. CT CERVICAL MYELOGRAM FINDINGS: The cervical spine is imaged from the skull base through the midbody of T3. There marked endplate sclerotic changes at C3-4 and C4-5. There is fusion across the disc space at C5-6. Extensive endplate sclerotic changes are present at C6-7. There straightening of the normal cervical lordosis. Slight retrolisthesis is present at C3-4 and C4-5. Paraspinous soft tissues are unremarkable. C2-3: Moderate facet hypertrophy present bilaterally. A left paramedian protrusion is present. Uncovertebral spurring contributes to moderate foraminal narrowing bilaterally. C3-4: A broad-based disc osteophyte complex is present. There is distortion of the spinal cord. The canal is narrowed to 6 mm. Severe right and moderate left foraminal narrowing is due to extensive uncovertebral disease. Mild facet hypertrophy is noted bilaterally. C4-5: A broad-based disc osteophyte complex is present. There is partial effacement of the ventral CSF. Canal is narrowed to 6 mm. Uncovertebral spurring contributes to severe foraminal stenosis bilaterally. C5-6: Congenital fusion is present. No significant stenosis is present. C6-7: A broad-based disc osteophyte complex is present. There is effacement of the CSF and slight distortion of the cord. Uncovertebral spurring contributes to severe foraminal narrowing bilaterally. C7-T1: Mild uncovertebral spurring is present bilaterally without significant stenosis. CT LUMBAR MYELOGRAM FINDINGS: Lumbar spine is imaged from T11-12 through  S2-3. Grade 1 anterolisthesis at L5-S1 is associated with bilateral pars defects. The anterolisthesis measures 10 mm. AP alignment is otherwise anatomic. Endplate sclerotic changes are present posteriorly at L3-4, worse on the left. Diffuse endplate sclerotic changes are present at L5-S1. There is a vacuum disc at L3-4, L4-5, and L5-S1. Limited imaging the abdomen demonstrates minimal atherosclerotic calcifications. No aneurysm. Significant adenopathy is present. Paraspinous musculature is within normal limits. L1-2: A large right paramedian disc protrusion is present. Severe right and mild left subarticular narrowing is present. The foramina are patent bilaterally. L2-3: A broad-based disc protrusion is present. Mild facet hypertrophy is noted bilaterally. Mild subarticular narrowing is present bilaterally. Moderate right and mild left foraminal stenosis is present. L3-4: A broad-based disc protrusion is present. Advanced facet hypertrophy is noted bilaterally. This results in severe central canal stenosis. Severe left and moderate right foraminal stenosis is present. L4-5: A broad-based disc protrusion is present. Left laminectomy is noted. Central canal is decompressed posteriorly. Mild  subarticular narrowing is present. Moderate foraminal stenosis is present bilaterally. L5-S1: Moderate facet hypertrophy is noted bilaterally knee. Endplate spurring in combination with the anterolisthesis results in severe left and moderate right foraminal stenosis. A wall the central canal is patent. IMPRESSION: 1. Moderate foraminal stenosis bilaterally at C2-3. 2. Severe central canal stenosis at C3-4 and C4-5. The canal is narrowed to 6 mm at both levels. 3. Severe right moderate left foraminal stenosis at C3-4. 4. Severe foraminal narrowing bilaterally at C4-5. 5. 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 6. Moderate to severe central and severe bilateral foraminal stenosis at C6-7. 7.  Large right paramedian disc protrusion at L1-2 leading to severe right and mild left subarticular stenosis. 8. Mild subarticular stenosis with moderate right and mild left foraminal narrowing at L2-3. 9. Severe central canal stenosis at L3-4 with severe left and moderate right foraminal narrowing. 10. Mild left subarticular narrowing at L4-5 despite previous surgery. 11. Moderate foraminal narrowing bilaterally at L4-5. 12. Bilateral L5 pars defects with 10 mm anterolisthesis at L5-S1. 13. Severe left and moderate right foraminal stenosis at L5-S1. 14. Bilateral L5 pars defects with dynamic listhesis during flexion and extension. Electronically Signed   By: San Morelle M.D.   On: 10/23/2017 15:58   Ct Lumbar Spine W Contrast  Result Date: 10/23/2017 CLINICAL DATA:  Right upper extremity hyper reflexia. Dizziness when looking upwards. Low back pain extending into the left lower extremity. Previous surgeries for similar symptoms. Symptoms have recurred. Left S1 radiculopathy. FLUOROSCOPY TIME:  Radiation Exposure Index (as provided by the fluoroscopic device): 625.97 uGy*m2 If the device does not provide the exposure index: Fluoroscopy Time:  1 minutes 26 seconds Number of Acquired Images:  27 PROCEDURE: LUMBAR PUNCTURE FOR CERVICAL AND LUMBAR MYELOGRAM CERVICAL AND LUMBAR MYELOGRAM CT CERVICAL MYELOGRAM CT LUMBAR MYELOGRAM After thorough discussion of risks and benefits of the procedure including bleeding, infection, injury to nerves, blood vessels, adjacent structures as well as headache and CSF leak, written and oral informed consent was obtained. Consent was obtained by Dr. San Morelle. Patient was positioned prone on the fluoroscopy table. Local anesthesia was provided with 1% lidocaine without epinephrine after prepped and draped in the usual sterile fashion. Puncture was performed at L4-5 using a 3 1/2 inch 22-gauge spinal needle via right paramedian approach. Using a single pass through  the dura, the needle was placed within the thecal sac, with return of clear CSF. 10 mL of Isovue M-300 was injected into the thecal sac, with normal opacification of the nerve roots and cauda equina consistent with free flow within the subarachnoid space. The patient was then moved to the trendelenburg position and contrast flowed into the Cervical spine region. I personally performed the lumbar puncture and administered the intrathecal contrast. I also personally supervised acquisition of the myelogram images. TECHNIQUE: Contiguous axial images were obtained through the Cervical and Lumbar spine after the intrathecal infusion of infusion. Coronal and sagittal reconstructions were obtained of the axial image sets. FINDINGS: CERVICAL AND LUMBAR MYELOGRAM FINDINGS: A large left paramedian disc protrusion is present at L3-4. Severe left subarticular narrowing is present on the left at L3-4. There is early truncation of the traversing L5 nerve roots bilaterally. Mild subarticular narrowing is present bilaterally at L2-3. Right subarticular narrowing is present at L1-2. Slight retrolisthesis is present at L3-4. Chronic loss of disc height is present at L3-4. There is progressive loss of disc height at L2-3 with standing. Slight dynamic anterolisthesis is present at  L5-S1 associated with known pars defects. No other significant listhesis is present with flexion or extension. Anterior fusion is present at C5-6. A prominent disc osteophyte complex is present at C6-7. This results in significant foraminal narrowing bilaterally at C6-7. Right-sided foraminal narrowing is present at C3-4 and C4-5. CT CERVICAL MYELOGRAM FINDINGS: The cervical spine is imaged from the skull base through the midbody of T3. There marked endplate sclerotic changes at C3-4 and C4-5. There is fusion across the disc space at C5-6. Extensive endplate sclerotic changes are present at C6-7. There straightening of the normal cervical lordosis. Slight  retrolisthesis is present at C3-4 and C4-5. Paraspinous soft tissues are unremarkable. C2-3: Moderate facet hypertrophy present bilaterally. A left paramedian protrusion is present. Uncovertebral spurring contributes to moderate foraminal narrowing bilaterally. C3-4: A broad-based disc osteophyte complex is present. There is distortion of the spinal cord. The canal is narrowed to 6 mm. Severe right and moderate left foraminal narrowing is due to extensive uncovertebral disease. Mild facet hypertrophy is noted bilaterally. C4-5: A broad-based disc osteophyte complex is present. There is partial effacement of the ventral CSF. Canal is narrowed to 6 mm. Uncovertebral spurring contributes to severe foraminal stenosis bilaterally. C5-6: Congenital fusion is present. No significant stenosis is present. C6-7: A broad-based disc osteophyte complex is present. There is effacement of the CSF and slight distortion of the cord. Uncovertebral spurring contributes to severe foraminal narrowing bilaterally. C7-T1: Mild uncovertebral spurring is present bilaterally without significant stenosis. CT LUMBAR MYELOGRAM FINDINGS: Lumbar spine is imaged from T11-12 through S2-3. Grade 1 anterolisthesis at L5-S1 is associated with bilateral pars defects. The anterolisthesis measures 10 mm. AP alignment is otherwise anatomic. Endplate sclerotic changes are present posteriorly at L3-4, worse on the left. Diffuse endplate sclerotic changes are present at L5-S1. There is a vacuum disc at L3-4, L4-5, and L5-S1. Limited imaging the abdomen demonstrates minimal atherosclerotic calcifications. No aneurysm. Significant adenopathy is present. Paraspinous musculature is within normal limits. L1-2: A large right paramedian disc protrusion is present. Severe right and mild left subarticular narrowing is present. The foramina are patent bilaterally. L2-3: A broad-based disc protrusion is present. Mild facet hypertrophy is noted bilaterally. Mild  subarticular narrowing is present bilaterally. Moderate right and mild left foraminal stenosis is present. L3-4: A broad-based disc protrusion is present. Advanced facet hypertrophy is noted bilaterally. This results in severe central canal stenosis. Severe left and moderate right foraminal stenosis is present. L4-5: A broad-based disc protrusion is present. Left laminectomy is noted. Central canal is decompressed posteriorly. Mild subarticular narrowing is present. Moderate foraminal stenosis is present bilaterally. L5-S1: Moderate facet hypertrophy is noted bilaterally knee. Endplate spurring in combination with the anterolisthesis results in severe left and moderate right foraminal stenosis. A wall the central canal is patent. IMPRESSION: 1. Moderate foraminal stenosis bilaterally at C2-3. 2. Severe central canal stenosis at C3-4 and C4-5. The canal is narrowed to 6 mm at both levels. 3. Severe right moderate left foraminal stenosis at C3-4. 4. Severe foraminal narrowing bilaterally at C4-5. 5. 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 6. Moderate to severe central and severe bilateral foraminal stenosis at C6-7. 7. Large right paramedian disc protrusion at L1-2 leading to severe right and mild left subarticular stenosis. 8. Mild subarticular stenosis with moderate right and mild left foraminal narrowing at L2-3. 9. Severe central canal stenosis at L3-4 with severe left and moderate right foraminal narrowing. 10. Mild left subarticular narrowing at L4-5 despite previous surgery. 11. Moderate foraminal  narrowing bilaterally at L4-5. 12. Bilateral L5 pars defects with 10 mm anterolisthesis at L5-S1. 13. Severe left and moderate right foraminal stenosis at L5-S1. 14. Bilateral L5 pars defects with dynamic listhesis during flexion and extension. Electronically Signed   By: San Morelle M.D.   On: 10/23/2017 15:58   Dg C-arm 1-60 Min  Result Date: 11/10/2017 CLINICAL  DATA:  Posterior cervical fusion with lateral mass fixation C3-7. Cervical laminectomy. EXAM: CERVICAL SPINE - COMPLETE 4+ VIEW; DG C-ARM 61-120 MIN COMPARISON:  CT myelogram 10/23/2017 FINDINGS: Intraoperative images of the cervical spine demonstrate bilateral lateral mass screws at C3, C4, C5, C6, and C7. Alignment is anatomic. Hardware is intact. IMPRESSION: Posterior fusion of the cervical spine as described. No radiographic evidence for complication. Electronically Signed   By: San Morelle M.D.   On: 11/10/2017 16:38   Dg Myelography Lumbar Inj Multi Region  Result Date: 10/23/2017 CLINICAL DATA:  Right upper extremity hyper reflexia. Dizziness when looking upwards. Low back pain extending into the left lower extremity. Previous surgeries for similar symptoms. Symptoms have recurred. Left S1 radiculopathy. FLUOROSCOPY TIME:  Radiation Exposure Index (as provided by the fluoroscopic device): 625.97 uGy*m2 If the device does not provide the exposure index: Fluoroscopy Time:  1 minutes 26 seconds Number of Acquired Images:  27 PROCEDURE: LUMBAR PUNCTURE FOR CERVICAL AND LUMBAR MYELOGRAM CERVICAL AND LUMBAR MYELOGRAM CT CERVICAL MYELOGRAM CT LUMBAR MYELOGRAM After thorough discussion of risks and benefits of the procedure including bleeding, infection, injury to nerves, blood vessels, adjacent structures as well as headache and CSF leak, written and oral informed consent was obtained. Consent was obtained by Dr. San Morelle. Patient was positioned prone on the fluoroscopy table. Local anesthesia was provided with 1% lidocaine without epinephrine after prepped and draped in the usual sterile fashion. Puncture was performed at L4-5 using a 3 1/2 inch 22-gauge spinal needle via right paramedian approach. Using a single pass through the dura, the needle was placed within the thecal sac, with return of clear CSF. 10 mL of Isovue M-300 was injected into the thecal sac, with normal opacification of  the nerve roots and cauda equina consistent with free flow within the subarachnoid space. The patient was then moved to the trendelenburg position and contrast flowed into the Cervical spine region. I personally performed the lumbar puncture and administered the intrathecal contrast. I also personally supervised acquisition of the myelogram images. TECHNIQUE: Contiguous axial images were obtained through the Cervical and Lumbar spine after the intrathecal infusion of infusion. Coronal and sagittal reconstructions were obtained of the axial image sets. FINDINGS: CERVICAL AND LUMBAR MYELOGRAM FINDINGS: A large left paramedian disc protrusion is present at L3-4. Severe left subarticular narrowing is present on the left at L3-4. There is early truncation of the traversing L5 nerve roots bilaterally. Mild subarticular narrowing is present bilaterally at L2-3. Right subarticular narrowing is present at L1-2. Slight retrolisthesis is present at L3-4. Chronic loss of disc height is present at L3-4. There is progressive loss of disc height at L2-3 with standing. Slight dynamic anterolisthesis is present at L5-S1 associated with known pars defects. No other significant listhesis is present with flexion or extension. Anterior fusion is present at C5-6. A prominent disc osteophyte complex is present at C6-7. This results in significant foraminal narrowing bilaterally at C6-7. Right-sided foraminal narrowing is present at C3-4 and C4-5. CT CERVICAL MYELOGRAM FINDINGS: The cervical spine is imaged from the skull base through the midbody of T3. There marked endplate sclerotic changes at  C3-4 and C4-5. There is fusion across the disc space at C5-6. Extensive endplate sclerotic changes are present at C6-7. There straightening of the normal cervical lordosis. Slight retrolisthesis is present at C3-4 and C4-5. Paraspinous soft tissues are unremarkable. C2-3: Moderate facet hypertrophy present bilaterally. A left paramedian protrusion  is present. Uncovertebral spurring contributes to moderate foraminal narrowing bilaterally. C3-4: A broad-based disc osteophyte complex is present. There is distortion of the spinal cord. The canal is narrowed to 6 mm. Severe right and moderate left foraminal narrowing is due to extensive uncovertebral disease. Mild facet hypertrophy is noted bilaterally. C4-5: A broad-based disc osteophyte complex is present. There is partial effacement of the ventral CSF. Canal is narrowed to 6 mm. Uncovertebral spurring contributes to severe foraminal stenosis bilaterally. C5-6: Congenital fusion is present. No significant stenosis is present. C6-7: A broad-based disc osteophyte complex is present. There is effacement of the CSF and slight distortion of the cord. Uncovertebral spurring contributes to severe foraminal narrowing bilaterally. C7-T1: Mild uncovertebral spurring is present bilaterally without significant stenosis. CT LUMBAR MYELOGRAM FINDINGS: Lumbar spine is imaged from T11-12 through S2-3. Grade 1 anterolisthesis at L5-S1 is associated with bilateral pars defects. The anterolisthesis measures 10 mm. AP alignment is otherwise anatomic. Endplate sclerotic changes are present posteriorly at L3-4, worse on the left. Diffuse endplate sclerotic changes are present at L5-S1. There is a vacuum disc at L3-4, L4-5, and L5-S1. Limited imaging the abdomen demonstrates minimal atherosclerotic calcifications. No aneurysm. Significant adenopathy is present. Paraspinous musculature is within normal limits. L1-2: A large right paramedian disc protrusion is present. Severe right and mild left subarticular narrowing is present. The foramina are patent bilaterally. L2-3: A broad-based disc protrusion is present. Mild facet hypertrophy is noted bilaterally. Mild subarticular narrowing is present bilaterally. Moderate right and mild left foraminal stenosis is present. L3-4: A broad-based disc protrusion is present. Advanced facet  hypertrophy is noted bilaterally. This results in severe central canal stenosis. Severe left and moderate right foraminal stenosis is present. L4-5: A broad-based disc protrusion is present. Left laminectomy is noted. Central canal is decompressed posteriorly. Mild subarticular narrowing is present. Moderate foraminal stenosis is present bilaterally. L5-S1: Moderate facet hypertrophy is noted bilaterally knee. Endplate spurring in combination with the anterolisthesis results in severe left and moderate right foraminal stenosis. A wall the central canal is patent. IMPRESSION: 1. Moderate foraminal stenosis bilaterally at C2-3. 2. Severe central canal stenosis at C3-4 and C4-5. The canal is narrowed to 6 mm at both levels. 3. Severe right moderate left foraminal stenosis at C3-4. 4. Severe foraminal narrowing bilaterally at C4-5. 5. 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 6. Moderate to severe central and severe bilateral foraminal stenosis at C6-7. 7. Large right paramedian disc protrusion at L1-2 leading to severe right and mild left subarticular stenosis. 8. Mild subarticular stenosis with moderate right and mild left foraminal narrowing at L2-3. 9. Severe central canal stenosis at L3-4 with severe left and moderate right foraminal narrowing. 10. Mild left subarticular narrowing at L4-5 despite previous surgery. 11. Moderate foraminal narrowing bilaterally at L4-5. 12. Bilateral L5 pars defects with 10 mm anterolisthesis at L5-S1. 13. Severe left and moderate right foraminal stenosis at L5-S1. 14. Bilateral L5 pars defects with dynamic listhesis during flexion and extension. Electronically Signed   By: San Morelle M.D.   On: 10/23/2017 15:58    Antibiotics:  Anti-infectives (From admission, onward)   Start     Dose/Rate Route Frequency Ordered Stop  11/10/17 2200  ceFAZolin (ANCEF) IVPB 2g/100 mL premix     2 g 200 mL/hr over 30 Minutes Intravenous Every 8  hours 11/10/17 1919 11/11/17 0617   11/10/17 1556  vancomycin (VANCOCIN) powder  Status:  Discontinued       As needed 11/10/17 1556 11/10/17 1630   11/10/17 1438  bacitracin 50,000 Units in sodium chloride 0.9 % 500 mL irrigation  Status:  Discontinued       As needed 11/10/17 1439 11/10/17 1630   11/10/17 1000  ceFAZolin (ANCEF) 3 g in dextrose 5 % 50 mL IVPB     3 g 100 mL/hr over 30 Minutes Intravenous To Short Stay 11/09/17 0903 11/10/17 1412      Discharge Exam: Blood pressure 133/74, pulse 67, temperature 98.4 F (36.9 C), temperature source Oral, resp. rate 18, height 5\' 11"  (1.803 m), weight 120.2 kg (265 lb), SpO2 99 %. Neurologic: Grossly normal Dressing dry  Discharge Medications:   Allergies as of 11/16/2017   No Known Allergies     Medication List    STOP taking these medications   naproxen 500 MG EC tablet Commonly known as:  EC NAPROSYN   naproxen sodium 220 MG tablet Commonly known as:  ALEVE   traMADol 50 MG tablet Commonly known as:  ULTRAM     TAKE these medications   amLODipine 10 MG tablet Commonly known as:  NORVASC Take 10 mg by mouth daily.   aspirin 81 MG tablet Take 81 mg by mouth daily.   BYSTOLIC 5 MG tablet Generic drug:  nebivolol TAKE 1 TABLET BY MOUTH EVERY DAY   gabapentin 300 MG capsule Commonly known as:  NEURONTIN Take 300 mg by mouth at bedtime.   metFORMIN 500 MG 24 hr tablet Commonly known as:  GLUCOPHAGE-XR Take 1,000 mg by mouth 2 (two) times daily.   methocarbamol 500 MG tablet Commonly known as:  ROBAXIN Take 1 tablet (500 mg total) by mouth every 6 (six) hours as needed for muscle spasms.   multivitamin with minerals tablet Take 1 tablet by mouth daily.   naloxegol oxalate 25 MG Tabs tablet Commonly known as:  MOVANTIK Take 1 tablet (25 mg total) by mouth daily. Start taking on:  11/17/2017   Oxycodone HCl 10 MG Tabs Take 1 tablet (10 mg total) by mouth every 6 (six) hours as needed for severe pain ((score 7  to 10)).   simvastatin 40 MG tablet Commonly known as:  ZOCOR Take 40 mg by mouth daily.   valsartan-hydrochlorothiazide 320-25 MG tablet Commonly known as:  DIOVAN-HCT Take 1 tablet by mouth daily.       Disposition: home   Final Dx: CL/ PCF C3-7  Discharge Instructions    Call MD for:  difficulty breathing, headache or visual disturbances   Complete by:  As directed    Call MD for:  persistant nausea and vomiting   Complete by:  As directed    Call MD for:  redness, tenderness, or signs of infection (pain, swelling, redness, odor or green/yellow discharge around incision site)   Complete by:  As directed    Call MD for:  severe uncontrolled pain   Complete by:  As directed    Call MD for:  temperature >100.4   Complete by:  As directed    Diet - low sodium heart healthy   Complete by:  As directed    Increase activity slowly   Complete by:  As directed    Remove dressing  in 48 hours   Complete by:  As directed          Signed: Dua Mehler S 11/16/2017, 10:50 AM

## 2017-11-28 ENCOUNTER — Other Ambulatory Visit: Payer: Self-pay

## 2017-11-28 ENCOUNTER — Encounter (HOSPITAL_COMMUNITY): Payer: Self-pay | Admitting: Anesthesiology

## 2017-11-28 ENCOUNTER — Emergency Department (HOSPITAL_COMMUNITY): Payer: BLUE CROSS/BLUE SHIELD | Admitting: Anesthesiology

## 2017-11-28 ENCOUNTER — Encounter (HOSPITAL_COMMUNITY): Admission: EM | Disposition: A | Discharge status: Home / Self Care | Payer: Self-pay | Source: Home / Self Care

## 2017-11-28 ENCOUNTER — Emergency Department (HOSPITAL_COMMUNITY): Payer: BLUE CROSS/BLUE SHIELD

## 2017-11-28 ENCOUNTER — Inpatient Hospital Stay (HOSPITAL_COMMUNITY)
Admission: EM | Admit: 2017-11-28 | Discharge: 2017-12-04 | DRG: 356 | Disposition: A | Discharge status: Home / Self Care | Payer: BLUE CROSS/BLUE SHIELD | Attending: Surgery | Admitting: Surgery

## 2017-11-28 DIAGNOSIS — E785 Hyperlipidemia, unspecified: Secondary | ICD-10-CM | POA: Diagnosis present

## 2017-11-28 DIAGNOSIS — Z9079 Acquired absence of other genital organ(s): Secondary | ICD-10-CM

## 2017-11-28 DIAGNOSIS — T402X5A Adverse effect of other opioids, initial encounter: Secondary | ICD-10-CM | POA: Diagnosis present

## 2017-11-28 DIAGNOSIS — D649 Anemia, unspecified: Secondary | ICD-10-CM | POA: Diagnosis present

## 2017-11-28 DIAGNOSIS — E875 Hyperkalemia: Secondary | ICD-10-CM | POA: Diagnosis present

## 2017-11-28 DIAGNOSIS — I10 Essential (primary) hypertension: Secondary | ICD-10-CM | POA: Diagnosis present

## 2017-11-28 DIAGNOSIS — G4733 Obstructive sleep apnea (adult) (pediatric): Secondary | ICD-10-CM | POA: Diagnosis present

## 2017-11-28 DIAGNOSIS — Z4659 Encounter for fitting and adjustment of other gastrointestinal appliance and device: Secondary | ICD-10-CM

## 2017-11-28 DIAGNOSIS — K5903 Drug induced constipation: Secondary | ICD-10-CM | POA: Diagnosis present

## 2017-11-28 DIAGNOSIS — E119 Type 2 diabetes mellitus without complications: Secondary | ICD-10-CM | POA: Diagnosis present

## 2017-11-28 DIAGNOSIS — Z5331 Laparoscopic surgical procedure converted to open procedure: Secondary | ICD-10-CM

## 2017-11-28 DIAGNOSIS — K255 Chronic or unspecified gastric ulcer with perforation: Secondary | ICD-10-CM | POA: Diagnosis not present

## 2017-11-28 DIAGNOSIS — K259 Gastric ulcer, unspecified as acute or chronic, without hemorrhage or perforation: Secondary | ICD-10-CM | POA: Diagnosis present

## 2017-11-28 DIAGNOSIS — K631 Perforation of intestine (nontraumatic): Secondary | ICD-10-CM | POA: Diagnosis present

## 2017-11-28 DIAGNOSIS — F17210 Nicotine dependence, cigarettes, uncomplicated: Secondary | ICD-10-CM | POA: Diagnosis present

## 2017-11-28 DIAGNOSIS — Z8546 Personal history of malignant neoplasm of prostate: Secondary | ICD-10-CM

## 2017-11-28 DIAGNOSIS — I2510 Atherosclerotic heart disease of native coronary artery without angina pectoris: Secondary | ICD-10-CM | POA: Diagnosis present

## 2017-11-28 DIAGNOSIS — K659 Peritonitis, unspecified: Secondary | ICD-10-CM | POA: Diagnosis present

## 2017-11-28 DIAGNOSIS — Z981 Arthrodesis status: Secondary | ICD-10-CM

## 2017-11-28 HISTORY — PX: LAPAROSCOPY: SHX197

## 2017-11-28 HISTORY — PX: LAPAROTOMY: SHX154

## 2017-11-28 LAB — URINALYSIS, ROUTINE W REFLEX MICROSCOPIC
Bacteria, UA: NONE SEEN
Bilirubin Urine: NEGATIVE
GLUCOSE, UA: NEGATIVE mg/dL
Hgb urine dipstick: NEGATIVE
Ketones, ur: 20 mg/dL — AB
Leukocytes, UA: NEGATIVE
NITRITE: NEGATIVE
PH: 5 (ref 5.0–8.0)
Protein, ur: 100 mg/dL — AB
Specific Gravity, Urine: 1.024 (ref 1.005–1.030)

## 2017-11-28 LAB — CBC
HCT: 40.2 % (ref 39.0–52.0)
HEMOGLOBIN: 12.9 g/dL — AB (ref 13.0–17.0)
MCH: 29.9 pg (ref 26.0–34.0)
MCHC: 32.1 g/dL (ref 30.0–36.0)
MCV: 93.3 fL (ref 78.0–100.0)
Platelets: 431 10*3/uL — ABNORMAL HIGH (ref 150–400)
RBC: 4.31 MIL/uL (ref 4.22–5.81)
RDW: 12.5 % (ref 11.5–15.5)
WBC: 14.8 10*3/uL — ABNORMAL HIGH (ref 4.0–10.5)

## 2017-11-28 LAB — TYPE AND SCREEN
ABO/RH(D): A NEG
ANTIBODY SCREEN: NEGATIVE

## 2017-11-28 LAB — COMPREHENSIVE METABOLIC PANEL
ALBUMIN: 3 g/dL — AB (ref 3.5–5.0)
ALK PHOS: 92 U/L (ref 38–126)
ALT: 40 U/L (ref 17–63)
ANION GAP: 14 (ref 5–15)
AST: 20 U/L (ref 15–41)
BILIRUBIN TOTAL: 0.6 mg/dL (ref 0.3–1.2)
BUN: 27 mg/dL — ABNORMAL HIGH (ref 6–20)
CO2: 24 mmol/L (ref 22–32)
CREATININE: 1.37 mg/dL — AB (ref 0.61–1.24)
Calcium: 9.1 mg/dL (ref 8.9–10.3)
Chloride: 96 mmol/L — ABNORMAL LOW (ref 101–111)
GFR calc non Af Amer: 53 mL/min — ABNORMAL LOW (ref >60)
GLUCOSE: 132 mg/dL — AB (ref 65–99)
Potassium: 4.2 mmol/L (ref 3.5–5.1)
Sodium: 134 mmol/L — ABNORMAL LOW (ref 135–145)
TOTAL PROTEIN: 6.7 g/dL (ref 6.5–8.1)

## 2017-11-28 LAB — SALICYLATE LEVEL

## 2017-11-28 LAB — LIPASE, BLOOD: Lipase: 22 U/L (ref 11–51)

## 2017-11-28 SURGERY — LAPAROSCOPY, DIAGNOSTIC
Anesthesia: General | Site: Abdomen

## 2017-11-28 MED ORDER — IOHEXOL 300 MG/ML  SOLN
100.0000 mL | Freq: Once | INTRAMUSCULAR | Status: AC | PRN
  Administered 2017-11-28: 100 mL via INTRAVENOUS

## 2017-11-28 MED ORDER — MIDAZOLAM HCL 2 MG/2ML IJ SOLN
INTRAMUSCULAR | Status: AC
  Filled 2017-11-28: qty 2

## 2017-11-28 MED ORDER — FENTANYL CITRATE (PF) 250 MCG/5ML IJ SOLN
INTRAMUSCULAR | Status: AC
  Filled 2017-11-28: qty 5

## 2017-11-28 MED ORDER — PROPOFOL 10 MG/ML IV BOLUS
INTRAVENOUS | Status: AC
  Filled 2017-11-28: qty 20

## 2017-11-28 MED ORDER — CEFOTETAN DISODIUM-DEXTROSE 2-2.08 GM-%(50ML) IV SOLR
2.0000 g | INTRAVENOUS | Status: AC
  Administered 2017-11-29: 2 g via INTRAVENOUS
  Filled 2017-11-28: qty 50

## 2017-11-28 MED ORDER — KETAMINE HCL 100 MG/ML IJ SOLN
INTRAMUSCULAR | Status: AC
Start: 2017-11-28 — End: ?
  Filled 2017-11-28: qty 1

## 2017-11-28 SURGICAL SUPPLY — 85 items
APL SKNCLS STERI-STRIP NONHPOA (GAUZE/BANDAGES/DRESSINGS) ×2
BENZOIN TINCTURE PRP APPL 2/3 (GAUZE/BANDAGES/DRESSINGS) ×4 IMPLANT
BLADE CLIPPER SURG (BLADE) ×3 IMPLANT
BNDG GAUZE ELAST 4 BULKY (GAUZE/BANDAGES/DRESSINGS) ×3 IMPLANT
CANISTER SUCT 3000ML PPV (MISCELLANEOUS) ×4 IMPLANT
CATH FOLEY 2WAY SLVR  5CC 20FR (CATHETERS)
CATH FOLEY 2WAY SLVR  5CC 22FR (CATHETERS)
CATH FOLEY 2WAY SLVR 5CC 20FR (CATHETERS) IMPLANT
CATH FOLEY 2WAY SLVR 5CC 22FR (CATHETERS) IMPLANT
CHLORAPREP W/TINT 26ML (MISCELLANEOUS) ×4 IMPLANT
COVER SURGICAL LIGHT HANDLE (MISCELLANEOUS) ×4 IMPLANT
DRAIN CHANNEL 19F RND (DRAIN) ×3 IMPLANT
DRAIN PENROSE 1/2X36 STERILE (WOUND CARE) ×1 IMPLANT
DRAPE LAPAROSCOPIC ABDOMINAL (DRAPES) ×4 IMPLANT
DRAPE UTILITY XL STRL (DRAPES) ×8 IMPLANT
DRAPE WARM FLUID 44X44 (DRAPE) ×4 IMPLANT
DRSG OPSITE POSTOP 4X10 (GAUZE/BANDAGES/DRESSINGS) IMPLANT
DRSG OPSITE POSTOP 4X8 (GAUZE/BANDAGES/DRESSINGS) IMPLANT
ELECT BLADE 6.5 EXT (BLADE) ×4 IMPLANT
ELECT CAUTERY BLADE 6.4 (BLADE) ×4 IMPLANT
ELECT REM PT RETURN 9FT ADLT (ELECTROSURGICAL) ×4
ELECTRODE REM PT RTRN 9FT ADLT (ELECTROSURGICAL) ×2 IMPLANT
EVACUATOR SILICONE 100CC (DRAIN) ×3 IMPLANT
GAUZE SPONGE 2X2 8PLY STRL LF (GAUZE/BANDAGES/DRESSINGS) ×2 IMPLANT
GAUZE SPONGE 4X4 12PLY STRL (GAUZE/BANDAGES/DRESSINGS) ×4 IMPLANT
GAUZE SPONGE 4X4 16PLY XRAY LF (GAUZE/BANDAGES/DRESSINGS) ×3 IMPLANT
GLOVE BIO SURGEON STRL SZ7.5 (GLOVE) ×4 IMPLANT
GLOVE BIOGEL PI IND STRL 8 (GLOVE) ×2 IMPLANT
GLOVE BIOGEL PI INDICATOR 8 (GLOVE) ×2
GLOVE ECLIPSE 7.5 STRL STRAW (GLOVE) ×4 IMPLANT
GOWN STRL REUS W/ TWL LRG LVL3 (GOWN DISPOSABLE) ×6 IMPLANT
GOWN STRL REUS W/ TWL XL LVL3 (GOWN DISPOSABLE) ×2 IMPLANT
GOWN STRL REUS W/TWL LRG LVL3 (GOWN DISPOSABLE) ×12
GOWN STRL REUS W/TWL XL LVL3 (GOWN DISPOSABLE) ×4
HANDLE SUCTION POOLE (INSTRUMENTS) ×1 IMPLANT
KIT BASIN OR (CUSTOM PROCEDURE TRAY) ×4 IMPLANT
KIT TURNOVER KIT B (KITS) ×4 IMPLANT
LIGASURE IMPACT 36 18CM CVD LR (INSTRUMENTS) ×4 IMPLANT
NDL HYPO 25GX1X1/2 BEV (NEEDLE) IMPLANT
NDL INSUFFLATION 14GA 120MM (NEEDLE) ×1 IMPLANT
NEEDLE HYPO 25GX1X1/2 BEV (NEEDLE) ×4 IMPLANT
NEEDLE INSUFFLATION 14GA 120MM (NEEDLE) ×4 IMPLANT
NS IRRIG 1000ML POUR BTL (IV SOLUTION) ×8 IMPLANT
PACK GENERAL/GYN (CUSTOM PROCEDURE TRAY) ×4 IMPLANT
PAD ABD 8X10 STRL (GAUZE/BANDAGES/DRESSINGS) ×3 IMPLANT
PAD ARMBOARD 7.5X6 YLW CONV (MISCELLANEOUS) ×8 IMPLANT
RELOAD AUTO 90-3.5 TA90 BLE (ENDOMECHANICALS) IMPLANT
RELOAD AUTO 90-4.8 TA90 GRN (ENDOMECHANICALS) IMPLANT
RELOAD STAPLE 90 BLU REG (ENDOMECHANICALS) IMPLANT
RELOAD STAPLE 90 GRN THCK DST (ENDOMECHANICALS) IMPLANT
SCISSORS LAP 5X35 DISP (ENDOMECHANICALS) ×3 IMPLANT
SET IRRIG TUBING LAPAROSCOPIC (IRRIGATION / IRRIGATOR) IMPLANT
SLEEVE ENDOPATH XCEL 5M (ENDOMECHANICALS) ×7 IMPLANT
SPECIMEN JAR LARGE (MISCELLANEOUS) ×4 IMPLANT
SPONGE GAUZE 2X2 STER 10/PKG (GAUZE/BANDAGES/DRESSINGS) ×2
SPONGE LAP 18X18 X RAY DECT (DISPOSABLE) IMPLANT
STAPLER 90 3.5 STAND SLIM (STAPLE)
STAPLER 90 3.5 STD SLIM (STAPLE) IMPLANT
STAPLER TA90 4.8 THK SLIMI (STAPLE) IMPLANT
STAPLER VISISTAT 35W (STAPLE) ×1 IMPLANT
SUCTION POOLE HANDLE (INSTRUMENTS) ×4
SUCTION POOLE TIP (SUCTIONS) ×4 IMPLANT
SUT ETHILON 3 0 PS 1 (SUTURE) ×3 IMPLANT
SUT MNCRL AB 4-0 PS2 18 (SUTURE) ×4 IMPLANT
SUT PDS AB 1 TP1 96 (SUTURE) ×8 IMPLANT
SUT SILK 2 0 SH CR/8 (SUTURE) ×4 IMPLANT
SUT SILK 2 0 TIES 10X30 (SUTURE) ×4 IMPLANT
SUT SILK 3 0 SH CR/8 (SUTURE) ×4 IMPLANT
SUT SILK 3 0 TIES 10X30 (SUTURE) ×4 IMPLANT
SUT VICRYL 0 UR6 27IN ABS (SUTURE) ×3 IMPLANT
SYR CONTROL 10ML LL (SYRINGE) ×3 IMPLANT
SYRINGE IRR TOOMEY STRL 70CC (SYRINGE) ×3 IMPLANT
TAPE CLOTH SURG 6X10 WHT LF (GAUZE/BANDAGES/DRESSINGS) ×3 IMPLANT
TOWEL OR 17X24 6PK STRL BLUE (TOWEL DISPOSABLE) ×4 IMPLANT
TOWEL OR 17X26 10 PK STRL BLUE (TOWEL DISPOSABLE) ×4 IMPLANT
TRAY FOLEY MTR SLVR 14FR STAT (SET/KITS/TRAYS/PACK) ×4 IMPLANT
TRAY FOLEY MTR SLVR 16FR STAT (SET/KITS/TRAYS/PACK) ×4 IMPLANT
TRAY LAPAROSCOPIC MC (CUSTOM PROCEDURE TRAY) ×4 IMPLANT
TROCAR XCEL 12X100 BLDLESS (ENDOMECHANICALS) IMPLANT
TROCAR XCEL BLUNT TIP 100MML (ENDOMECHANICALS) IMPLANT
TROCAR XCEL NON-BLD 11X100MML (ENDOMECHANICALS) IMPLANT
TROCAR XCEL NON-BLD 5MMX100MML (ENDOMECHANICALS) ×4 IMPLANT
TUBE MOSS GAS 18FR (TUBING) IMPLANT
TUBING INSUFFLATION (TUBING) ×4 IMPLANT
YANKAUER SUCT BULB TIP NO VENT (SUCTIONS) ×1 IMPLANT

## 2017-11-28 NOTE — ED Provider Notes (Signed)
[facility] EMERGENC[facility] DEPART[facility] Provider Note   CSN: [id number] Arrival date & time: 11/28/17  1844     History   Chief Complaint Chief Complaint  Patient presents with  . Abdominal Pain    HPI James Lee is a 67 y.o. male.  HPI   Patient is a 67 year old male presenting with epigastric pain.  Patient had lower back pain starting around March.  At that time patient was taking a lot of Naprosyn and Aleve.  Over 1000 mg a day.  Patient noticed vision because of epigastric pain and physician stopped the naproxen.  Patient was put on disability.  He went to go have a cervical spine fusion done by Dr. Ronnald Ramp.  Since then he has had severe constipation with continued epigastric pain.  Patient had several episodes of retching and vomiting last night.  Normal stool yesterday afternoon.  Past Medical History:  Diagnosis Date  . Colon polyps   . Diabetes mellitus without complication ([place])   . Dyslipidemia   . Hyperlipidemia   . Hypertension   . LBP (low back pain)   . Prostate cancer ([place])   . Sleep apnea     Patient Active Problem List   Diagnosis Date Noted  . Cervical vertebral fusion 11/10/2017  . Coronary artery calcification seen on CAT scan 09/02/2014  . Benign essential HTN 09/02/2014  . Hyperlipemia 09/02/2014  . Sleep apnea   . Pulmonary nodules 09/01/2014  . DOE (dyspnea on exertion) 09/01/2014    Past Surgical History:  Procedure Laterality Date  . BACK SURGERY  1996  . MENISCUS REPAIR Right   . POSTERIOR CERVICAL FUSION/FORAMINOTOMY N/A 11/10/2017   Procedure: Posterior cervical fusion with lateral mass fixation Cervical Three-Seven, cervical laminectomy Cervical Three- Seven;  Surgeon: Eustace Moore, MD;  Location: [facility];  Service: Neurosurgery;  Laterality: N/A;  Posterior cervical fusion with lateral mass fixation Cervical Three-Seven, cervical laminectomy Cervical Three- Seven  . PROSTATECTOMY  01/2012  . TONSILLECTOMY    . TOOTH  EXTRACTION          Home Medications    Prior to Admission medications   Medication Sig Start Date End Date Taking? Authorizing Provider  amLODipine (NORVASC) 10 MG tablet Take 10 mg by mouth daily.   Yes [provider]  aspirin 81 MG tablet Take 81 mg by mouth daily.   Yes [provider]  docusate sodium (COLACE) 100 MG capsule Take 100 mg by mouth 2 (two) times daily.   Yes [provider]  gabapentin (NEURONTIN) 300 MG capsule Take 300 mg by mouth at bedtime.  10/08/15  Yes [provider]  metFORMIN (GLUCOPHAGE) 500 MG tablet Take 1,000 mg by mouth 2 (two) times daily with a meal.   Yes [provider]  methocarbamol (ROBAXIN) 500 MG tablet Take 1 tablet (500 mg total) by mouth every 6 (six) hours as needed for muscle spasms. 11/16/17  Yes Eustace Moore, MD  Multiple Vitamins-Minerals (MULTIVITAMIN WITH MINERALS) tablet Take 1 tablet by mouth daily.   Yes [provider]  naloxegol oxalate (MOVANTIK) 25 MG TABS tablet Take 1 tablet (25 mg total) by mouth daily. 11/17/17  Yes Eustace Moore, MD  naproxen (NAPROSYN) 500 MG tablet Take 500 mg by mouth 2 (two) times daily with a meal.   Yes [provider]  nebivolol (BYSTOLIC) 5 MG tablet TAKE 1 TABLET BY MOUTH EVERY DAY 09/02/17  Yes [provider]  simvastatin (ZOCOR) 40 MG tablet  Take 40 mg by mouth daily.    Yes [provider]  valsartan-hydrochlorothiazide (DIOVAN-HCT) 320-25 MG tablet Take 1 tablet by mouth daily. 10/19/15  Yes [provider]  oxyCODONE 10 MG TABS Take 1 tablet (10 mg total) by mouth every 6 (six) hours as needed for severe pain ((score 7 to 10)). Patient not taking: Reported on 11/28/2017 11/16/17   Eustace Moore, MD    Family History Family History  Problem Relation Age of Onset  . Throat cancer Father   . Cancer Father   . Coronary artery disease Mother     Social History Social History   Tobacco Use  . Smoking  status: Current Every Day Smoker    Packs/day: 1.00    Years: 40.00    Pack years: 40.00    Types: Cigarettes  . Smokeless tobacco: Never Used  Substance Use Topics  . Alcohol use: No    Alcohol/week: 0.0 oz    Comment: rarely  . Drug use: No     Allergies   Patient has no known allergies.   Review of Systems Review of Systems  Constitutional: Negative for activity change.  Respiratory: Negative for shortness of breath.   Cardiovascular: Negative for chest pain.  Gastrointestinal: Positive for abdominal pain. Negative for blood in stool, constipation and diarrhea.     Physical Exam Updated Vital Signs BP 132/85 (BP Location: Right Arm)   Pulse 87   Temp 98.8 F (37.1 C)   Resp 16   SpO2 100%   Physical Exam  Constitutional: He is oriented to person, place, and time. He appears well-nourished.  HENT:  Head: Normocephalic.  Eyes: Conjunctivae are normal.  Cardiovascular: Normal rate and regular rhythm.  Pulmonary/Chest: Effort normal and breath sounds normal.  Abdominal: Normal appearance. There is tenderness in the epigastric area.  Neurological: He is oriented to person, place, and time.  Skin: Skin is warm and dry. He is not diaphoretic.  Psychiatric: He has a normal mood and affect. His behavior is normal.     ED Treatments / Results  Labs (all labs ordered are listed, but only abnormal results are displayed) Labs Reviewed  COMPREHENSIVE METABOLIC PANEL - Abnormal; Notable for the following components:      Result Value   Sodium 134 (*)    Chloride 96 (*)    Glucose, Bld 132 (*)    BUN 27 (*)    Creatinine, Ser 1.37 (*)    Albumin 3.0 (*)    GFR calc non Af Amer 53 (*)    All other components within normal limits  CBC - Abnormal; Notable for the following components:   WBC 14.8 (*)    Hemoglobin 12.9 (*)    Platelets 431 (*)    All other components within normal limits  URINALYSIS, ROUTINE W REFLEX MICROSCOPIC - Abnormal; Notable for the  following components:   Color, Urine AMBER (*)    APPearance HAZY (*)    Ketones, ur 20 (*)    Protein, ur 100 (*)    All other components within normal limits  LIPASE, BLOOD  SALICYLATE LEVEL  POC OCCULT BLOOD, ED  TYPE AND SCREEN    EKG None  Radiology Ct Abdomen Pelvis W Contrast  Result Date: 11/28/2017 CLINICAL DATA:  67 year old male with mid and upper abdominal pain, progressive for 3 weeks. Nausea vomiting. EXAM: CT ABDOMEN AND PELVIS WITH CONTRAST TECHNIQUE: Multidetector CT imaging of the abdomen and pelvis was performed using the standard protocol following  bolus administration of intravenous contrast. CONTRAST:  16mL OMNIPAQUE IOHEXOL 300 MG/ML  SOLN COMPARISON:  Lumbar spine CT myelogram 10/23/2017. CT Abdomen and Pelvis 11/04/2010. FINDINGS: Lower chest: Stable and normal lung bases. No pericardial or pleural effusion. Hepatobiliary: Negative liver and gallbladder. Pancreas: No pancreatic inflammation or parenchymal abnormality. Spleen: Negative. Adrenals/Urinary Tract: Normal adrenal glands. Bilateral renal enhancement and contrast excretion is symmetric and normal. Decompressed ureters. Decompressed and unremarkable urinary bladder. Stomach/Bowel: Negative rectum. Negative sigmoid colon aside from redundancy. Occasional diverticula in the descending colon, no active inflammation. Negative transverse colon. Redundant hepatic flexure. Negative right colon and appendix. Negative terminal ileum. No dilated small bowel. The gastric cardia and proximal body appear normal, but the distal gastric body is highly abnormal with what appears to be a contained gastric perforation with inflammation along both the lesser and greater curve of the stomach. An extraluminal air and fluid collection tracks to the greater curve on series 3, image 33 and encompasses 11 x 3.7 x 4.3 centimeters. This is posterior to the true gastric lumen. Abnormal appearance of the gastric wall with perigastric  inflammation continues to the antrum and duodenum bulb, and there could be a 2nd smaller contained perforation along the medial antrum on series 3, image 28. There is thickening of the duodenum bulb. The remainder of the duodenal C-loop appears more normal. No associated free intraperitoneal air or fluid. There is an enlarged peripancreatic lymph node measuring 15 millimeter short axis on series 3, image 34. Vascular/Lymphatic: Aortoiliac calcified atherosclerosis. The major arterial structures in the abdomen and pelvis are patent. Mild perigastric and gastrohepatic ligament lymphadenopathy appears to be reactive. Reproductive: Penile implant partially visible. Other: No pelvic free fluid. Musculoskeletal: Advanced lumbar spine disc and endplate degeneration at L3-L4 and L5-S1, progressed at the former since 2012. Superimposed chronic L5-S1 spondylolysis and spondylolisthesis. No acute osseous abnormality identified. IMPRESSION: 1. Highly abnormal stomach with what appears to be a large 11 cm contained gastric perforation situated posterior to the true gastric lumen primarily along the lesser curve. See series 3, image 25. Associated perigastric inflammation, but no free intraperitoneal air or fluid. Reactive regional lymph nodes. Second smaller gastric antrum ulceration or contained perforation (series 3, image 28). 2. Inflamed appearance of the duodenum bulb, but the remainder of the duodenal C-loop and the remaining bowel is negative. Normal appendix. 3. Normal lung bases. Electronically Signed   By: Genevie Ann M.D.   On: 11/28/2017 22:05    Procedures Procedures (including critical care time)  Medications Ordered in ED Medications  iohexol (OMNIPAQUE) 300 MG/ML solution 100 mL (100 mLs Intravenous Contrast Given 11/28/17 2139)     Initial Impression / Assessment and Plan / ED Course  I have reviewed the triage vital signs and the nursing notes.  Pertinent labs & imaging results that were available  during my care of the patient were reviewed by me and considered in my medical decision making (see chart for details).     Patient is a 67 year old male presenting with epigastric pain.  Patient had lower back pain starting around March.  At that time patient was taking a lot of Naprosyn and Aleve.  Over 1000 mg a day.  Patient noticed vision because of epigastric pain and physician stopped the naproxen.  Patient was put on disability.  He went to go have a cervical spine fusion done by Dr. Ronnald Ramp.  Since then he has had severe constipation with continued epigastric pain.  Patient had several episodes of retching and vomiting  last night.  Normal stool yesterday afternoon.   11:46 PM called by radiologist.  Patient has walled off perforation of his stomach.  Patient has no white count, normal vital signs.  Discussed with Dr. Rosendo Gros, he will bring to the operating room.  Final Clinical Impressions(s) / ED Diagnoses   Final diagnoses:  None    ED Discharge Orders    None       Macarthur Critchley, MD 11/28/17 [phone]

## 2017-11-28 NOTE — ED Provider Notes (Signed)
Patient placed in Quick Look pathway, seen and evaluated   Chief Complaint: constipation, abdominal pain  HPI:   Patient presents to ED for evaluation of ongoing upper abdominal pain for the past several weeks. He has had a decrease in bowel movements as well. He has been taking a medication for opioid induced constipation, stool softener and mag citrate to help. He is s/p cervical fusion surgery at the end of last month. States his bowels never fully recovered or went back to normal. Seen by surgeon yesterday and had abdominal XR done. Was not told of results. Pain worsened today. Had a watery bowel movement today. One episode of NBNB emesis today. No blood in stool reported. Of note, patient had been taking >2000mg  of naproxen for his chronic back and neck pain prior to his surgery. He has since stopped. He feels like he is dehydrated, urinating less. Denies chest pain or SOB.  ROS: abdominal pain.  Physical Exam:   Gen: No distress  Neuro: Awake and Alert  Skin: Warm    Focused Exam: epigastric/RUQ TTP with no rebound or guarding. RRR. Lungs CTAB.   Initiation of care has begun. The patient has been counseled on the process, plan, and necessity for staying for the completion/evaluation, and the remainder of the medical screening examination    Delia Heady, PA-C 11/28/17 2015    Macarthur Critchley, MD 11/28/17 2357

## 2017-11-28 NOTE — H&P (Signed)
James Lee is an 67 y.o. male.   Chief Complaint: Abdominal pain HPI: Dr. Thomasene Lot referred. Patient is a 67 year old male, with a history of dyspnea on exertion, CAD, hypertension, hyperlipidemia, diabetes and recently underwent cervical fusion by Dr. Ronnald Ramp.  Patient states over the last 2 to 3 weeks she has had abdominal pain.  In discussing with him and his wife the patient was taken approximately 1000 mg of naproxen as well as 6 pills of Aleve per day.  Patient states he was recently seen by his PCP and was told to stop this over the last several weeks.  Patient has been taking OxyContin for pain secondary to her cervical fusion however has been not taking this over the last 2 to 3 days.    He comes in today with a 10-hour history of abdominal pain.  Patient states that the pain was in the epigastrium.  Patient states that secondary to continued abdominal pain he presented to the ER.  Upon evaluation in the ED he underwent CT scan which revealed a likely contained perforation of the gastrum as well as possible duodenum.  I did reveal the studies personally.  There is no free air however there appear to be extraluminal perforation.  Patient did have elevated WBC count, and creatinine.  Secondary to the CT scan findings general surgery was consulted for further evaluation and management.  Past Medical History:  Diagnosis Date  . Colon polyps   . Diabetes mellitus without complication ([place])   . Dyslipidemia   . Hyperlipidemia   . Hypertension   . LBP (low back pain)   . Prostate cancer ([place])   . Sleep apnea     Past Surgical History:  Procedure Laterality Date  . BACK SURGERY  1996  . MENISCUS REPAIR Right   . POSTERIOR CERVICAL FUSION/FORAMINOTOMY N/A 11/10/2017   Procedure: Posterior cervical fusion with lateral mass fixation Cervical Three-Seven, cervical laminectomy Cervical Three- Seven;  Surgeon: Eustace Moore, MD;  Location: [facility];  Service: Neurosurgery;  Laterality: N/A;  Posterior  cervical fusion with lateral mass fixation Cervical Three-Seven, cervical laminectomy Cervical Three- Seven  . PROSTATECTOMY  01/2012  . TONSILLECTOMY    . TOOTH EXTRACTION      Family History  Problem Relation Age of Onset  . Throat cancer Father   . Cancer Father   . Coronary artery disease Mother    Social History:  reports that he has been smoking cigarettes.  He has a 40.00 pack-year smoking history. He has never used smokeless tobacco. He reports that he does not drink alcohol or use drugs.  Allergies: No Known Allergies   (Not in a hospital admission)  Results for orders placed or performed during the hospital encounter of 11/28/17 (from the past 48 hour(s))  Urinalysis, Routine w reflex microscopic     Status: Abnormal   Collection Time: 11/28/17  7:47 PM  Result Value Ref Range   Color, Urine AMBER (A) YELLOW    Comment: BIOCHEMICALS MAY BE AFFECTED BY COLOR   APPearance HAZY (A) CLEAR   Specific Gravity, Urine 1.024 1.005 - 1.030   pH 5.0 5.0 - 8.0   Glucose, UA NEGATIVE NEGATIVE mg/dL   Hgb urine dipstick NEGATIVE NEGATIVE   Bilirubin Urine NEGATIVE NEGATIVE   Ketones, ur 20 (A) NEGATIVE mg/dL   Protein, ur 100 (A) NEGATIVE mg/dL   Nitrite NEGATIVE NEGATIVE   Leukocytes, UA NEGATIVE NEGATIVE   RBC / HPF 0-5 0 - 5 RBC/hpf   WBC,  UA 0-5 0 - 5 WBC/hpf   Bacteria, UA NONE SEEN NONE SEEN   Squamous Epithelial / LPF 0-5 0 - 5   Mucus PRESENT    Hyaline Casts, UA PRESENT     Comment: Performed at [facility], [place] [address], [place], [place] [address]  Type and screen [place]     Status: None   Collection Time: 11/28/17  7:49 PM  Result Value Ref Range   ABO/RH(D) A NEG    Antibody Screen NEG    Sample Expiration      12/01/2017 Performed at [facility], [place] [address], [place], [place] [address]   Lipase, blood     Status: None   Collection Time: 11/28/17  7:52 PM  Result Value Ref Range   Lipase 22 11 - 51 U/L    Comment:  Performed at [facility] [address] [place], [place] [address]  Comprehensive metabolic panel     Status: Abnormal   Collection Time: 11/28/17  7:52 PM  Result Value Ref Range   Sodium 134 (L) 135 - 145 mmol/L   Potassium 4.2 3.5 - 5.1 mmol/L   Chloride 96 (L) 101 - 111 mmol/L   CO2 24 22 - 32 mmol/L   Glucose, Bld 132 (H) 65 - 99 mg/dL   BUN 27 (H) 6 - 20 mg/dL   Creatinine, Ser 1.37 (H) 0.61 - 1.24 mg/dL   Calcium 9.1 8.9 - 10.3 mg/dL   Total Protein 6.7 6.5 - 8.1 g/dL   Albumin 3.0 (L) 3.5 - 5.0 g/dL   AST 20 15 - 41 U/L   ALT 40 17 - 63 U/L   Alkaline Phosphatase 92 38 - 126 U/L   Total Bilirubin 0.6 0.3 - 1.2 mg/dL   GFR calc non Af Amer 53 (L) >60 mL/min   GFR calc Af Amer >60 >60 mL/min    Comment: (NOTE) The eGFR has been calculated using the CKD EPI equation. This calculation has not been validated in all clinical situations. eGFR's persistently <60 mL/min signify possible Chronic Kidney Disease.    Anion gap 14 5 - 15    Comment: Performed at [facility] [address] [place], [place] [address]  CBC     Status: Abnormal   Collection Time: 11/28/17  7:52 PM  Result Value Ref Range   WBC 14.8 (H) 4.0 - 10.5 K/uL   RBC 4.31 4.22 - 5.81 MIL/uL   Hemoglobin 12.9 (L) 13.0 - 17.0 g/dL   HCT 40.2 39.0 - 52.0 %   MCV 93.3 78.0 - 100.0 fL   MCH 29.9 26.0 - 34.0 pg   MCHC 32.1 30.0 - 36.0 g/dL   RDW 12.5 11.5 - 15.5 %   Platelets 431 (H) 150 - 400 K/uL    Comment: Performed at [facility] [address], [place], [place] [address]  Salicylate level     Status: None   Collection Time: 11/28/17  8:19 PM  Result Value Ref Range   Salicylate Lvl <9.3 2.8 - 30.0 mg/dL    Comment: Performed at [facility] [address], [place], [place] [address]   Ct Abdomen Pelvis W Contrast  Result Date: 11/28/2017 CLINICAL DATA:  67 year old male with mid and upper abdominal pain, progressive for 3 weeks. Nausea vomiting. EXAM: CT ABDOMEN AND  PELVIS WITH CONTRAST TECHNIQUE: Multidetector CT imaging of the abdomen and pelvis was performed using the  standard protocol following bolus administration of intravenous contrast. CONTRAST:  138m OMNIPAQUE IOHEXOL 300 MG/ML  SOLN COMPARISON:  Lumbar spine CT myelogram 10/23/2017. CT Abdomen and Pelvis 11/04/2010. FINDINGS: Lower chest: Stable and normal lung bases. No pericardial or pleural effusion. Hepatobiliary: Negative liver and gallbladder. Pancreas: No pancreatic inflammation or parenchymal abnormality. Spleen: Negative. Adrenals/Urinary Tract: Normal adrenal glands. Bilateral renal enhancement and contrast excretion is symmetric and normal. Decompressed ureters. Decompressed and unremarkable urinary bladder. Stomach/Bowel: Negative rectum. Negative sigmoid colon aside from redundancy. Occasional diverticula in the descending colon, no active inflammation. Negative transverse colon. Redundant hepatic flexure. Negative right colon and appendix. Negative terminal ileum. No dilated small bowel. The gastric cardia and proximal body appear normal, but the distal gastric body is highly abnormal with what appears to be a contained gastric perforation with inflammation along both the lesser and greater curve of the stomach. An extraluminal air and fluid collection tracks to the greater curve on series 3, image 33 and encompasses 11 x 3.7 x 4.3 centimeters. This is posterior to the true gastric lumen. Abnormal appearance of the gastric wall with perigastric inflammation continues to the antrum and duodenum bulb, and there could be a 2nd smaller contained perforation along the medial antrum on series 3, image 28. There is thickening of the duodenum bulb. The remainder of the duodenal C-loop appears more normal. No associated free intraperitoneal air or fluid. There is an enlarged peripancreatic lymph node measuring 15 millimeter short axis on series 3, image 34. Vascular/Lymphatic: Aortoiliac calcified  atherosclerosis. The major arterial structures in the abdomen and pelvis are patent. Mild perigastric and gastrohepatic ligament lymphadenopathy appears to be reactive. Reproductive: Penile implant partially visible. Other: No pelvic free fluid. Musculoskeletal: Advanced lumbar spine disc and endplate degeneration at L3-L4 and L5-S1, progressed at the former since 2012. Superimposed chronic L5-S1 spondylolysis and spondylolisthesis. No acute osseous abnormality identified. IMPRESSION: 1. Highly abnormal stomach with what appears to be a large 11 cm contained gastric perforation situated posterior to the true gastric lumen primarily along the lesser curve. See series 3, image 25. Associated perigastric inflammation, but no free intraperitoneal air or fluid. Reactive regional lymph nodes. Second smaller gastric antrum ulceration or contained perforation (series 3, image 28). 2. Inflamed appearance of the duodenum bulb, but the remainder of the duodenal C-loop and the remaining bowel is negative. Normal appendix. 3. Normal lung bases. Electronically Signed   By: HGenevie AnnM.D.   On: 11/28/2017 22:05    Review of Systems  Constitutional: Negative for chills, fever and malaise/fatigue.  HENT: Negative for ear discharge, hearing loss and sore throat.   Eyes: Negative for blurred vision and discharge.  Respiratory: Negative for cough and shortness of breath.   Cardiovascular: Negative for chest pain, orthopnea and leg swelling.  Gastrointestinal: Positive for abdominal pain, constipation, nausea and vomiting. Negative for diarrhea and heartburn.  Musculoskeletal: Negative for myalgias and neck pain.  Skin: Negative for itching and rash.  Neurological: Negative for dizziness, focal weakness, seizures and loss of consciousness.  Endo/Heme/Allergies: Negative for environmental allergies. Does not bruise/bleed easily.  Psychiatric/Behavioral: Negative for depression and suicidal ideas.  All other systems reviewed  and are negative.   Blood pressure 132/85, pulse 87, temperature 98.8 F (37.1 C), resp. rate 16, SpO2 100 %. Physical Exam  Constitutional: He is oriented to person, place, and time. Vital signs are normal. He appears well-developed and well-nourished.  Conversant No acute distress  Eyes: Lids are normal. No scleral icterus.  No lid  lag Moist conjunctiva  Neck: Normal range of motion. Neck supple. No tracheal tenderness present. No thyromegaly present.  No cervical lymphadenopathy  Cardiovascular: Normal rate, regular rhythm and intact distal pulses.  No murmur heard. Respiratory: Effort normal and breath sounds normal. He has no wheezes. He has no rales.  GI: Soft. He exhibits distension. He exhibits no mass. There is no hepatosplenomegaly. There is tenderness (epigastrum). There is rebound. There is no guarding. No hernia.  Neurological: He is alert and oriented to person, place, and time.  Normal gait and station  Skin: Skin is warm. No rash noted. No cyanosis. Nails show no clubbing.  Normal skin turgor  Psychiatric: Judgment normal.  Appropriate affect     Assessment/Plan 67 year old male with likely perforated gastric ulcer and perforated duodenal ulcer patient currently with peritonitis. CAD Dyspnea on exertion Hyperlipidemia Hypertension Diabetes  Plan: 1.  We will proceed to the operating room emergently for diagnostic laparoscopy possible exploratory laparotomy.  I discussed with him as a possibility for the need for gastric resection. 2.  Discussed with him the risks and benefits of the procedure to include but not limited to: Infection, bleeding, damage structures, possible need for further surgery.  The patient voiced understanding wishes to proceed.  Reyes Ivan, MD 11/28/2017, 11:02 PM

## 2017-11-28 NOTE — ED Triage Notes (Signed)
Patient to ED c/o generalized (mostly upper mid abdominal pain) x 3 weeks, progressively worsening. Patient had cervical fusion about a month ago and followed up with surgeon yesterday, who did an abdominal x-ray, but never said anything. Patient states he's having to do magnesium citrate d/t constipation, and states he has had a dark stool following surgery. He denies fevers/chills, but reports his bowel movements have not been normal. Also states he took a lot of naproxen for chronic pain and was told it could tear his stomach up.

## 2017-11-28 NOTE — Anesthesia Preprocedure Evaluation (Addendum)
Anesthesia Evaluation  Patient identified by MRN, date of birth, ID band Patient awake    Reviewed: Allergy & Precautions, NPO status , Patient's Chart, lab work & pertinent test results  History of Anesthesia Complications Negative for: history of anesthetic complications  Airway Mallampati: II  TM Distance: >3 FB Neck ROM: Full    Dental  (+) Partial Upper, Dental Advisory Given, Poor Dentition   Pulmonary sleep apnea , Current Smoker,    Pulmonary exam normal        Cardiovascular hypertension, Pt. on medications Normal cardiovascular exam     Neuro/Psych negative neurological ROS  negative psych ROS   GI/Hepatic Neg liver ROS,   Endo/Other  diabetes, Type 2, Oral Hypoglycemic AgentsMorbid obesity  Renal/GU negative Renal ROS     Musculoskeletal   Abdominal   Peds  Hematology   Anesthesia Other Findings   Reproductive/Obstetrics                           Anesthesia Physical  Anesthesia Plan  ASA: III and emergent  Anesthesia Plan: General   Post-op Pain Management:    Induction: Intravenous, Rapid sequence and Cricoid pressure planned  PONV Risk Score and Plan: 2 and Ondansetron, Dexamethasone and Diphenhydramine  Airway Management Planned: Oral ETT  Additional Equipment:   Intra-op Plan:   Post-operative Plan: Extubation in OR  Informed Consent: I have reviewed the patients History and Physical, chart, labs and discussed the procedure including the risks, benefits and alternatives for the proposed anesthesia with the patient or authorized representative who has indicated his/her understanding and acceptance.   Dental advisory given  Plan Discussed with: CRNA, Surgeon and Anesthesiologist  Anesthesia Plan Comments:        Anesthesia Quick Evaluation

## 2017-11-29 ENCOUNTER — Encounter (HOSPITAL_COMMUNITY): Payer: Self-pay | Admitting: General Surgery

## 2017-11-29 ENCOUNTER — Other Ambulatory Visit: Payer: Self-pay

## 2017-11-29 ENCOUNTER — Inpatient Hospital Stay (HOSPITAL_COMMUNITY): Payer: BLUE CROSS/BLUE SHIELD

## 2017-11-29 DIAGNOSIS — E119 Type 2 diabetes mellitus without complications: Secondary | ICD-10-CM | POA: Diagnosis present

## 2017-11-29 DIAGNOSIS — Z8546 Personal history of malignant neoplasm of prostate: Secondary | ICD-10-CM | POA: Diagnosis not present

## 2017-11-29 DIAGNOSIS — E875 Hyperkalemia: Secondary | ICD-10-CM | POA: Diagnosis present

## 2017-11-29 DIAGNOSIS — Z5331 Laparoscopic surgical procedure converted to open procedure: Secondary | ICD-10-CM | POA: Diagnosis not present

## 2017-11-29 DIAGNOSIS — Z981 Arthrodesis status: Secondary | ICD-10-CM | POA: Diagnosis not present

## 2017-11-29 DIAGNOSIS — D649 Anemia, unspecified: Secondary | ICD-10-CM | POA: Diagnosis present

## 2017-11-29 DIAGNOSIS — K631 Perforation of intestine (nontraumatic): Secondary | ICD-10-CM | POA: Diagnosis present

## 2017-11-29 DIAGNOSIS — K5903 Drug induced constipation: Secondary | ICD-10-CM | POA: Diagnosis present

## 2017-11-29 DIAGNOSIS — E785 Hyperlipidemia, unspecified: Secondary | ICD-10-CM | POA: Diagnosis present

## 2017-11-29 DIAGNOSIS — G4733 Obstructive sleep apnea (adult) (pediatric): Secondary | ICD-10-CM | POA: Diagnosis present

## 2017-11-29 DIAGNOSIS — I2510 Atherosclerotic heart disease of native coronary artery without angina pectoris: Secondary | ICD-10-CM | POA: Diagnosis present

## 2017-11-29 DIAGNOSIS — K259 Gastric ulcer, unspecified as acute or chronic, without hemorrhage or perforation: Secondary | ICD-10-CM | POA: Diagnosis present

## 2017-11-29 DIAGNOSIS — F17210 Nicotine dependence, cigarettes, uncomplicated: Secondary | ICD-10-CM | POA: Diagnosis present

## 2017-11-29 DIAGNOSIS — K659 Peritonitis, unspecified: Secondary | ICD-10-CM | POA: Diagnosis present

## 2017-11-29 DIAGNOSIS — I10 Essential (primary) hypertension: Secondary | ICD-10-CM | POA: Diagnosis present

## 2017-11-29 DIAGNOSIS — T402X5A Adverse effect of other opioids, initial encounter: Secondary | ICD-10-CM | POA: Diagnosis present

## 2017-11-29 DIAGNOSIS — Z9079 Acquired absence of other genital organ(s): Secondary | ICD-10-CM | POA: Diagnosis not present

## 2017-11-29 DIAGNOSIS — K255 Chronic or unspecified gastric ulcer with perforation: Secondary | ICD-10-CM | POA: Diagnosis present

## 2017-11-29 HISTORY — DX: Gastric ulcer, unspecified as acute or chronic, without hemorrhage or perforation: K25.9

## 2017-11-29 HISTORY — DX: Perforation of intestine (nontraumatic): K63.1

## 2017-11-29 LAB — GLUCOSE, CAPILLARY
GLUCOSE-CAPILLARY: 127 mg/dL — AB (ref 65–99)
GLUCOSE-CAPILLARY: 115 mg/dL — AB (ref 65–99)
Glucose-Capillary: 122 mg/dL — ABNORMAL HIGH (ref 65–99)
Glucose-Capillary: 124 mg/dL — ABNORMAL HIGH (ref 65–99)

## 2017-11-29 MED ORDER — MIDAZOLAM HCL 5 MG/5ML IJ SOLN
INTRAMUSCULAR | Status: DC | PRN
  Administered 2017-11-28 (×2): 1 mg via INTRAVENOUS

## 2017-11-29 MED ORDER — 0.9 % SODIUM CHLORIDE (POUR BTL) OPTIME
TOPICAL | Status: DC | PRN
  Administered 2017-11-29: 2000 mL
  Administered 2017-11-29: 1000 mL

## 2017-11-29 MED ORDER — LIDOCAINE HCL (CARDIAC) PF 100 MG/5ML IV SOSY
PREFILLED_SYRINGE | INTRAVENOUS | Status: DC | PRN
  Administered 2017-11-28: 80 mg via INTRATRACHEAL

## 2017-11-29 MED ORDER — PROPOFOL 10 MG/ML IV BOLUS
INTRAVENOUS | Status: DC | PRN
  Administered 2017-11-28: 180 mg via INTRAVENOUS

## 2017-11-29 MED ORDER — PHENYLEPHRINE 40 MCG/ML (10ML) SYRINGE FOR IV PUSH (FOR BLOOD PRESSURE SUPPORT)
PREFILLED_SYRINGE | INTRAVENOUS | Status: AC
  Filled 2017-11-29: qty 10

## 2017-11-29 MED ORDER — HYDROMORPHONE HCL 2 MG/ML IJ SOLN
INTRAMUSCULAR | Status: AC
  Filled 2017-11-29: qty 1

## 2017-11-29 MED ORDER — ONDANSETRON HCL 4 MG/2ML IJ SOLN: 4.0000 mg | Freq: Four times a day (QID) | INTRAMUSCULAR | Status: DC | PRN

## 2017-11-29 MED ORDER — KETAMINE HCL 10 MG/ML IJ SOLN
INTRAMUSCULAR | Status: DC | PRN
  Administered 2017-11-29: 20 mg via INTRAVENOUS
  Administered 2017-11-29: 100 mg via INTRAVENOUS

## 2017-11-29 MED ORDER — FAMOTIDINE IN NACL 20-0.9 MG/50ML-% IV SOLN
20.0000 mg | Freq: Two times a day (BID) | INTRAVENOUS | Status: DC
  Administered 2017-11-29 – 2017-12-02 (×9): 20 mg via INTRAVENOUS
  Filled 2017-11-29 (×9): qty 50

## 2017-11-29 MED ORDER — ONDANSETRON HCL 4 MG/2ML IJ SOLN
INTRAMUSCULAR | Status: AC
  Filled 2017-11-29: qty 2

## 2017-11-29 MED ORDER — BUPIVACAINE HCL 0.25 % IJ SOLN
INTRAMUSCULAR | Status: DC | PRN
  Administered 2017-11-29: 6 mL

## 2017-11-29 MED ORDER — LACTATED RINGERS IV SOLN
INTRAVENOUS | Status: DC | PRN
  Administered 2017-11-28 – 2017-11-29 (×2): via INTRAVENOUS

## 2017-11-29 MED ORDER — ALBUMIN HUMAN 5 % IV SOLN
INTRAVENOUS | Status: DC | PRN
  Administered 2017-11-29 (×2): via INTRAVENOUS

## 2017-11-29 MED ORDER — HYDROMORPHONE HCL 2 MG/ML IJ SOLN
0.2500 mg | INTRAMUSCULAR | Status: DC | PRN
  Administered 2017-11-29 (×4): 0.5 mg via INTRAVENOUS

## 2017-11-29 MED ORDER — POTASSIUM CHLORIDE IN NACL 40-0.9 MEQ/L-% IV SOLN
INTRAVENOUS | Status: DC
  Administered 2017-11-29 – 2017-11-30 (×4): 125 mL/h via INTRAVENOUS
  Filled 2017-11-29 (×4): qty 1000

## 2017-11-29 MED ORDER — SUGAMMADEX SODIUM 500 MG/5ML IV SOLN
INTRAVENOUS | Status: AC
  Filled 2017-11-29: qty 5

## 2017-11-29 MED ORDER — GUAIFENESIN-DM 100-10 MG/5ML PO SYRP
10.0000 mL | ORAL_SOLUTION | ORAL | Status: DC | PRN
  Administered 2017-11-29 – 2017-11-30 (×3): 10 mL via ORAL
  Filled 2017-11-29 (×3): qty 10

## 2017-11-29 MED ORDER — ROCURONIUM BROMIDE 100 MG/10ML IV SOLN
INTRAVENOUS | Status: DC | PRN
  Administered 2017-11-29: 20 mg via INTRAVENOUS
  Administered 2017-11-29: 40 mg via INTRAVENOUS

## 2017-11-29 MED ORDER — BISACODYL 10 MG RE SUPP: 10.0000 mg | Freq: Every day | RECTAL | Status: DC | PRN

## 2017-11-29 MED ORDER — INSULIN ASPART 100 UNIT/ML ~~LOC~~ SOLN
0.0000 [IU] | Freq: Three times a day (TID) | SUBCUTANEOUS | Status: DC
  Administered 2017-11-29 – 2017-11-30 (×3): 3 [IU] via SUBCUTANEOUS
  Administered 2017-12-01: 4 [IU] via SUBCUTANEOUS
  Administered 2017-12-02 – 2017-12-03 (×2): 3 [IU] via SUBCUTANEOUS
  Administered 2017-12-03: 4 [IU] via SUBCUTANEOUS

## 2017-11-29 MED ORDER — FENTANYL CITRATE (PF) 250 MCG/5ML IJ SOLN
INTRAMUSCULAR | Status: DC | PRN
  Administered 2017-11-28: 100 ug via INTRAVENOUS
  Administered 2017-11-28 – 2017-11-29 (×3): 50 ug via INTRAVENOUS

## 2017-11-29 MED ORDER — SUCCINYLCHOLINE CHLORIDE 20 MG/ML IJ SOLN
INTRAMUSCULAR | Status: DC | PRN
  Administered 2017-11-28: 120 mg via INTRAVENOUS

## 2017-11-29 MED ORDER — PROMETHAZINE HCL 25 MG/ML IJ SOLN: 6.2500 mg | INTRAMUSCULAR | Status: DC | PRN

## 2017-11-29 MED ORDER — SODIUM CHLORIDE 0.9 % IV SOLN
100.0000 mg | INTRAVENOUS | Status: DC
  Administered 2017-11-29 – 2017-12-03 (×5): 100 mg via INTRAVENOUS
  Filled 2017-11-29 (×6): qty 100

## 2017-11-29 MED ORDER — ARTIFICIAL TEARS OPHTHALMIC OINT
TOPICAL_OINTMENT | OPHTHALMIC | Status: DC | PRN
  Administered 2017-11-28: 1 via OPHTHALMIC

## 2017-11-29 MED ORDER — PHENOL 1.4 % MT LIQD
1.0000 | OROMUCOSAL | Status: DC | PRN
  Administered 2017-11-29: 1 via OROMUCOSAL
  Filled 2017-11-29: qty 177

## 2017-11-29 MED ORDER — KETOROLAC TROMETHAMINE 30 MG/ML IJ SOLN
30.0000 mg | Freq: Four times a day (QID) | INTRAMUSCULAR | Status: AC
  Administered 2017-11-29: 30 mg via INTRAVENOUS
  Filled 2017-11-29: qty 1

## 2017-11-29 MED ORDER — SODIUM CHLORIDE 0.9 % IJ SOLN
INTRAMUSCULAR | Status: AC
  Filled 2017-11-29: qty 10

## 2017-11-29 MED ORDER — HYDROMORPHONE HCL 2 MG/ML IJ SOLN
0.2500 mg | INTRAMUSCULAR | Status: DC | PRN
  Administered 2017-11-29 (×2): 0.5 mg via INTRAVENOUS

## 2017-11-29 MED ORDER — KETOROLAC TROMETHAMINE 30 MG/ML IJ SOLN: 30.0000 mg | Freq: Four times a day (QID) | INTRAMUSCULAR | Status: DC | PRN

## 2017-11-29 MED ORDER — BUPIVACAINE-EPINEPHRINE 0.25% -1:200000 IJ SOLN
INTRAMUSCULAR | Status: AC
Start: 2017-11-29 — End: ?
  Filled 2017-11-29: qty 1

## 2017-11-29 MED ORDER — ONDANSETRON HCL 4 MG/2ML IJ SOLN
INTRAMUSCULAR | Status: DC | PRN
  Administered 2017-11-29: 4 mg via INTRAVENOUS

## 2017-11-29 MED ORDER — HYDROMORPHONE HCL 1 MG/ML IJ SOLN
1.0000 mg | INTRAMUSCULAR | Status: DC | PRN
  Administered 2017-11-29 – 2017-11-30 (×12): 2 mg via INTRAVENOUS
  Filled 2017-11-29 (×13): qty 2

## 2017-11-29 MED ORDER — SUGAMMADEX SODIUM 200 MG/2ML IV SOLN
INTRAVENOUS | Status: DC | PRN
  Administered 2017-11-29: 200 mg via INTRAVENOUS

## 2017-11-29 MED ORDER — ONDANSETRON 4 MG PO TBDP
4.0000 mg | ORAL_TABLET | Freq: Four times a day (QID) | ORAL | Status: DC | PRN
Start: 2017-11-29 — End: 2017-12-04

## 2017-11-29 MED ORDER — PIPERACILLIN-TAZOBACTAM 3.375 G IVPB
3.3750 g | Freq: Three times a day (TID) | INTRAVENOUS | Status: DC
  Administered 2017-11-29 – 2017-12-04 (×15): 3.375 g via INTRAVENOUS
  Filled 2017-11-29 (×17): qty 50

## 2017-11-29 MED ORDER — HYDRALAZINE HCL 20 MG/ML IJ SOLN
10.0000 mg | Freq: Four times a day (QID) | INTRAMUSCULAR | Status: DC | PRN
Start: 2017-11-29 — End: 2017-12-04

## 2017-11-29 MED ORDER — ENOXAPARIN SODIUM 40 MG/0.4ML ~~LOC~~ SOLN
40.0000 mg | SUBCUTANEOUS | Status: DC
  Administered 2017-11-30 – 2017-12-04 (×5): 40 mg via SUBCUTANEOUS
  Filled 2017-11-29 (×5): qty 0.4

## 2017-11-29 MED ORDER — SUCCINYLCHOLINE CHLORIDE 200 MG/10ML IV SOSY
PREFILLED_SYRINGE | INTRAVENOUS | Status: AC
  Filled 2017-11-29: qty 10

## 2017-11-29 MED ORDER — PHENYLEPHRINE HCL 10 MG/ML IJ SOLN
INTRAMUSCULAR | Status: DC | PRN
  Administered 2017-11-29: 40 ug via INTRAVENOUS

## 2017-11-29 NOTE — Progress Notes (Signed)
pts wife Stanton Kidney updated via phone

## 2017-11-29 NOTE — Progress Notes (Signed)
POCT H.Pylori screen only able to be done at [facility] per [facility] of Care coordinator. [employer].

## 2017-11-29 NOTE — Progress Notes (Signed)
Pharmacy Antibiotic Note  James Lee is a 67 y.o. male admitted on 11/28/2017 with infected bowel perf ulcer.  Pharmacy has been consulted for Zosyn dosing.  Plan: Start Zosyn 3.375 gm IV q8h (4 hour infusion) Eraxis added as well per surgery Monitor clinical picture, renal function F/U C&S, abx deescalation / LOT   Height: 5\' 11"  (180.3 cm) Weight: 255 lb 11.7 oz (116 kg) IBW/kg (Calculated) : 75.3  Temp (24hrs), Avg:98.5 F (36.9 C), Min:97.7 F (36.5 C), Max:98.8 F (37.1 C)  Recent Labs  Lab 11/28/17 1952  WBC 14.8*  CREATININE 1.37*    Estimated Creatinine Clearance: 70.6 mL/min (A) (by C-G formula based on SCr of 1.37 mg/dL (H)).    No Known Allergies  Thank you for allowing pharmacy to be a part of this patient's care.  Reginia Naas 11/29/2017 10:43 AM

## 2017-11-29 NOTE — Progress Notes (Signed)
Patient ID: James Lee, male   DOB: December 06, 1950, 67 y.o.   MRN: [id number]    1 Day Post-Op  Subjective: Pt having some pain, but not out of proportion though.  Has a mask on for O2 as he is a mouth breather and sats fell some while sleeping.  Doesn't like NGT.  RN states she isn't sure NGT is in correct position.  Objective: Vital signs in last 24 hours: Temp:  [97.7 F (36.5 C)-98.8 F (37.1 C)] 98.8 F (37.1 C) (06/19 0812) Pulse Rate:  [70-87] 70 (06/19 0812) Resp:  [13-18] 18 (06/19 0812) BP: (116-145)/(47-98) 135/66 (06/19 0812) SpO2:  [91 %-100 %] 95 % (06/19 0812) Weight:  [116 kg (255 lb 11.7 oz)] 116 kg (255 lb 11.7 oz) (06/19 0719)    Intake/Output from previous day: 06/18 0701 - 06/19 0700 In: 2175 [I.V.:1675; IV Piggyback:500] Out: 508 [Urine:350; Drains:58; Blood:100] Intake/Output this shift: No intake/output data recorded.  PE: Heart: regular LungS: CTAB Abd: soft, obese, few BS, midline wound is clean and packed, JP drain with about 50cc present currently of serosang output.  Lab Results:  Recent Labs    11/28/17 1952  WBC 14.8*  HGB 12.9*  HCT 40.2  PLT 431*   BMET Recent Labs    11/28/17 1952  NA 134*  K 4.2  CL 96*  CO2 24  GLUCOSE 132*  BUN 27*  CREATININE 1.37*  CALCIUM 9.1   PT/INR No results for input(s): LABPROT, INR in the last 72 hours. CMP     Component Value Date/Time   NA 134 (L) 11/28/2017 1952   K 4.2 11/28/2017 1952   CL 96 (L) 11/28/2017 1952   CO2 24 11/28/2017 1952   GLUCOSE 132 (H) 11/28/2017 1952   BUN 27 (H) 11/28/2017 1952   CREATININE 1.37 (H) 11/28/2017 1952   CALCIUM 9.1 11/28/2017 1952   PROT 6.7 11/28/2017 1952   ALBUMIN 3.0 (L) 11/28/2017 1952   AST 20 11/28/2017 1952   ALT 40 11/28/2017 1952   ALKPHOS 92 11/28/2017 1952   BILITOT 0.6 11/28/2017 1952   GFRNONAA 53 (L) 11/28/2017 1952   GFRAA >60 11/28/2017 1952   Lipase     Component Value Date/Time   LIPASE 22 11/28/2017 1952        Studies/Results: Ct Abdomen Pelvis W Contrast  Result Date: 11/28/2017 CLINICAL DATA:  67 year old male with mid and upper abdominal pain, progressive for 3 weeks. Nausea vomiting. EXAM: CT ABDOMEN AND PELVIS WITH CONTRAST TECHNIQUE: Multidetector CT imaging of the abdomen and pelvis was performed using the standard protocol following bolus administration of intravenous contrast. CONTRAST:  163mL OMNIPAQUE IOHEXOL 300 MG/ML  SOLN COMPARISON:  Lumbar spine CT myelogram 10/23/2017. CT Abdomen and Pelvis 11/04/2010. FINDINGS: Lower chest: Stable and normal lung bases. No pericardial or pleural effusion. Hepatobiliary: Negative liver and gallbladder. Pancreas: No pancreatic inflammation or parenchymal abnormality. Spleen: Negative. Adrenals/Urinary Tract: Normal adrenal glands. Bilateral renal enhancement and contrast excretion is symmetric and normal. Decompressed ureters. Decompressed and unremarkable urinary bladder. Stomach/Bowel: Negative rectum. Negative sigmoid colon aside from redundancy. Occasional diverticula in the descending colon, no active inflammation. Negative transverse colon. Redundant hepatic flexure. Negative right colon and appendix. Negative terminal ileum. No dilated small bowel. The gastric cardia and proximal body appear normal, but the distal gastric body is highly abnormal with what appears to be a contained gastric perforation with inflammation along both the lesser and greater curve of the stomach. An extraluminal air and fluid collection tracks to  the greater curve on series 3, image 33 and encompasses 11 x 3.7 x 4.3 centimeters. This is posterior to the true gastric lumen. Abnormal appearance of the gastric wall with perigastric inflammation continues to the antrum and duodenum bulb, and there could be a 2nd smaller contained perforation along the medial antrum on series 3, image 28. There is thickening of the duodenum bulb. The remainder of the duodenal C-loop appears more  normal. No associated free intraperitoneal air or fluid. There is an enlarged peripancreatic lymph node measuring 15 millimeter short axis on series 3, image 34. Vascular/Lymphatic: Aortoiliac calcified atherosclerosis. The major arterial structures in the abdomen and pelvis are patent. Mild perigastric and gastrohepatic ligament lymphadenopathy appears to be reactive. Reproductive: Penile implant partially visible. Other: No pelvic free fluid. Musculoskeletal: Advanced lumbar spine disc and endplate degeneration at L3-L4 and L5-S1, progressed at the former since 2012. Superimposed chronic L5-S1 spondylolysis and spondylolisthesis. No acute osseous abnormality identified. IMPRESSION: 1. Highly abnormal stomach with what appears to be a large 11 cm contained gastric perforation situated posterior to the true gastric lumen primarily along the lesser curve. See series 3, image 25. Associated perigastric inflammation, but no free intraperitoneal air or fluid. Reactive regional lymph nodes. Second smaller gastric antrum ulceration or contained perforation (series 3, image 28). 2. Inflamed appearance of the duodenum bulb, but the remainder of the duodenal C-loop and the remaining bowel is negative. Normal appendix. 3. Normal lung bases. Electronically Signed   By: Genevie Ann M.D.   On: 11/28/2017 22:05   Dg Abd Portable 1v  Result Date: 11/29/2017 CLINICAL DATA:  67 y/o  M; nasogastric tube placement. EXAM: PORTABLE ABDOMEN - 1 VIEW COMPARISON:  11/28/2017 CT abdomen and pelvis. FINDINGS: Drain projecting over the left upper quadrant. Nasogastric tube tip projects over gastric body. Normal bowel gas pattern. Bones are unremarkable. IMPRESSION: Nasogastric tube tip projects over gastric body. Electronically Signed   By: Kristine Garbe M.D.   On: 11/29/2017 02:19    Anti-infectives: Anti-infectives (From admission, onward)   Start     Dose/Rate Route Frequency Ordered Stop   11/28/17 2315  cefoTEtan in  Dextrose 5% (CEFOTAN) IVPB 2 g     2 g 100 mL/hr over 30 Minutes Intravenous STAT 11/28/17 2309 11/29/17 0005       Assessment/Plan  Perforated gastric ulcer, POD 1, s/p ex lap with drainage of abscess and JP drain placement, Dr. Rosendo Gros 6-18  -check NGT placement, if malpositioned, may DC as opposed to trying to replace it after just having had a perforation of his gastric mucosa -OOB to chair and begin to mobilize -NS WD dressing changes BID -pulm toilet -on pepcid -check h pylori, but suspect given pre-op NSAID use this was secondary to NSAIDs. -will do upper endoscopy as outpatient to rule out malignancy  DM -on SSI HTN -prns ordered IV for now given NPO status OSA Recent neck surgery  FEN - NPO, NGT, IVFs VTE - SCDs/Lovenox ID - Zosyn, ? Need for antifungal coverage given foregut infection?   LOS: 0 days    Henreitta Cea , [facility] 11/29/2017, 9:50 AM Pager: [phone]

## 2017-11-29 NOTE — Transfer of Care (Signed)
Immediate Anesthesia Transfer of Care Note  Patient: James Lee  Procedure(s) Performed: LAPAROSCOPY DIAGNOSTIC converted to open (N/A Abdomen) EXPLORATORY LAPAROTOMY (N/A Abdomen)  Patient Location: PACU  Anesthesia Type:General  Level of Consciousness: drowsy  Airway & Oxygen Therapy: Patient Spontanous Breathing and Patient connected to face mask oxygen  Post-op Assessment: Report given to RN and Post -op Vital signs reviewed and stable  Post vital signs: Reviewed and stable  Last Vitals:  Vitals Value Taken Time  BP 133/63 11/29/2017  1:28 AM  Temp 36.5 C 11/29/2017  1:26 AM  Pulse 76 11/29/2017  1:30 AM  Resp 14 11/29/2017  1:30 AM  SpO2 93 % 11/29/2017  1:30 AM  Vitals shown include unvalidated device data.  Last Pain:  Vitals:   11/28/17 2209  PainSc: 8          Complications: No apparent anesthesia complications

## 2017-11-29 NOTE — Op Note (Signed)
11/29/2017  1:19 AM  PATIENT:  James Lee  67 y.o. male  PRE-OPERATIVE DIAGNOSIS:  perforated ulcer  POST-OPERATIVE DIAGNOSIS:  Purulent contained perforated ulcer  PROCEDURE:  Procedure(s): LAPAROSCOPY DIAGNOSTIC (N/A) EXPLORATORY LAPAROTOMY (N/A)  SURGEON:  Surgeon(s) and Role:    Ralene Ok, MD - Primary   ANESTHESIA:   local and general  EBL:  100 mL   BLOOD ADMINISTERED:none  DRAINS: (19Fr) Jackson-Pratt drain(s) with closed bulb suction in the Lesser Sac   LOCAL MEDICATIONS USED:  BUPIVICAINE   SPECIMEN:  No Specimen  DISPOSITION OF SPECIMEN:  N/A  COUNTS:  YES  TOURNIQUET:  * No tourniquets in log *  DICTATION: .Dragon Dictation Indication of procedure: Patient is a 67 year old male who came in with epigastric abdominal pain with history of excessive NSAID use over the last several months.  Patient underwent CT scan which revealed a likely perforated gastric ulcer which was contained.  On exam patient had peritonitis and thus we proceeded to the operating room for diagnostic laparoscopy and exploratory laparotomy.  Findings: Patient had a large pocket of purulence in the lesser sac.  There is large amount of inflammation and induration of the posterior gastric wall.  There is no perforation that could be seen.  A bubble test was used to help find a posterior wall/lesser curvature perforation however there was no perforation that could be seen.  A 19 [place] Blake drain was placed in the lesser sac and brought out the right lower quadrant stab incision.  Details of procedure: After the patient was consented he was taken back to the operating room and placed in the supine position with bilateral SCDs in place.  After appropriate antibiotics were confirmed a timeout was called and all facts verified.  There is needle technique was used to insufflate the abdomen to 15 mmHg in the right lower quadrant.  Subsequent to this a 5 mm trocar and camera were then placed  intra-abdominally.  There was no injury change abdominal organs.  At this time a 5 mm trochars placed in midline infraumbilically, and one in the left lower quadrant under direct visualization.  At this time the patient was positioned.  The omentum was taken down from the inferior/greater curvature the stomach.  There is large amount of induration in the greater curvature.  Secondary to this I decided to proceed to exporter laparotomy.  A #10 blade was used to make a midline incision.  Cautery was used to maintain hemostasis and dissection was taken down the midline fascia.  Linea alba was incised.  The peritoneum was then entered bluntly.  At this time the fascia was extended to the length of the skin incision.  At this time a Balfour retractor was then placed.  The NG tube was confirmed to be within the stomach.  This was positioned lower into the stomach.  At this time a large amount of induration could be felt in the greater curvature the stomach, distal end of the stomach.  At this time he was able to incised the gastrocolic ligament.  The posterior wall of stomach appeared to be adherent to what I thought was the colonic mesentery.  Blunt dissection was used and a large pocket of purulence was found.  This was irrigated out.  The pocket of purulence was indurated and inflamed.  This tracked cephalad.  Finger fracture technique was used to break up the pocket of purulence.  There was a small pocket of purulence distal near the pylorus posteriorly.  There is minimal inflammation in this area.  The area was irrigated out.  I examined the posterior wall of the stomach and the proximal lesser curvature the stomach to see if I could visualize a perforation.  There is no perforation that could be seen.  At this time approximately 200 cc of air were then placed into the stomach.  The lesser sac was filled with saline.  There is notable was that could be seen.  At this time the area was irrigated out with multiple  liters of saline.  A 19 [place] Blake drain was then placed in the lesser sac.  Omentum was then placed into the lesser sac as well.  The Blake drain was secured to the abdominal wall using a 3-0 nylon.    At this time the fascia was then reapproximated using a #1 PDS in a standard running fashion.  Skin was left open.  The infraumbilical and left lower quadrant trocar sites were then reapproximated with skin staples.  The wound was packed with saline soaked Kerlix, dressed with ABD pad and tape.  The patient tolerated procedure well was taken to recovery in stable condition.   PLAN OF CARE: Admit to inpatient   PATIENT DISPOSITION:  PACU - guarded condition.   Delay start of Pharmacological VTE agent (>24hrs) due to surgical blood loss or risk of bleeding: yes

## 2017-11-29 NOTE — Anesthesia Procedure Notes (Signed)
Procedure Name: Intubation Date/Time: 11/28/2017 11:55 PM Performed by: Clovis Cao, CRNA Pre-anesthesia Checklist: Patient identified, Emergency Drugs available, Suction available, Patient being monitored and Timeout performed Patient Re-evaluated:Patient Re-evaluated prior to induction Oxygen Delivery Method: Circle system utilized Preoxygenation: Pre-oxygenation with 100% oxygen Induction Type: IV induction, Rapid sequence and Cricoid Pressure applied Laryngoscope Size: Miller and 2 Grade View: Grade I Tube type: Subglottic suction tube Tube size: 7.5 mm Number of attempts: 1 Airway Equipment and Method: Stylet Placement Confirmation: ETT inserted through vocal cords under direct vision,  breath sounds checked- equal and bilateral and positive ETCO2 Secured at: 23 cm Tube secured with: Tape Dental Injury: Teeth and Oropharynx as per pre-operative assessment

## 2017-11-29 NOTE — Anesthesia Postprocedure Evaluation (Signed)
Anesthesia Post Note  Patient: James Lee  Procedure(s) Performed: LAPAROSCOPY DIAGNOSTIC converted to open (N/A Abdomen) EXPLORATORY LAPAROTOMY (N/A Abdomen)     Patient location during evaluation: PACU Anesthesia Type: General Level of consciousness: sedated Pain management: pain level controlled Vital Signs Assessment: post-procedure vital signs reviewed and stable Respiratory status: spontaneous breathing and respiratory function stable Cardiovascular status: stable Postop Assessment: no apparent nausea or vomiting Anesthetic complications: no    Last Vitals:  Vitals:   11/29/17 0229 11/29/17 0230  BP: 132/74 132/74  Pulse: 86 86  Resp: 15 15  Temp: 37.1 C   SpO2: 96% 96%    Last Pain:  Vitals:   11/29/17 0423  PainSc: 7                  Rhodesia Stanger DANIEL

## 2017-11-30 ENCOUNTER — Inpatient Hospital Stay (HOSPITAL_COMMUNITY): Payer: BLUE CROSS/BLUE SHIELD

## 2017-11-30 LAB — CBC
HCT: 35.6 % — ABNORMAL LOW (ref 39.0–52.0)
HEMOGLOBIN: 11.2 g/dL — AB (ref 13.0–17.0)
MCH: 30.1 pg (ref 26.0–34.0)
MCHC: 31.5 g/dL (ref 30.0–36.0)
MCV: 95.7 fL (ref 78.0–100.0)
Platelets: 378 10*3/uL (ref 150–400)
RBC: 3.72 MIL/uL — AB (ref 4.22–5.81)
RDW: 13.2 % (ref 11.5–15.5)
WBC: 12.6 10*3/uL — ABNORMAL HIGH (ref 4.0–10.5)

## 2017-11-30 LAB — BASIC METABOLIC PANEL
ANION GAP: 9 (ref 5–15)
BUN: 16 mg/dL (ref 6–20)
CHLORIDE: 107 mmol/L (ref 101–111)
CO2: 27 mmol/L (ref 22–32)
Calcium: 8.8 mg/dL — ABNORMAL LOW (ref 8.9–10.3)
Creatinine, Ser: 1.19 mg/dL (ref 0.61–1.24)
GFR calc non Af Amer: >60 mL/min (ref >60)
Glucose, Bld: 136 mg/dL — ABNORMAL HIGH (ref 65–99)
POTASSIUM: 5.2 mmol/L — AB (ref 3.5–5.1)
SODIUM: 143 mmol/L (ref 135–145)

## 2017-11-30 LAB — GLUCOSE, CAPILLARY
GLUCOSE-CAPILLARY: 123 mg/dL — AB (ref 65–99)
GLUCOSE-CAPILLARY: 101 mg/dL — AB (ref 65–99)
GLUCOSE-CAPILLARY: 95 mg/dL (ref 65–99)
Glucose-Capillary: 114 mg/dL — ABNORMAL HIGH (ref 65–99)

## 2017-11-30 MED ORDER — HYDROMORPHONE HCL 2 MG/ML IJ SOLN
1.0000 mg | INTRAMUSCULAR | Status: DC | PRN
  Administered 2017-11-30: 2 mg via INTRAVENOUS
  Administered 2017-11-30: 1 mg via INTRAVENOUS
  Administered 2017-11-30 (×2): 2 mg via INTRAVENOUS
  Administered 2017-12-01: 1 mg via INTRAVENOUS
  Administered 2017-12-01 (×3): 2 mg via INTRAVENOUS
  Administered 2017-12-01: 1 mg via INTRAVENOUS
  Administered 2017-12-01 – 2017-12-04 (×10): 2 mg via INTRAVENOUS
  Filled 2017-11-30 (×19): qty 1

## 2017-11-30 MED ORDER — SODIUM CHLORIDE 0.9 % IV SOLN
INTRAVENOUS | Status: DC
  Administered 2017-11-30 – 2017-12-02 (×5): via INTRAVENOUS

## 2017-11-30 MED ORDER — SODIUM CHLORIDE 0.9 % IV SOLN: INTRAVENOUS | Status: DC

## 2017-11-30 NOTE — Care Management Note (Signed)
Case Management Note  Patient Details  Name: Gurnoor Ursua MRN: [id number] Date of Birth: [birth year]  Subjective/Objective:   From home with spouse, perforated gastric ulcer, pod 2 s/p ex lap with drainage of abscess and jp drain placement, NGT, for UGI tomorrow to assess for leak, has NS WD dressing changes bid.                 Action/Plan: NCM will follow for dc needs.  Expected Discharge Date:                  Expected Discharge Plan:  Home/Self Care  In-House Referral:     Discharge planning Services  CM Consult  Post Acute Care Choice:    Choice offered to:     DME Arranged:    DME Agency:     HH Arranged:    HH Agency:     Status of Service:  In process, will continue to follow  If discussed at Long Length of Stay Meetings, dates discussed:    Additional Comments:  Zenon Mayo, RN 11/30/2017, 3:16 PM

## 2017-11-30 NOTE — Progress Notes (Signed)
Patient ID: James Lee, male   DOB: 1950/07/23, 67 y.o.   MRN: [id number]    2 Days Post-Op  Subjective: Pt looks much better today than yesterday, feels better today, but sore.  Up in chair.  No flatus, but no NGT output.  Objective: Vital signs in last 24 hours: Temp:  [98.4 F (36.9 C)-99.2 F (37.3 C)] 98.4 F (36.9 C) (06/20 0748) Pulse Rate:  [73-79] 77 (06/20 0400) Resp:  [19-22] 20 (06/20 0748) BP: (120-151)/(48-89) 134/89 (06/20 0748) SpO2:  [95 %-99 %] 95 % (06/20 0748) Last BM Date: 11/28/17  Intake/Output from previous day: 06/19 0701 - 06/20 0700 In: 3185.7 [I.V.:2829; IV Piggyback:356.8] Out: 1325 [Urine:1200; Drains:125] Intake/Output this shift: No intake/output data recorded.  PE: Heart: regular Lungs: CTAB Abd: soft, appropriately tender, obese, some BS, midline wound is clean and packed.  NGT with essentially  No output.  JP with 125cc of serosang output since yesterday.  Lab Results:  Recent Labs    11/28/17 1952 11/30/17 0603  WBC 14.8* 12.6*  HGB 12.9* 11.2*  HCT 40.2 35.6*  PLT 431* 378   BMET Recent Labs    11/28/17 1952 11/30/17 0603  NA 134* 143  K 4.2 5.2*  CL 96* 107  CO2 24 27  GLUCOSE 132* 136*  BUN 27* 16  CREATININE 1.37* 1.19  CALCIUM 9.1 8.8*   PT/INR No results for input(s): LABPROT, INR in the last 72 hours. CMP     Component Value Date/Time   NA 143 11/30/2017 0603   K 5.2 (H) 11/30/2017 0603   CL 107 11/30/2017 0603   CO2 27 11/30/2017 0603   GLUCOSE 136 (H) 11/30/2017 0603   BUN 16 11/30/2017 0603   CREATININE 1.19 11/30/2017 0603   CALCIUM 8.8 (L) 11/30/2017 0603   PROT 6.7 11/28/2017 1952   ALBUMIN 3.0 (L) 11/28/2017 1952   AST 20 11/28/2017 1952   ALT 40 11/28/2017 1952   ALKPHOS 92 11/28/2017 1952   BILITOT 0.6 11/28/2017 1952   GFRNONAA >60 11/30/2017 0603   GFRAA >60 11/30/2017 0603   Lipase     Component Value Date/Time   LIPASE 22 11/28/2017 1952       Studies/Results: Dg Abd 1  View  Result Date: 11/29/2017 CLINICAL DATA:  NG tube placement EXAM: ABDOMEN - 1 VIEW COMPARISON:  None. FINDINGS: Nasogastric tube appears adequately positioned in the stomach with tip at the level of the stomach antrum/pylorus. Visualized bowel gas pattern is nonobstructive. Drainage catheter loosely coiled within the central abdomen appears stable in position. IMPRESSION: Nasogastric tube appears adequately positioned in the stomach with tip at the level of the stomach antrum/pylorus. Electronically Signed   By: Franki Cabot M.D.   On: 11/29/2017 10:24   Ct Abdomen Pelvis W Contrast  Result Date: 11/28/2017 CLINICAL DATA:  67 year old male with mid and upper abdominal pain, progressive for 3 weeks. Nausea vomiting. EXAM: CT ABDOMEN AND PELVIS WITH CONTRAST TECHNIQUE: Multidetector CT imaging of the abdomen and pelvis was performed using the standard protocol following bolus administration of intravenous contrast. CONTRAST:  175mL OMNIPAQUE IOHEXOL 300 MG/ML  SOLN COMPARISON:  Lumbar spine CT myelogram 10/23/2017. CT Abdomen and Pelvis 11/04/2010. FINDINGS: Lower chest: Stable and normal lung bases. No pericardial or pleural effusion. Hepatobiliary: Negative liver and gallbladder. Pancreas: No pancreatic inflammation or parenchymal abnormality. Spleen: Negative. Adrenals/Urinary Tract: Normal adrenal glands. Bilateral renal enhancement and contrast excretion is symmetric and normal. Decompressed ureters. Decompressed and unremarkable urinary bladder. Stomach/Bowel: Negative rectum. Negative  sigmoid colon aside from redundancy. Occasional diverticula in the descending colon, no active inflammation. Negative transverse colon. Redundant hepatic flexure. Negative right colon and appendix. Negative terminal ileum. No dilated small bowel. The gastric cardia and proximal body appear normal, but the distal gastric body is highly abnormal with what appears to be a contained gastric perforation with inflammation  along both the lesser and greater curve of the stomach. An extraluminal air and fluid collection tracks to the greater curve on series 3, image 33 and encompasses 11 x 3.7 x 4.3 centimeters. This is posterior to the true gastric lumen. Abnormal appearance of the gastric wall with perigastric inflammation continues to the antrum and duodenum bulb, and there could be a 2nd smaller contained perforation along the medial antrum on series 3, image 28. There is thickening of the duodenum bulb. The remainder of the duodenal C-loop appears more normal. No associated free intraperitoneal air or fluid. There is an enlarged peripancreatic lymph node measuring 15 millimeter short axis on series 3, image 34. Vascular/Lymphatic: Aortoiliac calcified atherosclerosis. The major arterial structures in the abdomen and pelvis are patent. Mild perigastric and gastrohepatic ligament lymphadenopathy appears to be reactive. Reproductive: Penile implant partially visible. Other: No pelvic free fluid. Musculoskeletal: Advanced lumbar spine disc and endplate degeneration at L3-L4 and L5-S1, progressed at the former since 2012. Superimposed chronic L5-S1 spondylolysis and spondylolisthesis. No acute osseous abnormality identified. IMPRESSION: 1. Highly abnormal stomach with what appears to be a large 11 cm contained gastric perforation situated posterior to the true gastric lumen primarily along the lesser curve. See series 3, image 25. Associated perigastric inflammation, but no free intraperitoneal air or fluid. Reactive regional lymph nodes. Second smaller gastric antrum ulceration or contained perforation (series 3, image 28). 2. Inflamed appearance of the duodenum bulb, but the remainder of the duodenal C-loop and the remaining bowel is negative. Normal appendix. 3. Normal lung bases. Electronically Signed   By: Genevie Ann M.D.   On: 11/28/2017 22:05   Dg Abd Portable 1v  Result Date: 11/30/2017 CLINICAL DATA:  NG tube placement EXAM:  PORTABLE ABDOMEN - 1 VIEW COMPARISON:  11/29/2017 FINDINGS: Tubing projected over the mid abdomen and arising from the right abdomen is unchanged in position since previous study. This could represent a jejunal catheter or a surgical drain. The previous esophagoenteric tube has been removed. No esophagoenteric tube is demonstrated within the field of view. Gas-filled nondistended transverse colon. IMPRESSION: Mid abdominal tubing consistent with jejunal catheter or drainage catheter unchanged in position. No esophagoenteric tube identified. Electronically Signed   By: Lucienne Capers M.D.   On: 11/30/2017 05:29   Dg Abd Portable 1v  Result Date: 11/29/2017 CLINICAL DATA:  67 y/o  M; nasogastric tube placement. EXAM: PORTABLE ABDOMEN - 1 VIEW COMPARISON:  11/28/2017 CT abdomen and pelvis. FINDINGS: Drain projecting over the left upper quadrant. Nasogastric tube tip projects over gastric body. Normal bowel gas pattern. Bones are unremarkable. IMPRESSION: Nasogastric tube tip projects over gastric body. Electronically Signed   By: Kristine Garbe M.D.   On: 11/29/2017 02:19    Anti-infectives: Anti-infectives (From admission, onward)   Start     Dose/Rate Route Frequency Ordered Stop   11/29/17 1100  anidulafungin (ERAXIS) 100 mg in sodium chloride 0.9 % 100 mL IVPB     100 mg 78 mL/hr over 100 Minutes Intravenous Every 24 hours 11/29/17 1029     11/29/17 1100  piperacillin-tazobactam (ZOSYN) IVPB 3.375 g     3.375 g 12.5 mL/hr  over 240 Minutes Intravenous Every 8 hours 11/29/17 1042     11/28/17 2315  cefoTEtan in Dextrose 5% (CEFOTAN) IVPB 2 g     2 g 100 mL/hr over 30 Minutes Intravenous STAT 11/28/17 2309 11/29/17 0005       Assessment/Plan Perforated gastric ulcer, POD 2, s/p ex lap with drainage of abscess and JP drain placement, Dr. Rosendo Gros 6-18  -cont NGT today for gastric decompression.  UGI tomorrow to assess for leak. If no leak, then likely DC NGT and trial  clears -ambulate, PT eval given use of assistive device at home to mobilize secondary to back pain. -NS WD dressing changes BID -pulm toilet, getting to 1750 on IS -on pepcid -check h pylori, but suspect given pre-op NSAID use this was secondary to NSAIDs. -will do upper endoscopy as outpatient to rule out malignancy -DC telemetry -apply abdominal binder for support with mobilization -TX to 6N  Hyperkalemia DC k in fluid, check BMET in am  DM -on SSI HTN -prns ordered IV for now given NPO status OSA Recent neck surgery  FEN - NPO, NGT, IVFs VTE - SCDs/Lovenox ID - Zosyn 6/18 -->, Eraxis 6/19 -->   LOS: 1 day    Henreitta Cea , [facility] 11/30/2017, 9:18 AM Pager: [phone]

## 2017-11-30 NOTE — Evaluation (Signed)
Physical Therapy Evaluation Patient Details Name: James Lee MRN: [id number] DOB: [birth year] Today's Date: 11/30/2017   History of Present Illness  67 y.o. male admitted on 11/28/17 for abdominal pain.  CT scan revealed bowel peforation and pt underwent exploratory laparotomy with drainage of abcess and JP drain placement.  Post op pt has NGT and is NPO.  Pt with recent cervical spine fusion surgery with Dr. Ronnald Ramp (11/10/17), and other significant PMH of R knee meniscus repair, lower back surgery, HTN, and DM.  Clinical Impression  Pt with significant abdominal pain, but understands the importance of walking for recovery of his bowel function and to help get him home.  He was able to do two laps around the circle with RW and min guard assist.  He will have limited days his wife can be home with him, so I would like to have home therapy come see him if he qualifies to get him back to his baseline faster as she will have to return to work ASAP and he remains unsteady and painful, having to use a walker instead of a cane at this time.  PT to follow acutely until d/c confirmed.       Follow Up Recommendations Home health PT;Supervision for mobility/OOB    Equipment Recommendations  None recommended by PT    Recommendations for Other Services   NA     Precautions / Restrictions Precautions Precautions: Cervical;Other (comment) Precaution Comments: JP drain R side, NGT to low intermittent suction, and oxygen      Mobility  Bed Mobility               General bed mobility comments: Pt was OOB in the recliner chair.   Transfers Overall transfer level: Needs assistance Equipment used: Rolling walker (2 wheeled) Transfers: Sit to/from Stand Sit to Stand: Supervision         General transfer comment: supervision for safety due to slow transitions and heavy reliance on hands to assist.   Ambulation/Gait Ambulation/Gait assistance: Min guard Gait Distance (Feet): 300 Feet Assistive  device: Rolling walker (2 wheeled) Gait Pattern/deviations: Step-through pattern;Trunk flexed     General Gait Details: Verbal cues for upright posture, closer proximity to RW.           Balance Overall balance assessment: Needs assistance Sitting-balance support: Feet supported;No upper extremity supported Sitting balance-Leahy Scale: Good     Standing balance support: Bilateral upper extremity supported;Single extremity supported Standing balance-Leahy Scale: Poor Standing balance comment: relies on at least one hand supported in standing for stability.                              Pertinent Vitals/Pain Pain Assessment: 0-10 Pain Score: 6  Pain Location: abdomen and neck Pain Descriptors / Indicators: Cramping;Aching Pain Intervention(s): Limited activity within patient's tolerance;Monitored during session;Repositioned    Home Living Family/patient expects to be discharged to:: Private residence Living Arrangements: Spouse/significant other Available Help at Discharge: Family(wife works, but could possibly take a day or two off if need) Type of Home: House Home Access: Level entry     Home Layout: One level Home Equipment: Environmental consultant - 2 wheels;Cane - single point;Shower seat - built in      Prior Function Level of Independence: Independent with assistive device(s)         Comments: Pt was using cane after his neck surgery, he is on disability and does not work currently.  Hand Dominance   Dominant Hand: Right    Extremity/Trunk Assessment   Upper Extremity Assessment Upper Extremity Assessment: Generalized weakness    Lower Extremity Assessment Lower Extremity Assessment: Generalized weakness    Cervical / Trunk Assessment Cervical / Trunk Assessment: Other exceptions Cervical / Trunk Exceptions: pt with h/o recent cervical surgery and multiple Low back surgery and reports he is going to have another low back surgery in August of this  year.   Communication   Communication: No difficulties  Cognition Arousal/Alertness: Awake/alert Behavior During Therapy: WFL for tasks assessed/performed Overall Cognitive Status: Within Functional Limits for tasks assessed                                               Assessment/Plan    PT Assessment Patient needs continued PT services  PT Problem List Decreased strength;Decreased activity tolerance;Decreased balance;Decreased mobility;Decreased knowledge of use of DME;Decreased knowledge of precautions;Obesity;Pain       PT Treatment Interventions DME instruction;Gait training;Functional mobility training;Therapeutic activities;Therapeutic exercise;Balance training;Patient/family education    PT Goals (Current goals can be found in the Care Plan section)  Acute Rehab PT Goals Patient Stated Goal: to stay out of the hospital PT Goal Formulation: With patient Time For Goal Achievement: 12/14/17 Potential to Achieve Goals: Good    Frequency Min 3X/week           AM-PAC PT "6 Clicks" Daily Activity  Outcome Measure Difficulty turning over in bed (including adjusting bedclothes, sheets and blankets)?: Unable Difficulty moving from lying on back to sitting on the side of the bed? : Unable Difficulty sitting down on and standing up from a chair with arms (e.g., wheelchair, bedside commode, etc,.)?: None Help needed moving to and from a bed to chair (including a wheelchair)?: A Little Help needed walking in hospital room?: A Little Help needed climbing 3-5 steps with a railing? : A Little 6 Click Score: 15    End of Session Equipment Utilized During Treatment: Oxygen Activity Tolerance: Patient limited by pain Patient left: in chair;with call bell/phone within reach Nurse Communication: Mobility status PT Visit Diagnosis: Difficulty in walking, not elsewhere classified (R26.2);Muscle weakness (generalized) (M62.81);Pain Pain - Right/Left: (abdomen) Pain  - part of body: (abdomen)    Time: 1130-1200 PT Time Calculation (min) (ACUTE ONLY): 30 min   Charges:          Wells Guiles B. Enis Riecke, PT, DPT [phone]   PT Evaluation $PT Eval Moderate Complexity: 1 Mod PT Treatments $Gait Training: 8-22 mins   11/30/2017, 12:13 PM

## 2017-12-01 ENCOUNTER — Inpatient Hospital Stay (HOSPITAL_COMMUNITY): Payer: BLUE CROSS/BLUE SHIELD

## 2017-12-01 LAB — BASIC METABOLIC PANEL
Anion gap: 10 (ref 5–15)
BUN: 14 mg/dL (ref 6–20)
CALCIUM: 9.1 mg/dL (ref 8.9–10.3)
CO2: 23 mmol/L (ref 22–32)
Chloride: 109 mmol/L (ref 101–111)
Creatinine, Ser: 1.21 mg/dL (ref 0.61–1.24)
GFR calc Af Amer: >60 mL/min (ref >60)
GFR calc non Af Amer: >60 mL/min (ref >60)
GLUCOSE: 121 mg/dL — AB (ref 65–99)
POTASSIUM: 4.2 mmol/L (ref 3.5–5.1)
SODIUM: 142 mmol/L (ref 135–145)

## 2017-12-01 LAB — CBC
HCT: 37 % — ABNORMAL LOW (ref 39.0–52.0)
Hemoglobin: 11.6 g/dL — ABNORMAL LOW (ref 13.0–17.0)
MCH: 29.4 pg (ref 26.0–34.0)
MCHC: 31.4 g/dL (ref 30.0–36.0)
MCV: 93.7 fL (ref 78.0–100.0)
PLATELETS: 475 10*3/uL — AB (ref 150–400)
RBC: 3.95 MIL/uL — AB (ref 4.22–5.81)
RDW: 13.1 % (ref 11.5–15.5)
WBC: 13.4 10*3/uL — ABNORMAL HIGH (ref 4.0–10.5)

## 2017-12-01 LAB — GLUCOSE, CAPILLARY
GLUCOSE-CAPILLARY: 118 mg/dL — AB (ref 65–99)
Glucose-Capillary: 169 mg/dL — ABNORMAL HIGH (ref 65–99)
Glucose-Capillary: 111 mg/dL — ABNORMAL HIGH (ref 65–99)
Glucose-Capillary: 119 mg/dL — ABNORMAL HIGH (ref 65–99)

## 2017-12-01 LAB — H PYLORI, IGM, IGG, IGA AB: H PYLORI IGG: 4.52 {index_val} — AB (ref 0.00–0.79)

## 2017-12-01 MED ORDER — WHITE PETROLATUM EX OINT
TOPICAL_OINTMENT | CUTANEOUS | Status: AC
  Filled 2017-12-01: qty 28.35

## 2017-12-01 MED ORDER — IOHEXOL 300 MG/ML  SOLN
150.0000 mL | Freq: Once | INTRAMUSCULAR | Status: AC | PRN
  Administered 2017-12-01: 200 mL via ORAL

## 2017-12-01 NOTE — Progress Notes (Signed)
Physical Therapy Treatment Patient Details Name: James Lee MRN: [id number] DOB: [birth year] Today's Date: 12/01/2017    History of Present Illness 67 y.o. male admitted on 11/28/17 for abdominal pain.  CT scan revealed bowel peforation and pt underwent exploratory laparotomy with drainage of abcess and JP drain placement.  Post op pt has NGT and is NPO.  Pt with recent cervical spine fusion surgery with Dr. Ronnald Ramp (11/10/17), and other significant PMH of R knee meniscus repair, lower back surgery, HTN, and DM.    PT Comments    Pt with significant abdominal pain, but willing to push though pain to participate with therapy. Abdominal binder present in room, however too large to provide any support for pt. Spoke with RN about ordering a smaller size. Pt with very pain full gait requiring standing rest break secondary to pain. Supervision for safety during gait. Pt would benefit from continued skilled PT to return to PLOF and increase functional independence.     Follow Up Recommendations  Home health PT;Supervision for mobility/OOB     Equipment Recommendations  None recommended by PT    Recommendations for Other Services       Precautions / Restrictions Precautions Precautions: Cervical;Other (comment) Precaution Booklet Issued: No Precaution Comments: JP drain R side Restrictions Weight Bearing Restrictions: No    Mobility  Bed Mobility Overal bed mobility: Modified Independent             General bed mobility comments: Pt requires increased time secondary to pain. Educated on using pillow to brace abdomen for comfort with bed mobilities.  Transfers Overall transfer level: Needs assistance Equipment used: None Transfers: Sit to/from Stand Sit to Stand: Supervision         General transfer comment: supervision for safety due to slow transitions secondary to pain.  Ambulation/Gait Ambulation/Gait assistance: Supervision Gait Distance (Feet): 1000 Feet Assistive  device: IV Pole Gait Pattern/deviations: Step-through pattern;Trunk flexed Gait velocity: decreased Gait velocity interpretation: 1.31 - 2.62 ft/sec, indicative of limited community ambulator General Gait Details: Verbal cues for upright posture.    Stairs             Wheelchair Mobility    Modified Rankin (Stroke Patients Only)       Balance Overall balance assessment: Needs assistance Sitting-balance support: Feet supported;No upper extremity supported Sitting balance-Leahy Scale: Good     Standing balance support: Bilateral upper extremity supported;Single extremity supported Standing balance-Leahy Scale: Fair Standing balance comment: can statically stand w/o assist. Cannot tolerate challange.                            Cognition Arousal/Alertness: Awake/alert Behavior During Therapy: WFL for tasks assessed/performed Overall Cognitive Status: Within Functional Limits for tasks assessed                                        Exercises      General Comments General comments (skin integrity, edema, etc.): Abdominal binder present, however too large for pt to use.      Pertinent Vitals/Pain Pain Assessment: Faces Faces Pain Scale: Hurts even more Pain Location: abdomen and neck Pain Descriptors / Indicators: Cramping;Aching Pain Intervention(s): Monitored during session;Limited activity within patient's tolerance;Repositioned    Home Living                      Prior  Function            PT Goals (current goals can now be found in the care plan section) Acute Rehab PT Goals Patient Stated Goal: to stay out of the hospital PT Goal Formulation: With patient Time For Goal Achievement: 12/14/17 Potential to Achieve Goals: Good Progress towards PT goals: Progressing toward goals    Frequency    Min 3X/week      PT Plan Current plan remains appropriate    Co-evaluation              AM-PAC PT "6 Clicks"  Daily Activity  Outcome Measure  Difficulty turning over in bed (including adjusting bedclothes, sheets and blankets)?: None Difficulty moving from lying on back to sitting on the side of the bed? : None Difficulty sitting down on and standing up from a chair with arms (e.g., wheelchair, bedside commode, etc,.)?: None Help needed moving to and from a bed to chair (including a wheelchair)?: A Little Help needed walking in hospital room?: A Little Help needed climbing 3-5 steps with a railing? : A Little 6 Click Score: 21    End of Session   Activity Tolerance: Patient tolerated treatment well Patient left: in chair;with call bell/phone within reach Nurse Communication: Mobility status;Other (comment)(need for smaller abd binder) PT Visit Diagnosis: Difficulty in walking, not elsewhere classified (R26.2);Muscle weakness (generalized) (M62.81);Pain Pain - Right/Left: (abdomen) Pain - part of body: (abdomen)     Time: 1117-3567 PT Time Calculation (min) (ACUTE ONLY): 28 min  Charges:  $Gait Training: 23-37 mins                    G Codes:       Benjiman Core, [place] Pager [phone] Acute Rehab   Allena Katz 12/01/2017, 3:42 PM

## 2017-12-01 NOTE — Progress Notes (Signed)
Patient ID: Stafford Riviera, male   DOB: March 13, 1951, 67 y.o.   MRN: [id number]    3 Days Post-Op  Subjective: Pt doesn't feel well today.  Says he hasn't slept in day as he does not have his CPAP.  Got a little nauseated with gastrograffin for UGI.  No flatus yet.  Objective: Vital signs in last 24 hours: Temp:  [98.6 F (37 C)-99.9 F (37.7 C)] 98.7 F (37.1 C) (06/21 0459) Pulse Rate:  [77-89] 89 (06/21 0459) Resp:  [17-18] 18 (06/21 0459) BP: (159-165)/(76-82) 165/80 (06/21 0459) SpO2:  [94 %-100 %] 94 % (06/21 0459) Last BM Date: 11/28/17  Intake/Output from previous day: 06/20 0701 - 06/21 0700 In: 179.7 [IV Piggyback:179.7] Out: 1855 [Urine:1000; Emesis/NG output:800; Drains:55] Intake/Output this shift: No intake/output data recorded.  PE: Heart: regular Lungs: CTAB Abd: soft, appropriately tender, midline wound is clean and packed.  +BS, JP drain with serosang output 55cc yesterday  Lab Results:  Recent Labs    11/30/17 0603 12/01/17 0718  WBC 12.6* 13.4*  HGB 11.2* 11.6*  HCT 35.6* 37.0*  PLT 378 475*   BMET Recent Labs    11/30/17 0603 12/01/17 0718  NA 143 142  K 5.2* 4.2  CL 107 109  CO2 27 23  GLUCOSE 136* 121*  BUN 16 14  CREATININE 1.19 1.21  CALCIUM 8.8* 9.1   PT/INR No results for input(s): LABPROT, INR in the last 72 hours. CMP     Component Value Date/Time   NA 142 12/01/2017 0718   K 4.2 12/01/2017 0718   CL 109 12/01/2017 0718   CO2 23 12/01/2017 0718   GLUCOSE 121 (H) 12/01/2017 0718   BUN 14 12/01/2017 0718   CREATININE 1.21 12/01/2017 0718   CALCIUM 9.1 12/01/2017 0718   PROT 6.7 11/28/2017 1952   ALBUMIN 3.0 (L) 11/28/2017 1952   AST 20 11/28/2017 1952   ALT 40 11/28/2017 1952   ALKPHOS 92 11/28/2017 1952   BILITOT 0.6 11/28/2017 1952   GFRNONAA >60 12/01/2017 0718   GFRAA >60 12/01/2017 0718   Lipase     Component Value Date/Time   LIPASE 22 11/28/2017 1952       Studies/Results: Dg Abd 1 View  Result Date:  11/29/2017 CLINICAL DATA:  NG tube placement EXAM: ABDOMEN - 1 VIEW COMPARISON:  None. FINDINGS: Nasogastric tube appears adequately positioned in the stomach with tip at the level of the stomach antrum/pylorus. Visualized bowel gas pattern is nonobstructive. Drainage catheter loosely coiled within the central abdomen appears stable in position. IMPRESSION: Nasogastric tube appears adequately positioned in the stomach with tip at the level of the stomach antrum/pylorus. Electronically Signed   By: Franki Cabot M.D.   On: 11/29/2017 10:24   Dg Abd Portable 1v  Result Date: 11/30/2017 CLINICAL DATA:  Nasogastric tube placement. EXAM: PORTABLE ABDOMEN - 1 VIEW COMPARISON:  Radiograph of same day. FINDINGS: The bowel gas pattern is normal. No radio-opaque calculi or other significant radiographic abnormality are seen. Nasogastric tube appears to be coiled within the proximal stomach. IMPRESSION: Nasogastric tube seen coiled within the proximal stomach. No evidence of bowel obstruction or ileus. Electronically Signed   By: Marijo Conception, M.D.   On: 11/30/2017 12:53   Dg Abd Portable 1v  Result Date: 11/30/2017 CLINICAL DATA:  Nasogastric tube placement EXAM: PORTABLE ABDOMEN - 1 VIEW COMPARISON:  Study obtained earlier in the day FINDINGS: Nasogastric tube tip is in the proximal stomach with the side port in the distal  esophagus. There is no appreciable bowel dilatation or air-fluid level to suggest bowel obstruction. No free air. Apparent drainage catheter tip is just to the left of midline in the mid abdomen. Visualized lung bases are clear. IMPRESSION: Nasogastric tube tip in proximal stomach with side port above the gastroesophageal junction. Advise advancing nasogastric tube approximately 6-8 cm to insure that tube tip and side port are both well within the stomach. Bowel gas pattern normal.  Drainage catheter position unchanged. These results will be called to the ordering clinician or representative by  the Radiologist Assistant, and communication documented in the PACS or zVision Dashboard. Electronically Signed   By: Lowella Grip III M.D.   On: 11/30/2017 09:38   Dg Abd Portable 1v  Result Date: 11/30/2017 CLINICAL DATA:  NG tube placement EXAM: PORTABLE ABDOMEN - 1 VIEW COMPARISON:  11/29/2017 FINDINGS: Tubing projected over the mid abdomen and arising from the right abdomen is unchanged in position since previous study. This could represent a jejunal catheter or a surgical drain. The previous esophagoenteric tube has been removed. No esophagoenteric tube is demonstrated within the field of view. Gas-filled nondistended transverse colon. IMPRESSION: Mid abdominal tubing consistent with jejunal catheter or drainage catheter unchanged in position. No esophagoenteric tube identified. Electronically Signed   By: Lucienne Capers M.D.   On: 11/30/2017 05:29   Dg Duanne Limerick W/water Sol Cm  Result Date: 12/01/2017 CLINICAL DATA:  Postop from repair of perforated gastric ulcers. Evaluate for postop leak and gastric outlet obstruction. EXAM: WATER SOLUBLE UPPER GI SERIES TECHNIQUE: Single-column upper GI series was performed using Omnipaque 300 water soluble contrast, which was injected through the existing nasogastric tube. COMPARISON:  None. FLUOROSCOPY TIME:  Fluoroscopy Time:  1 minutes 12 seconds Radiation Exposure Index (if provided by the fluoroscopic device): 32.4 mGy Number of Acquired Spot Images: 0 FINDINGS: The scout film shows a nonobstructive bowel gas pattern. A surgical drain in seen in place. A nasogastric tube is seen with tip overlying the proximal stomach. Upper GI series was performed by injecting water-soluble contrast through the existing nasogastric tube. This shows narrowing and irregular contour of the distal gastric body and antrum, likely due to postop edema. There is no evidence of contrast leak or extravasation from the stomach. Prompt gastric emptying is seen, and the duodenal sweep  is normal in appearance. IMPRESSION: Narrowing and irregular contour of the distal gastric body and antrum, most likely due to postop edema. No evidence of contrast leak or gastric outlet obstruction. Electronically Signed   By: Earle Gell M.D.   On: 12/01/2017 08:32    Anti-infectives: Anti-infectives (From admission, onward)   Start     Dose/Rate Route Frequency Ordered Stop   11/29/17 1100  anidulafungin (ERAXIS) 100 mg in sodium chloride 0.9 % 100 mL IVPB     100 mg 78 mL/hr over 100 Minutes Intravenous Every 24 hours 11/29/17 1029     11/29/17 1100  piperacillin-tazobactam (ZOSYN) IVPB 3.375 g     3.375 g 12.5 mL/hr over 240 Minutes Intravenous Every 8 hours 11/29/17 1042     11/28/17 2315  cefoTEtan in Dextrose 5% (CEFOTAN) IVPB 2 g     2 g 100 mL/hr over 30 Minutes Intravenous STAT 11/28/17 2309 11/29/17 0005       Assessment/Plan Perforated gastric ulcer, POD 2, s/p ex lap with drainage of abscess and JP drain placement, Dr. Rosendo Gros 6-18  -DC NGT today as UGI negative for leak.  It does show some narrowing of the  gastric body and antrum, possibly secondary to edema.  Will still need endo as outpatient to rule out mass as well. -try some clear liquids -ambulate, PT recommends some HHPT currently.  Will see how he progresses -NS WD dressing changes BID -pulm toilet, getting to 1750 on IS -on pepcid -WBC up to 13K, will follow -check h pylori, but suspect given pre-op NSAID use this was secondary to NSAIDs. -will do upper endoscopy as outpatient to rule out malignancy -DC telemetry -apply abdominal binder for support with mobilization   Hyperkalemia improved  DM -on SSI HTN -prns ordered IV for now given NPO status OSA Recent neck surgery  FEN -clear liquids VTE -SCDs/Lovenox ID -Zosyn 6/18 -->, Eraxis 6/19 -->   LOS: 2 days    Henreitta Cea , [facility] 12/01/2017, 10:05 AM Pager: [phone]

## 2017-12-01 NOTE — Progress Notes (Signed)
RN called to request cpap for pt.  Pt uses CPAP at home and would like cpap for qhs and a nap. Pt states he uses cpap at 13 cmH2O.  Pt placed on home settings, mask adjusted to his comfort.  Pt states he feels like he's comfortable with cpap pressure currently. VSS

## 2017-12-02 LAB — GLUCOSE, CAPILLARY
GLUCOSE-CAPILLARY: 139 mg/dL — AB (ref 65–99)
Glucose-Capillary: 90 mg/dL (ref 65–99)
Glucose-Capillary: 99 mg/dL (ref 65–99)
Glucose-Capillary: 89 mg/dL (ref 65–99)

## 2017-12-02 LAB — CBC
HEMATOCRIT: 29.5 % — AB (ref 39.0–52.0)
Hemoglobin: 9.3 g/dL — ABNORMAL LOW (ref 13.0–17.0)
MCH: 29.8 pg (ref 26.0–34.0)
MCHC: 31.5 g/dL (ref 30.0–36.0)
MCV: 94.6 fL (ref 78.0–100.0)
Platelets: 380 10*3/uL (ref 150–400)
RBC: 3.12 MIL/uL — ABNORMAL LOW (ref 4.22–5.81)
RDW: 13.2 % (ref 11.5–15.5)
WBC: 9.1 10*3/uL (ref 4.0–10.5)

## 2017-12-02 MED ORDER — SODIUM CHLORIDE 0.9 % IV SOLN
INTRAVENOUS | Status: DC
  Administered 2017-12-02 (×2): via INTRAVENOUS

## 2017-12-02 NOTE — Progress Notes (Signed)
James Lee:  [phone] General Surgery Progress Note   LOS: 3 days  POD -  4 Days Post-Op  Chief Complaint: Abdominal pain  Assessment and Plan: 1.  LAPAROSCOPY DIAGNOSTIC converted to open EXPLORATORY LAPAROTOMY - 11/29/2017 - [name] - 6/19 >>> James Lee - 6/18 >>>   James Lee q12hours  Going slow - will leave on clr liquids for now  2.  DM  BS - 139 - 12/02/2017 3.  HTN 4.  OSA 5.  C spine surgery - D. Ronnald Ramp - 11/10/2017 6.  Anemia - Hgb - 9.3 - 12/02/2017 7.  DVT prophylaxis - Lovenox   Active Problems:   Perforated bowel (HCC)   Gastric ulcer  Subjective:  Minimal flatus, no BM.  Modest po intake.  Still tender.  Objective:   Vitals:   12/01/17 2107 12/02/17 0514  BP: (!) 148/88 (!) 162/85  Pulse: 61 69  Resp: 20 18  Temp: 98.2 F (36.8 C) 97.8 F (36.6 C)  SpO2: 96% 97%     Intake/Output from previous day:  06/21 0701 - 06/22 0700 In: 5056.8 [P.O.:480; I.V.:4025.2; IV Piggyback:551.6] Out: 158 [Emesis/NG output:125; Drains:33]  Intake/Output this shift:  No intake/output data recorded.   Physical Exam:   General: WM who is alert and oriented.    HEENT: Normal. Pupils equal. .   Lungs: Clear   Abdomen: Distended.  Few BS.  Tender.   Wound: Okay   Lab Results:    Recent Labs    12/01/17 0718 12/02/17 0444  WBC 13.4* 9.1  HGB 11.6* 9.3*  HCT 37.0* 29.5*  PLT 475* 380    BMET   Recent Labs    11/30/17 0603 12/01/17 0718  NA 143 142  K 5.2* 4.2  CL 107 109  CO2 27 23  GLUCOSE 136* 121*  BUN 16 14  CREATININE 1.19 1.21  CALCIUM 8.8* 9.1    PT/INR  No results for input(s): LABPROT, INR in the last 72 hours.  ABG  No results for input(s): PHART, HCO3 in the last 72 hours.  Invalid input(s): PCO2, PO2   Studies/Results:  Dg Abd Portable 1v  Result Date: 11/30/2017 CLINICAL DATA:  Nasogastric tube placement. EXAM: PORTABLE ABDOMEN - 1 VIEW COMPARISON:  Radiograph of same day. FINDINGS: The bowel gas  pattern is normal. No radio-opaque calculi or other significant radiographic abnormality are seen. Nasogastric tube appears to be coiled within the proximal stomach. IMPRESSION: Nasogastric tube seen coiled within the proximal stomach. No evidence of bowel obstruction or ileus. Electronically Signed   By: Marijo Conception, M.D.   On: 11/30/2017 12:53   Dg Abd Portable 1v  Result Date: 11/30/2017 CLINICAL DATA:  Nasogastric tube placement EXAM: PORTABLE ABDOMEN - 1 VIEW COMPARISON:  Study obtained earlier in the day FINDINGS: Nasogastric tube tip is in the proximal stomach with the side port in the distal esophagus. There is no appreciable bowel dilatation or air-fluid level to suggest bowel obstruction. No free air. Apparent drainage catheter tip is just to the left of midline in the mid abdomen. Visualized lung bases are clear. IMPRESSION: Nasogastric tube tip in proximal stomach with side port above the gastroesophageal junction. Advise advancing nasogastric tube approximately 6-8 cm to insure that tube tip and side port are both well within the stomach. Bowel gas pattern normal.  Drainage catheter position unchanged. These results will be called to the ordering clinician or representative by the Radiologist Assistant, and communication documented in the PACS or zVision  Dashboard. Electronically Signed   By: Lowella Grip III M.D.   On: 11/30/2017 09:38   Dg Duanne Limerick W/water Sol Cm  Result Date: 12/01/2017 CLINICAL DATA:  Postop from repair of perforated gastric ulcers. Evaluate for postop leak and gastric outlet obstruction. EXAM: WATER SOLUBLE UPPER GI SERIES TECHNIQUE: Single-column upper GI series was performed using Omnipaque 300 water soluble contrast, which was injected through the existing nasogastric tube. COMPARISON:  None. FLUOROSCOPY TIME:  Fluoroscopy Time:  1 minutes 12 seconds Radiation Exposure Index (if provided by the fluoroscopic device): 32.4 mGy Number of Acquired Spot Images: 0  FINDINGS: The scout film shows a nonobstructive bowel gas pattern. A surgical drain in seen in place. A nasogastric tube is seen with tip overlying the proximal stomach. Upper GI series was performed by injecting water-soluble contrast through the existing nasogastric tube. This shows narrowing and irregular contour of the distal gastric body and antrum, likely due to postop edema. There is no evidence of contrast leak or extravasation from the stomach. Prompt gastric emptying is seen, and the duodenal sweep is normal in appearance. IMPRESSION: Narrowing and irregular contour of the distal gastric body and antrum, most likely due to postop edema. No evidence of contrast leak or gastric outlet obstruction. Electronically Signed   By: Earle Gell M.D.   On: 12/01/2017 08:32     Anti-infectives:   Anti-infectives (From admission, onward)   Start     Dose/Rate Route Frequency Ordered Stop   11/29/17 1100  anidulafungin (ERAXIS) 100 mg in sodium chloride 0.9 % 100 mL IVPB     100 mg 78 mL/hr over 100 Minutes Intravenous Every 24 hours 11/29/17 1029     11/29/17 1100  piperacillin-tazobactam (ZOSYN) IVPB 3.375 g     3.375 g 12.5 mL/hr over 240 Minutes Intravenous Every 8 hours 11/29/17 1042     11/28/17 2315  cefoTEtan in Dextrose 5% (CEFOTAN) IVPB 2 g     2 g 100 mL/hr over 30 Minutes Intravenous STAT 11/28/17 2309 11/29/17 0005      Alphonsa Overall, MD, FACS Pager: [facility]: [phone] 12/02/2017

## 2017-12-02 NOTE — Progress Notes (Signed)
Patient ambulated around the entire unit 3 times independently.  He tolerated the walk very well.

## 2017-12-03 LAB — GLUCOSE, CAPILLARY
GLUCOSE-CAPILLARY: 76 mg/dL (ref 65–99)
Glucose-Capillary: 97 mg/dL (ref 65–99)
Glucose-Capillary: 129 mg/dL — ABNORMAL HIGH (ref 65–99)
Glucose-Capillary: 148 mg/dL — ABNORMAL HIGH (ref 65–99)

## 2017-12-03 LAB — CBC WITH DIFFERENTIAL/PLATELET
Abs Immature Granulocytes: 0.1 10*3/uL (ref 0.0–0.1)
BASOS PCT: 1 %
Basophils Absolute: 0.1 10*3/uL (ref 0.0–0.1)
EOS ABS: 0.5 10*3/uL (ref 0.0–0.7)
Eosinophils Relative: 5 %
HCT: 33.3 % — ABNORMAL LOW (ref 39.0–52.0)
Hemoglobin: 10.5 g/dL — ABNORMAL LOW (ref 13.0–17.0)
IMMATURE GRANULOCYTES: 1 %
Lymphocytes Relative: 20 %
Lymphs Abs: 1.9 10*3/uL (ref 0.7–4.0)
MCH: 29.6 pg (ref 26.0–34.0)
MCHC: 31.5 g/dL (ref 30.0–36.0)
MCV: 93.8 fL (ref 78.0–100.0)
Monocytes Absolute: 0.9 10*3/uL (ref 0.1–1.0)
Monocytes Relative: 9 %
NEUTROS ABS: 6.2 10*3/uL (ref 1.7–7.7)
NEUTROS PCT: 64 %
PLATELETS: 467 10*3/uL — AB (ref 150–400)
RBC: 3.55 MIL/uL — AB (ref 4.22–5.81)
RDW: 13.1 % (ref 11.5–15.5)
WBC: 9.6 10*3/uL (ref 4.0–10.5)

## 2017-12-03 MED ORDER — PANTOPRAZOLE SODIUM 40 MG PO TBEC
40.0000 mg | DELAYED_RELEASE_TABLET | Freq: Every day | ORAL | Status: DC
  Administered 2017-12-03 – 2017-12-04 (×2): 40 mg via ORAL
  Filled 2017-12-03 (×2): qty 1

## 2017-12-03 MED ORDER — OXYCODONE HCL 5 MG PO TABS
5.0000 mg | ORAL_TABLET | ORAL | Status: DC | PRN
  Administered 2017-12-03: 10 mg via ORAL
  Filled 2017-12-03: qty 2

## 2017-12-03 NOTE — Progress Notes (Signed)
Patient ambulated again this morning around the entire unit 3 times.

## 2017-12-03 NOTE — Progress Notes (Signed)
Patient ID: James Lee, male   DOB: 1950/10/21, 67 y.o.   MRN: [id number]    5 Days Post-Op  Subjective: Had several BM's yesterday.  Feeling better  Objective: Vital signs in last 24 hours: Temp:  [97.6 F (36.4 C)-98.5 F (36.9 C)] 97.6 F (36.4 C) (06/23 0521) Pulse Rate:  [54-69] 54 (06/23 0521) Resp:  [17-18] 17 (06/23 0521) BP: (158-171)/(82-89) 158/82 (06/23 0521) SpO2:  [97 %-98 %] 98 % (06/23 0521) Weight:  [117.4 kg (258 lb 14.4 oz)] 117.4 kg (258 lb 14.4 oz) (06/22 2152) Last BM Date: 12/02/17  Intake/Output from previous day: 06/22 0701 - 06/23 0700 In: 2207.6 [P.O.:900; I.V.:1189.1; IV Piggyback:98.5] Out: 10 [Drains:10] Intake/Output this shift: No intake/output data recorded.  PE: Heart: regular Abd: soft, appropriately tender, midline wound is clean and packed.   JP drain with serosang output  Lab Results:  Recent Labs    12/02/17 0444 12/03/17 0530  WBC 9.1 9.6  HGB 9.3* 10.5*  HCT 29.5* 33.3*  PLT 380 467*   BMET Recent Labs    12/01/17 0718  NA 142  K 4.2  CL 109  CO2 23  GLUCOSE 121*  BUN 14  CREATININE 1.21  CALCIUM 9.1   PT/INR No results for input(s): LABPROT, INR in the last 72 hours. CMP     Component Value Date/Time   NA 142 12/01/2017 0718   K 4.2 12/01/2017 0718   CL 109 12/01/2017 0718   CO2 23 12/01/2017 0718   GLUCOSE 121 (H) 12/01/2017 0718   BUN 14 12/01/2017 0718   CREATININE 1.21 12/01/2017 0718   CALCIUM 9.1 12/01/2017 0718   PROT 6.7 11/28/2017 1952   ALBUMIN 3.0 (L) 11/28/2017 1952   AST 20 11/28/2017 1952   ALT 40 11/28/2017 1952   ALKPHOS 92 11/28/2017 1952   BILITOT 0.6 11/28/2017 1952   GFRNONAA >60 12/01/2017 0718   GFRAA >60 12/01/2017 0718   Lipase     Component Value Date/Time   LIPASE 22 11/28/2017 1952       Studies/Results: No results found.  Anti-infectives: Anti-infectives (From admission, onward)   Start     Dose/Rate Route Frequency Ordered Stop   11/29/17 1100   anidulafungin (ERAXIS) 100 mg in sodium chloride 0.9 % 100 mL IVPB     100 mg 78 mL/hr over 100 Minutes Intravenous Every 24 hours 11/29/17 1029     11/29/17 1100  piperacillin-tazobactam (ZOSYN) IVPB 3.375 g     3.375 g 12.5 mL/hr over 240 Minutes Intravenous Every 8 hours 11/29/17 1042     11/28/17 2315  cefoTEtan in Dextrose 5% (CEFOTAN) IVPB 2 g     2 g 100 mL/hr over 30 Minutes Intravenous STAT 11/28/17 2309 11/29/17 0005       Assessment/Plan Perforated gastric ulcer, POD 2, s/p ex lap with drainage of abscess and JP drain placement, Dr. Rosendo Gros 6-18  - UGI negative for leak.  It does show some narrowing of the gastric body and antrum, possibly secondary to edema.  Will need endo as outpatient to rule out mass as well. -advance to soft foods -ambulate, PT recommends some HHPT currently.  Will see how he progresses -NS WD dressing changes BID, wound vac ordered -pulm toilet -PO PPI started -WBC WNL, On Eraxis and Zosyn -check h pylori, but suspect given pre-op NSAID use this was secondary to NSAIDs. -DC telemetry -apply abdominal binder for support with mobilization   Hyperkalemia improved  DM -on SSI HTN -prns ordered  OSA Recent neck surgery  FEN -soft diet VTE -SCDs/Lovenox ID -Zosyn 6/18 -->, Eraxis 6/19 -->   LOS: 4 days   Rosario Adie, MD  [facility] [facility]

## 2017-12-04 LAB — GLUCOSE, CAPILLARY
GLUCOSE-CAPILLARY: 120 mg/dL — AB (ref 65–99)
GLUCOSE-CAPILLARY: 127 mg/dL — AB (ref 65–99)

## 2017-12-04 MED ORDER — PANTOPRAZOLE SODIUM 40 MG PO TBEC: 40.0000 mg | DELAYED_RELEASE_TABLET | Freq: Every day | ORAL | 1 refills | Status: DC

## 2017-12-04 MED ORDER — OXYCODONE HCL 5 MG PO TABS: 5.0000 mg | ORAL_TABLET | Freq: Four times a day (QID) | ORAL | 0 refills | Status: DC | PRN

## 2017-12-04 NOTE — Consult Note (Signed)
[place] Nurse wound consult note Reason for Consult: VAC to abdominal wound.  Patient in [place], PA-C in room with patient.  Stated she was able to get one folded 4 x 4 gauze dressing with saline in the wound and it will not be needing VAC therapy.  Nurses to continue providing wound care. Monitor the wound area(s) for worsening of condition such as: Signs/symptoms of infection,  Increase in size,  Development of or worsening of odor, Development of pain, or increased pain at the affected locations.  Notify the medical team if any of these develop.  Thank you for the consult.  Discussed plan of care with the patient and PA.  [place] nurse will not follow at this time.  Please re-consult the [place] team if needed.  Val Riles, RN, MSN, CWOCN, CNS-BC, pager [phone]

## 2017-12-04 NOTE — Progress Notes (Signed)
Patient given discharge instructions and patient verbalized understanding. Patient's wife instructed on dressing changes. Did teach back, with hands on assistance. JP drain was removed per verbal order. Patient tolerated well.  Patient left unit in stable condition via wheelchair with nursing staff and  with all of belongings.

## 2017-12-04 NOTE — Discharge Summary (Signed)
[facility]/Trauma Discharge Summary   Patient ID: James Lee MRN: [id number] DOB/AGE: August 22, 1950 67 y.o.  Admit date: 11/28/2017 Discharge date: 12/04/2017  Admitting Diagnosis: Purulent contained perforated ulcer  Discharge Diagnosis Patient Active Problem List   Diagnosis Date Noted  . Perforated bowel ([place]) 11/29/2017  . Gastric ulcer 11/29/2017  . Cervical vertebral fusion 11/10/2017  . Coronary artery calcification seen on CAT scan 09/02/2014  . Benign essential HTN 09/02/2014  . Hyperlipemia 09/02/2014  . Sleep apnea   . Pulmonary nodules 09/01/2014  . DOE (dyspnea on exertion) 09/01/2014    Consultants none  Imaging: No results found.  Procedures Dr. Rosendo Gros (11/29/17) - Laparoscopy diagnostic, exploratory laparotomy, placement of surgical drain  HPI: Patient is a 67 year old male, with a history of dyspnea on exertion, CAD, hypertension, hyperlipidemia, diabetes and recently underwent cervical fusion by Dr. Ronnald Ramp.  Patient states over the last 2 to 3 weeks she has had abdominal pain.  In discussing with him and his wife the patient was taken approximately 1000 mg of naproxen as well as 6 pills of Aleve per day.  Patient states he was recently seen by his PCP and was told to stop this over the last several weeks.  Patient has been taking OxyContin for pain secondary to her cervical fusion however has been not taking this over the last 2 to 3 days.    He comes in today with a 10-hour history of abdominal pain.  Patient states that the pain was in the epigastrium.  Patient states that secondary to continued abdominal pain he presented to the ER.  Upon evaluation in the ED he underwent CT scan which revealed a likely contained perforation of the gastrum as well as possible duodenum.  I did reveal the studies personally.  There is no free air however there appear to be extraluminal perforation.  Patient did have elevated WBC count, and creatinine.  Secondary  to the CT scan findings general surgery was consulted for further evaluation and management.  Hospital Course:  Workup showed possible perforated gastric and duodenal ulcer.  Patient was admitted and underwent procedure listed above.  Tolerated procedure well and was transferred to the floor. UGI showed no leak. NGT was removed on POD#2. Diet was advanced as tolerated. Surgical drain was removed prior to discharge.  On POD#5, the patient was voiding well, tolerating diet, ambulating well, pain well controlled, vital signs stable, incisions c/d/i and felt stable for discharge home.  Patient will follow up as outlined below and knows to call with questions or concerns.   Patient was discharged in good condition.  The [place] Substance controlled database was reviewed prior to prescribing narcotic pain medication to this patient.  Physical Exam: General:  Alert, NAD, pleasant, cooperative Cardio: RRR, S1 & S2 normal, no murmur, rubs, gallops Resp: Effort normal, lungs CTA bilaterally, no wheezes, rales, rhonchi Abd:  Soft, ND, normal bowel sounds, mild TTP in LUQ, no guarding, midline incision with good base of granulation tissue (see photo below), drain with minimal serosanguinous drainage   Skin: no rashes noted, warm and dry      Allergies as of 12/04/2017   No Known Allergies     Medication List    STOP taking these medications   naproxen 500 MG tablet Commonly known as:  NAPROSYN     TAKE these medications   amLODipine 10 MG tablet Commonly known as:  NORVASC Take 10 mg by mouth daily.   aspirin 81 MG tablet Take 81  mg by mouth daily.   BYSTOLIC 5 MG tablet Generic drug:  nebivolol TAKE 1 TABLET BY MOUTH EVERY DAY   docusate sodium 100 MG capsule Commonly known as:  COLACE Take 100 mg by mouth 2 (two) times daily.   gabapentin 300 MG capsule Commonly known as:  NEURONTIN Take 300 mg by mouth at bedtime.   metFORMIN 500 MG tablet Commonly known as:   GLUCOPHAGE Take 1,000 mg by mouth 2 (two) times daily with a meal.   methocarbamol 500 MG tablet Commonly known as:  ROBAXIN Take 1 tablet (500 mg total) by mouth every 6 (six) hours as needed for muscle spasms.   multivitamin with minerals tablet Take 1 tablet by mouth daily.   naloxegol oxalate 25 MG Tabs tablet Commonly known as:  MOVANTIK Take 1 tablet (25 mg total) by mouth daily.   oxyCODONE 5 MG immediate release tablet Commonly known as:  Oxy IR/ROXICODONE Take 1 tablet (5 mg total) by mouth every 6 (six) hours as needed for moderate pain, severe pain or breakthrough pain. What changed:    medication strength  how much to take  reasons to take this   pantoprazole 40 MG tablet Commonly known as:  PROTONIX Take 1 tablet (40 mg total) by mouth daily.   simvastatin 40 MG tablet Commonly known as:  ZOCOR Take 40 mg by mouth daily.   valsartan-hydrochlorothiazide 320-25 MG tablet Commonly known as:  DIOVAN-HCT Take 1 tablet by mouth daily.        [facility] up.   Why:  Home health RN and PT Contact information: [address] [place] [address] [phone]        Ralene Ok, MD. Go on 12/21/2017.   Specialty:  General Surgery Why:  at 11:50am. Please arrive 20 minutes prior to complete paperwork. Please bring photo ID and insurance card Contact information: [place] [address] [phone]        [facility]. Schedule an appointment as soon as possible for a visit in 4 week(s).   Specialty:  Gastroenterology Why:  to have upper endoscopy to evaluate swelling seen on upper GI study.  Contact information: [place] [address] [phone]          Signed: Fraser Din [facility] 12/04/2017, 10:08 AM Pager: [phone] Consults: [phone] Mon-Fri 7:00 am-4:30 pm Sat-Sun 7:00  am-11:30 am

## 2017-12-04 NOTE — Care Management Note (Signed)
Case Management Note  Patient Details  Name: James Lee MRN: [id number] Date of Birth: 22-Apr-1951  Subjective/Objective:                    Action/Plan:  Confirmed face sheet information with patient. Patient's wife willing to be taught how to change wet to dry dressing change. Bedside nurse Oley Balm will teach .  Expected Discharge Date:                  Expected Discharge Plan:  Home/Self Care  In-House Referral:     Discharge planning Services  CM Consult  Post Acute Care Choice:  Home Health Choice offered to:  Patient  DME Arranged:  N/A DME Agency:  NA  HH Arranged:  PT, RN [place] Agency:  [place]  Status of Service:  Completed, signed off  If discussed at [place] of Stay Meetings, dates discussed:    Additional Comments:  Marilu Favre, RN 12/04/2017, 9:53 AM

## 2017-12-04 NOTE — Discharge Instructions (Signed)
[facility]      [facility] °[phone] ° °OPEN ABDOMINAL SURGERY: POST OP INSTRUCTIONS ° °Always review your discharge instruction sheet given to you by the facility where your surgery was performed. ° °IF YOU HAVE DISABILITY OR FAMILY LEAVE FORMS, YOU MUST BRING THEM TO THE OFFICE FOR PROCESSING.  PLEASE DO NOT GIVE THEM TO YOUR DOCTOR. ° °1. A prescription for pain medication may be given to you upon discharge.  Take your pain medication as prescribed, if needed.  If narcotic pain medicine is not needed, then you may take acetaminophen (Tylenol) or ibuprofen (Advil) as needed. °2. Take your usually prescribed medications unless otherwise directed. °3. If you need a refill on your pain medication, please contact your pharmacy. They will contact our office to request authorization.  Prescriptions will not be filled after 5pm or on week-ends. °4. You should follow a light diet the first few days after arrival home, such as soup and crackers, pudding, etc.unless your doctor has advised otherwise. A high-fiber, low fat diet can be resumed as tolerated.   Be sure to include lots of fluids daily. Most patients will experience some swelling and bruising on the chest and neck area.  Ice packs will help.  Swelling and bruising can take several days to resolve °5. Most patients will experience some swelling and bruising in the area of the incision. Ice pack will help. Swelling and bruising can take several days to resolve..  °6. It is common to experience some constipation if taking pain medication after surgery.  Increasing fluid intake and taking a stool softener will usually help or prevent this problem from occurring.  A mild laxative (Milk of Magnesia or Miralax) should be taken according to package directions if there are no bowel movements after 48 hours. °7.  You may have steri-strips (small skin tapes) in place directly over the incision.  These strips should be left on the skin for 7-10 days.  If your  surgeon used skin glue on the incision, you may shower in 24 hours.  The glue will flake off over the next 2-3 weeks.  Any sutures or staples will be removed at the office during your follow-up visit. You may find that a light gauze bandage over your incision may keep your staples from being rubbed or pulled. You may shower and replace the bandage daily. °8. ACTIVITIES:  You may resume regular (light) daily activities beginning the next day--such as daily self-care, walking, climbing stairs--gradually increasing activities as tolerated.  You may have sexual intercourse when it is comfortable.  Refrain from any heavy lifting or straining until approved by your doctor. °a. You may drive when you no longer are taking prescription pain medication, you can comfortably wear a seatbelt, and you can safely maneuver your car and apply brakes °b. Return to Work: ___________________________________ °9. You should see your doctor in the office for a follow-up appointment approximately two weeks after your surgery.  Make sure that you call for this appointment within a day or two after you arrive home to insure a convenient appointment time. °OTHER INSTRUCTIONS:  °_____________________________________________________________ °_____________________________________________________________ ° °WHEN TO CALL YOUR DOCTOR: °1. Fever over 101.0 °2. Inability to urinate °3. Nausea and/or vomiting °4. Extreme swelling or bruising °5. Continued bleeding from incision. °6. Increased pain, redness, or drainage from the incision. °7. Difficulty swallowing or breathing °8. Muscle cramping or spasms. °9. Numbness or tingling in hands or feet or around lips. ° °The clinic staff is available to   answer your questions during regular business hours.  Please dont hesitate to call and ask to speak to one of the nurses if you have concerns.  For further questions, please visit [email or web address]   MIDLINE WOUND CARE: - midline  dressing to be changed once daily - supplies: sterile saline, kerlix, scissors, ABD pads, tape  - remove dressing and all packing carefully, moistening with sterile saline as needed to avoid packing/internal dressing sticking to the wound. - clean edges of skin around the wound with water/gauze, making sure there is no tape debris or leakage left on skin that could cause skin irritation or breakdown. - dampen and clean kerlix with sterile saline and pack wound from wound base to skin level, making sure to take note of any possible areas of wound tracking, tunneling and packing appropriately. Wound can be packed loosely. Trim kerlix to size if a whole kerlix is not required. - cover wound with a dry ABD pad and secure with tape.  - write the date/time on the dry dressing/tape to better track when the last dressing change occurred. - apply any skin protectant/powder recommended by clinician to protect skin/skin folds. - change dressing as needed if leakage occurs, wound gets contaminated, or patient requests to shower. - patient may shower daily with wound open and following the shower the wound should be dried and a clean dressing placed.    DO NOT shower for 3 days. After 3 days you can remove the drain bandage and shower. Do not submerge your wounds in water until they are healed.   YOU need to follow up with gastroenterology. UGI showed some narrowing of the gastric body and antrum, possibly secondary to edema.  Will need endoscopy as outpatient to rule out mass as well.

## 2018-01-01 ENCOUNTER — Ambulatory Visit: Payer: BLUE CROSS/BLUE SHIELD | Admitting: Physician Assistant

## 2018-01-02 ENCOUNTER — Ambulatory Visit (INDEPENDENT_AMBULATORY_CARE_PROVIDER_SITE_OTHER): Payer: BLUE CROSS/BLUE SHIELD | Admitting: Physician Assistant

## 2018-01-02 ENCOUNTER — Encounter: Payer: Self-pay | Admitting: Physician Assistant

## 2018-01-02 ENCOUNTER — Other Ambulatory Visit (INDEPENDENT_AMBULATORY_CARE_PROVIDER_SITE_OTHER): Payer: BLUE CROSS/BLUE SHIELD

## 2018-01-02 VITALS — BP 138/90 | HR 66 | Ht 71.0 in | Wt 241.2 lb

## 2018-01-02 DIAGNOSIS — R9389 Abnormal findings on diagnostic imaging of other specified body structures: Secondary | ICD-10-CM

## 2018-01-02 DIAGNOSIS — K255 Chronic or unspecified gastric ulcer with perforation: Secondary | ICD-10-CM

## 2018-01-02 LAB — CBC WITH DIFFERENTIAL/PLATELET
BASOS ABS: 0.1 10*3/uL (ref 0.0–0.1)
Basophils Relative: 0.9 % (ref 0.0–3.0)
EOS PCT: 1.6 % (ref 0.0–5.0)
Eosinophils Absolute: 0.2 10*3/uL (ref 0.0–0.7)
HCT: 39.9 % (ref 39.0–52.0)
HEMOGLOBIN: 13.3 g/dL (ref 13.0–17.0)
LYMPHS PCT: 26.8 % (ref 12.0–46.0)
Lymphs Abs: 2.8 10*3/uL (ref 0.7–4.0)
MCHC: 33.4 g/dL (ref 30.0–36.0)
MCV: 91.8 fl (ref 78.0–100.0)
MONOS PCT: 6.5 % (ref 3.0–12.0)
Monocytes Absolute: 0.7 10*3/uL (ref 0.1–1.0)
NEUTROS PCT: 64.2 % (ref 43.0–77.0)
Neutro Abs: 6.8 10*3/uL (ref 1.4–7.7)
Platelets: 232 10*3/uL (ref 150.0–400.0)
RBC: 4.35 Mil/uL (ref 4.22–5.81)
RDW: 14.8 % (ref 11.5–15.5)
WBC: 10.5 10*3/uL (ref 4.0–10.5)

## 2018-01-02 MED ORDER — PANTOPRAZOLE SODIUM 40 MG PO TBEC: DELAYED_RELEASE_TABLET | ORAL | 6 refills | Status: DC

## 2018-01-02 NOTE — Progress Notes (Signed)
Subjective:    Patient ID: James Lee, male    DOB: 29-Aug-1950, 67 y.o.   MRN: [id number]  HPI James Lee is a pleasant 67 year old white male, new to GI today referred by [facility] surgery/Dr. Rosendo Gros for consideration of EGD. Patient underwent a cervical laminectomy in May 2019 and says that he was taking a lot of prescription strength naproxen in addition to at least 3 Aleve daily for pain control so that he was able to return to work.  He required admission on 11/28/2017 after he presented to the emergency room with 10-day history of upper abdominal pain, and then 10 hours of acutely worsening severe abdominal pain.  He had CT of the abdomen and pelvis done which showed an abnormal stomach with what appeared to be a large contained gastric perforation situated posterior to the true gastric lumen along the lesser curve with associated perigastric inflammation but no free intraperitoneal air or fluid or reactive regional lymph nodes there is also a second smaller gastric antrum ulceration or contained perforation and inflamed appearance of the duodenal bulb.  The area of extraluminal air and fluid collection measured 11 x 3.7 x 4.3 cm. He underwent exploratory laparotomy by Dr. Rosendo Gros the following day found to have a large pocket of purulence in the lesser sac.  There was a large amount of inflammation and induration of the posterior gastric wall but no perforation that could be seen.  A bubble test was done, however there was no perforation visible.  He then had placement of a JP drain into the lesser sac. Patient was treated with IV antibiotics, and PPI.  He underwent postoperative upper GI in 12/01/2017 that showed narrowing and irregular contour of the distal gastric body and antrum most likely due to postop edema no evidence of contrast leak or gastric outlet obstruction. Patient was discharged on Protonix 40 mg once daily.  It was felt most likely he had a gastric ulcer that had perforated and  that this was likely NSAID induced that she had been taking high doses of NSAIDs.  H. pylori IgM IgG were checked during hospitalization and were negative. Patient has been seen in follow-up by surgery, his abdominal wound is healing and JP has been removed.  He says he feels pretty good, is eating without difficulty though he does get full a bit faster than normal.  He has no ongoing abdominal pain, no nausea or vomiting and bowel movements have been normal.  He has not had any NSAIDs since discharge from the hospital. Patient says unfortunately has to have another spine surgery done in a few months as he needs a lumbar fusion. Family history is negative for colon cancer.  Patient has had prior colonoscopies done by [facility]  and believes the last one was done about a year ago.  He has history of colon polyps.  Review of Systems;Pertinent positive and negative review of systems were noted in the above HPI section.  All other review of systems was otherwise negative.  Outpatient Encounter Medications as of 01/02/2018  Medication Sig  . amLODipine (NORVASC) 10 MG tablet Take 10 mg by mouth daily.  Marland Kitchen aspirin 81 MG tablet Take 81 mg by mouth daily.  Marland Kitchen gabapentin (NEURONTIN) 300 MG capsule Take 300 mg by mouth at bedtime.   . metFORMIN (GLUCOPHAGE) 500 MG tablet Take 1,000 mg by mouth 2 (two) times daily with a meal.  . Multiple Vitamins-Minerals (MULTIVITAMIN WITH MINERALS) tablet Take 1 tablet by mouth daily.  Marland Kitchen  nebivolol (BYSTOLIC) 5 MG tablet TAKE 1 TABLET BY MOUTH EVERY DAY  . simvastatin (ZOCOR) 40 MG tablet Take 40 mg by mouth daily.   . valsartan-hydrochlorothiazide (DIOVAN-HCT) 320-25 MG tablet Take 1 tablet by mouth daily.  . pantoprazole (PROTONIX) 40 MG tablet Take 1 tablet by mouth every morning.  . [DISCONTINUED] docusate sodium (COLACE) 100 MG capsule Take 100 mg by mouth 2 (two) times daily.  . [DISCONTINUED] methocarbamol (ROBAXIN) 500 MG tablet Take 1 tablet (500 mg total)  by mouth every 6 (six) hours as needed for muscle spasms.  . [DISCONTINUED] naloxegol oxalate (MOVANTIK) 25 MG TABS tablet Take 1 tablet (25 mg total) by mouth daily.  . [DISCONTINUED] oxyCODONE (OXY IR/ROXICODONE) 5 MG immediate release tablet Take 1 tablet (5 mg total) by mouth every 6 (six) hours as needed for moderate pain, severe pain or breakthrough pain.  . [DISCONTINUED] pantoprazole (PROTONIX) 40 MG tablet Take 1 tablet (40 mg total) by mouth daily.   No facility-administered encounter medications on file as of 01/02/2018.    No Known Allergies Patient Active Problem List   Diagnosis Date Noted  . Perforated bowel ([place]) 11/29/2017  . Gastric ulcer 11/29/2017  . Cervical vertebral fusion 11/10/2017  . Coronary artery calcification seen on CAT scan 09/02/2014  . Benign essential HTN 09/02/2014  . Hyperlipemia 09/02/2014  . Sleep apnea   . Pulmonary nodules 09/01/2014  . DOE (dyspnea on exertion) 09/01/2014   Social History   Socioeconomic History  . Marital status: Married    Spouse name: Not on file  . Number of children: 2  . Years of education: Not on file  . Highest education level: Not on file  Occupational History  . Not on file  Social Needs  . Financial resource strain: Not on file  . Food insecurity:    Worry: Not on file    Inability: Not on file  . Transportation needs:    Medical: Not on file    Non-medical: Not on file  Tobacco Use  . Smoking status: Current Every Day Smoker    Packs/day: 1.00    Years: 40.00    Pack years: 40.00    Types: Cigarettes  . Smokeless tobacco: Never Used  Substance and Sexual Activity  . Alcohol use: No    Alcohol/week: 0.0 oz    Comment: rarely  . Drug use: No  . Sexual activity: Not on file  Lifestyle  . Physical activity:    Days per week: Not on file    Minutes per session: Not on file  . Stress: Not on file  Relationships  . Social connections:    Talks on phone: Not on file    Gets together: Not on file      Attends religious service: Not on file    Active member of club or organization: Not on file    Attends meetings of clubs or organizations: Not on file    Relationship status: Not on file  . Intimate partner violence:    Fear of current or ex partner: Not on file    Emotionally abused: Not on file    Physically abused: Not on file    Forced sexual activity: Not on file  Other Topics Concern  . Not on file  Social History Narrative  . Not on file    Mr. Fix family history includes Cancer in his father; Coronary artery disease in his mother; Prostate cancer in his brother; Throat cancer in his father.  Objective:    Vitals:   01/02/18 1028  BP: 138/90  Pulse: 66    Physical Exam; well-developed older white male in no acute distress, pleasant blood pressure 138/90 pulse 66, height 5 foot 11, weight 241, BMI of 33.6.  HEENT; nontraumatic normocephalic EOMI PERRLA sclera anicteric, oropharynx benign Cardiovascular ;regular rate and rhythm with S1-S2 no murmur rub or gallop, Pulmonary; clear bilaterally, Abdomen; soft, he has midline incisional scar which is healing, he is nontender to palpation no palpable mass or hepatosplenomegaly bowel sounds are present, Rectal ;exam not done, Extremities ;no clubbing cyanosis or edema skin warm dry, Neuro psych; alert and oriented, grossly nonfocal mood and affect appropriate       Assessment & Plan:   #36 67 year old white male with recent hospitalization 6/18 through 12/04/2017 with acute severe abdominal pain found to have a contained perforation of the posterior gastric wall associated fluid collection/abscess. Patient had exploratory laparotomy 11/29/2017 and had drainage of the fluid collection JP left in place.  No evidence of perforation at the time of surgery. Postop upper GI - 2 days later showed no evidence of leak did show significant edema of the gastric body and antrum as had admission CT.  Most likely patient had a  gastric ulcer with perforation that sealed.  He had been on high-dose NSAIDs after a cervical laminectomy in May 2019. He is doing well postoperatively and is now 4 weeks out.  Rule out gastric ulcer, rule out malignancy less likely  #2 history of colon polyps-prior colonoscopies per Dr. Barron Schmid GI last exam 1 year ago #3 lumbar disc disease-and anticipating upcoming lumbar fusion #4 hypertension  Plan; Restart Protonix 40 mg p.o. every morning.  Advised patient he will benefit from staying on PPI therapy at least until he gets through his next back surgery. Avoid all NSAIDs Patient will be scheduled for upper endoscopy with Dr. Silverio Decamp .  Procedure was discussed in detail with the patient including indications risks and benefits and he is agreeable to proceed. Repeat CBC today, patient was mildly anemic on discharge Patient has signed a release and we will obtain copies of his prior records from [place].   Amy S Esterwood PA-C 01/02/2018   Cc: Aura Dials, MD

## 2018-01-02 NOTE — Patient Instructions (Addendum)
  If you are age 65 or younger, your body mass index should be between 19-25. Your Body mass index is 33.64 kg/m. If this is out of the aformentioned range listed, please consider follow up with your Primary Care Provider.   Your provider has requested that you go to the basement level for lab work before leaving today. Press "B" on the elevator. The lab is located at the first door on the left as you exit the elevator. We sent a prescription for the Pantoprazole sodium 49 mg to your pharmacy. No aspirin, Aleve, Advil or Naproxen.   You have been scheduled for an endoscopy. Please follow written instructions given to you at your visit today. If you use inhalers (even only as needed), please bring them with you on the day of your procedure.

## 2018-01-17 NOTE — Progress Notes (Signed)
Reviewed and agree with documentation and assessment and plan. K. Veena Kyleen Villatoro , MD   

## 2018-01-18 ENCOUNTER — Ambulatory Visit (AMBULATORY_SURGERY_CENTER): Payer: BLUE CROSS/BLUE SHIELD | Admitting: Gastroenterology

## 2018-01-18 ENCOUNTER — Encounter: Payer: Self-pay | Admitting: Gastroenterology

## 2018-01-18 VITALS — BP 132/88 | HR 62 | Temp 99.6°F | Resp 16 | Ht 71.0 in | Wt 241.0 lb

## 2018-01-18 DIAGNOSIS — K251 Acute gastric ulcer with perforation: Secondary | ICD-10-CM

## 2018-01-18 DIAGNOSIS — R9389 Abnormal findings on diagnostic imaging of other specified body structures: Secondary | ICD-10-CM

## 2018-01-18 MED ORDER — SODIUM CHLORIDE 0.9 % IV SOLN: 500.0000 mL | Freq: Once | INTRAVENOUS | Status: DC

## 2018-01-18 NOTE — Patient Instructions (Signed)
**   AVOID NSAIDS OR ANY ANTI-INFLAMMATORY DRUGS (iBUPROFEN, ASPIRIN, NAPROXEN, ADVIL)  ** TYLENOL IS OKAY TO TAKE **  YOU HAD AN ENDOSCOPIC PROCEDURE TODAY AT THE [facility]:   Refer to the procedure report that was given to you for any specific questions about what was found during the examination.  If the procedure report does not answer your questions, please call your gastroenterologist to clarify.  If you requested that your care partner not be given the details of your procedure findings, then the procedure report has been included in a sealed envelope for you to review at your convenience later.  YOU SHOULD EXPECT: Some feelings of bloating in the abdomen. Passage of more gas than usual.  Walking can help get rid of the air that was put into your GI tract during the procedure and reduce the bloating. If you had a lower endoscopy (such as a colonoscopy or flexible sigmoidoscopy) you may notice spotting of blood in your stool or on the toilet paper. If you underwent a bowel prep for your procedure, you may not have a normal bowel movement for a few days.  Please Note:  You might notice some irritation and congestion in your nose or some drainage.  This is from the oxygen used during your procedure.  There is no need for concern and it should clear up in a day or so.  SYMPTOMS TO REPORT IMMEDIATELY:   Following upper endoscopy (EGD)  Vomiting of blood or coffee ground material  New chest pain or pain under the shoulder blades  Painful or persistently difficult swallowing  New shortness of breath  Fever of 100F or higher  Black, tarry-looking stools  For urgent or emergent issues, a gastroenterologist can be reached at any hour by calling [phone].   DIET:  We do recommend a small meal at first, but then you may proceed to your regular diet.  Drink plenty of fluids but you should avoid alcoholic beverages for 24 hours.  ACTIVITY:  You should plan to take it easy for the  rest of today and you should NOT DRIVE or use heavy machinery until tomorrow (because of the sedation medicines used during the test).    FOLLOW UP: Our staff will call the number listed on your records the next business day following your procedure to check on you and address any questions or concerns that you may have regarding the information given to you following your procedure. If we do not reach you, we will leave a message.  However, if you are feeling well and you are not experiencing any problems, there is no need to return our call.  We will assume that you have returned to your regular daily activities without incident.  If any biopsies were taken you will be contacted by phone or by letter within the next 1-3 weeks.  Please call us at [phone] if you have not heard about the biopsies in 3 weeks.    SIGNATURES/CONFIDENTIALITY: You and/or your care partner have signed paperwork which will be entered into your electronic medical record.  These signatures attest to the fact that that the information above on your After Visit Summary has been reviewed and is understood.  Full responsibility of the confidentiality of this discharge information lies with you and/or your care-partner.

## 2018-01-18 NOTE — Progress Notes (Signed)
Report given to PACU, vss 

## 2018-01-18 NOTE — Progress Notes (Signed)
Called to room to assist during endoscopic procedure.  Patient ID and intended procedure confirmed with present staff. Received instructions for my participation in the procedure from the performing physician.  

## 2018-01-18 NOTE — Op Note (Addendum)
[facility] Patient Name: James Lee Procedure Date: 01/18/2018 10:41 AM MRN: [id number] Endoscopist: Mauri Pole , MD Age: 67 Referring MD:  Date of Birth: 1950/12/02 Gender: Male Account #: [id number] Procedure:                Upper GI endoscopy Indications:              Endoscopy to confirm gastric ulcer that was                            demonstrated on previous imaging study. Perforated                            gastric ulcer s/p omental patch repair Medicines:                Monitored Anesthesia Care Procedure:                Pre-Anesthesia Assessment:                           - Prior to the procedure, a History and Physical                            was performed, and patient medications and                            allergies were reviewed. The patient's tolerance of                            previous anesthesia was also reviewed. The risks                            and benefits of the procedure and the sedation                            options and risks were discussed with the patient.                            All questions were answered, and informed consent                            was obtained. Prior Anticoagulants: The patient has                            taken no previous anticoagulant or antiplatelet                            agents. ASA Grade Assessment: II - A patient with                            mild systemic disease. After reviewing the risks                            and benefits, the patient was deemed in  satisfactory condition to undergo the procedure.                           After obtaining informed consent, the endoscope was                            passed under direct vision. Throughout the                            procedure, the patient's blood pressure, pulse, and                            oxygen saturations were monitored continuously. The                            Endoscope was  introduced through the mouth, and                            advanced to the second part of duodenum. The upper                            GI endoscopy was accomplished without difficulty.                            The patient tolerated the procedure well. Scope In: Scope Out: Findings:                 The Z-line was regular and was found 38 cm from the                            incisors.                           The examined esophagus was normal.                           One non-bleeding cratered gastric ulcer with a                            clean ulcer base (Forrest Class III) was found at                            the pylorus. The lesion was 3 mm in largest                            dimension. Biopsies were taken with a cold forceps                            for histology.                           Patchy mild inflammation was found in the entire                            examined stomach. Biopsies  were taken with a cold                            forceps for Helicobacter pylori testing.                           The examined duodenum was normal. Complications:            No immediate complications. Estimated Blood Loss:     Estimated blood loss was minimal. Impression:               - Z-line regular, 38 cm from the incisors.                           - Normal esophagus.                           - Non-bleeding gastric ulcer with a clean ulcer                            base (Forrest Class III). Biopsied.                           - Gastritis. Biopsied.                           - Normal examined duodenum. Recommendation:           - Patient has a contact number available for                            emergencies. The signs and symptoms of potential                            delayed complications were discussed with the                            patient. Return to normal activities tomorrow.                            Written discharge instructions were provided to the                             patient.                           - Resume previous diet.                           - Continue present medications.                           - No aspirin, ibuprofen, naproxen, or other                            non-steroidal anti-inflammatory drugs.                           -  Await pathology results.                           - Repeat upper endoscopy after studies are complete                            for surveillance based on pathology results. Mauri Pole, MD 01/18/2018 11:00:02 AM This report has been signed electronically.

## 2018-01-19 ENCOUNTER — Telehealth: Payer: Self-pay

## 2018-01-19 NOTE — Telephone Encounter (Signed)
  Follow up Call-  Call back number 01/18/2018  Post procedure Call Back phone  # [phone] cell  Permission to leave phone message Yes  Some recent data might be hidden     Patient questions:  Do you have a fever, pain , or abdominal swelling? No. Pain Score  0 *  Have you tolerated food without any problems? Yes.    Have you been able to return to your normal activities? Yes.    Do you have any questions about your discharge instructions: Diet   No. Medications  No. Follow up visit  No.  Do you have questions or concerns about your Care? No.  Actions: * If pain score is 4 or above: No action needed, pain <4.

## 2018-01-26 ENCOUNTER — Other Ambulatory Visit: Payer: Self-pay

## 2018-01-26 ENCOUNTER — Telehealth: Payer: Self-pay | Admitting: Gastroenterology

## 2018-01-26 ENCOUNTER — Telehealth: Payer: Self-pay

## 2018-01-26 DIAGNOSIS — A048 Other specified bacterial intestinal infections: Secondary | ICD-10-CM

## 2018-01-26 DIAGNOSIS — K251 Acute gastric ulcer with perforation: Secondary | ICD-10-CM

## 2018-01-26 MED ORDER — BIS SUBCIT-METRONID-TETRACYC 140-125-125 MG PO CAPS: 3.0000 | ORAL_CAPSULE | Freq: Three times a day (TID) | ORAL | 0 refills | Status: DC

## 2018-01-26 MED ORDER — PANTOPRAZOLE SODIUM 40 MG PO TBEC: 40.0000 mg | DELAYED_RELEASE_TABLET | Freq: Two times a day (BID) | ORAL | 0 refills | Status: DC

## 2018-01-26 NOTE — Telephone Encounter (Signed)
Discussed the positive h pylori finding on the pathology. Pylera is covered by his insurance. Patient agrees to this treatment plan.

## 2018-01-26 NOTE — Telephone Encounter (Signed)
Pt needs to speak with you regarding meds for H-Pylori,

## 2018-01-29 NOTE — Telephone Encounter (Signed)
Patient wanted to let me know the [facility] has to order the Pylera. Though he has insurance coverage, he was unable to obtain Pylera as planned. He will keep  Me informed. Hopes to start the medication soon.

## 2018-02-05 DIAGNOSIS — M654 Radial styloid tenosynovitis [de Quervain]: Secondary | ICD-10-CM | POA: Insufficient documentation

## 2018-02-20 ENCOUNTER — Other Ambulatory Visit: Payer: Self-pay | Admitting: Neurological Surgery

## 2018-02-23 ENCOUNTER — Other Ambulatory Visit: Payer: Self-pay | Admitting: Neurological Surgery

## 2018-02-23 DIAGNOSIS — M25511 Pain in right shoulder: Secondary | ICD-10-CM

## 2018-03-01 ENCOUNTER — Ambulatory Visit
Admission: RE | Admit: 2018-03-01 | Discharge: 2018-03-01 | Disposition: A | Discharge status: Home / Self Care | Payer: BLUE CROSS/BLUE SHIELD | Source: Ambulatory Visit | Attending: Neurological Surgery | Admitting: Neurological Surgery

## 2018-03-01 DIAGNOSIS — M25511 Pain in right shoulder: Secondary | ICD-10-CM

## 2018-03-05 NOTE — Pre-Procedure Instructions (Signed)
James Lee  03/05/2018      [id number] [address], [place] [address] Phone: [phone] Fax: [phone]    Your procedure is scheduled on Friday, September 27th.  Report to [facility] Admitting at 8:15 A.M.  Call this number if you have problems the morning of surgery:  [phone]   Remember:  Do not eat or drink after midnight.     Take these medicines the morning of surgery with A SIP OF WATER  amLODipine (NORVASC)  nebivolol (BYSTOLIC pantoprazole (PROTONIX)   Follow your surgeon's instructions on when to stop Asprin.  If no instructions were given by your surgeon then you will need to call the office to get those instructions.    7 days prior to surgery STOP taking any Aspirin(unless otherwise instructed by your surgeon), Aleve, Naproxen, Ibuprofen, Motrin, Advil, Goody's, BC's, all herbal medications, fish oil, and all vitamins   WHAT DO I DO ABOUT MY DIABETES MEDICATION?   Marland Kitchen Do not take oral diabetes medicines (pills) the morning of surgery. DO NOT TAKE YOUR metFORMIN (GLUCOPHAGE) the morning of surgery.    How to Manage Your Diabetes Before and After Surgery  Why is it important to control my blood sugar before and after surgery? . Improving blood sugar levels before and after surgery helps healing and can limit problems. . A way of improving blood sugar control is eating a healthy diet by: o  Eating less sugar and carbohydrates o  Increasing activity/exercise o  Talking with your doctor about reaching your blood sugar goals . High blood sugars (greater than 180 mg/dL) can raise your risk of infections and slow your recovery, so you will need to focus on controlling your diabetes during the weeks before surgery. . Make sure that the doctor who takes care of your diabetes knows about your planned surgery including the date and location.  How do I manage my blood sugar before  surgery? . Check your blood sugar at least 4 times a day, starting 2 days before surgery, to make sure that the level is not too high or low. o Check your blood sugar the morning of your surgery when you wake up and every 2 hours until you get to the Short Stay unit. . If your blood sugar is less than 70 mg/dL, you will need to treat for low blood sugar: o Do not take insulin. o Treat a low blood sugar (less than 70 mg/dL) with  cup of clear juice (cranberry or apple), 4 glucose tablets, OR glucose gel. o Recheck blood sugar in 15 minutes after treatment (to make sure it is greater than 70 mg/dL). If your blood sugar is not greater than 70 mg/dL on recheck, call [phone] for further instructions. . Report your blood sugar to the short stay nurse when you get to Short Stay.  . If you are admitted to the hospital after surgery: o Your blood sugar will be checked by the staff and you will probably be given insulin after surgery (instead of oral diabetes medicines) to make sure you have good blood sugar levels. o The goal for blood sugar control after surgery is 80-180 mg/dL.     Do not wear jewelry.  Do not wear lotions, powders, or colognes, or deodorant.  Men may shave face and neck.  Do not bring valuables to the hospital.  [facility] is not responsible for any belongings or valuables.  Contacts, dentures  or bridgework may not be worn into surgery.  Leave your suitcase in the car.  After surgery it may be brought to your room.  For patients admitted to the hospital, discharge time will be determined by your treatment team.  Patients discharged the day of surgery will not be allowed to drive home.   Special instructions:   [name]- Preparing For Surgery  Before surgery, you can play an important role. Because skin is not sterile, your skin needs to be as free of germs as possible. You can reduce the number of germs on your skin by washing with CHG (chlorahexidine gluconate) Soap  before surgery.  CHG is an antiseptic cleaner which kills germs and bonds with the skin to continue killing germs even after washing.    Oral Hygiene is also important to reduce your risk of infection.  Remember - BRUSH YOUR TEETH THE MORNING OF SURGERY WITH YOUR REGULAR TOOTHPASTE  Please do not use if you have an allergy to CHG or antibacterial soaps. If your skin becomes reddened/irritated stop using the CHG.  Do not shave (including legs and underarms) for at least 48 hours prior to first CHG shower. It is OK to shave your face.  Please follow these instructions carefully.   1. Shower the NIGHT BEFORE SURGERY and the MORNING OF SURGERY with CHG.   2. If you chose to wash your hair, wash your hair first as usual with your normal shampoo.  3. After you shampoo, rinse your hair and body thoroughly to remove the shampoo.  4. Use CHG as you would any other liquid soap. You can apply CHG directly to the skin and wash gently with a scrungie or a clean washcloth.   5. Apply the CHG Soap to your body ONLY FROM THE NECK DOWN.  Do not use on open wounds or open sores. Avoid contact with your eyes, ears, mouth and genitals (private parts). Wash Face and genitals (private parts)  with your normal soap.  6. Wash thoroughly, paying special attention to the area where your surgery will be performed.  7. Thoroughly rinse your body with warm water from the neck down.  8. DO NOT shower/wash with your normal soap after using and rinsing off the CHG Soap.  9. Pat yourself dry with a CLEAN TOWEL.  10. Wear CLEAN PAJAMAS to bed the night before surgery, wear comfortable clothes the morning of surgery  11. Place CLEAN SHEETS on your bed the night of your first shower and DO NOT SLEEP WITH PETS.    Day of Surgery:  Do not apply any deodorants/lotions.  Please wear clean clothes to the hospital/surgery center.   Remember to brush your teeth WITH YOUR REGULAR TOOTHPASTE.   Please read over the  following fact sheets that you were given.

## 2018-03-06 ENCOUNTER — Encounter (HOSPITAL_COMMUNITY)
Admission: RE | Admit: 2018-03-06 | Discharge: 2018-03-06 | Disposition: A | Discharge status: Home / Self Care | Payer: BLUE CROSS/BLUE SHIELD | Source: Ambulatory Visit | Attending: Neurological Surgery | Admitting: Neurological Surgery

## 2018-03-06 ENCOUNTER — Encounter (HOSPITAL_COMMUNITY): Payer: Self-pay

## 2018-03-06 DIAGNOSIS — M4310 Spondylolisthesis, site unspecified: Secondary | ICD-10-CM | POA: Diagnosis not present

## 2018-03-06 DIAGNOSIS — Z01818 Encounter for other preprocedural examination: Secondary | ICD-10-CM | POA: Diagnosis not present

## 2018-03-06 LAB — CBC WITH DIFFERENTIAL/PLATELET
Abs Immature Granulocytes: 0 10*3/uL (ref 0.0–0.1)
Basophils Absolute: 0.1 10*3/uL (ref 0.0–0.1)
Basophils Relative: 1 %
EOS PCT: 2 %
Eosinophils Absolute: 0.2 10*3/uL (ref 0.0–0.7)
HEMATOCRIT: 47.3 % (ref 39.0–52.0)
Hemoglobin: 15 g/dL (ref 13.0–17.0)
Immature Granulocytes: 0 %
Lymphocytes Relative: 29 %
Lymphs Abs: 2.8 10*3/uL (ref 0.7–4.0)
MCH: 29.2 pg (ref 26.0–34.0)
MCHC: 31.7 g/dL (ref 30.0–36.0)
MCV: 92 fL (ref 78.0–100.0)
MONO ABS: 0.7 10*3/uL (ref 0.1–1.0)
MONOS PCT: 7 %
Neutro Abs: 6 10*3/uL (ref 1.7–7.7)
Neutrophils Relative %: 61 %
Platelets: 258 10*3/uL (ref 150–400)
RBC: 5.14 MIL/uL (ref 4.22–5.81)
RDW: 13.4 % (ref 11.5–15.5)
WBC: 9.9 10*3/uL (ref 4.0–10.5)

## 2018-03-06 LAB — TYPE AND SCREEN
ABO/RH(D): A NEG
ANTIBODY SCREEN: NEGATIVE

## 2018-03-06 LAB — BASIC METABOLIC PANEL
Anion gap: 10 (ref 5–15)
BUN: 11 mg/dL (ref 8–23)
CO2: 29 mmol/L (ref 22–32)
CREATININE: 1.09 mg/dL (ref 0.61–1.24)
Calcium: 9.8 mg/dL (ref 8.9–10.3)
Chloride: 102 mmol/L (ref 98–111)
GFR calc Af Amer: >60 mL/min (ref >60)
GLUCOSE: 116 mg/dL — AB (ref 70–99)
Potassium: 4.6 mmol/L (ref 3.5–5.1)
Sodium: 141 mmol/L (ref 135–145)

## 2018-03-06 LAB — PROTIME-INR
INR: 0.88
Prothrombin Time: 11.9 seconds (ref 11.4–15.2)

## 2018-03-06 LAB — GLUCOSE, CAPILLARY: GLUCOSE-CAPILLARY: 103 mg/dL — AB (ref 70–99)

## 2018-03-06 LAB — HEMOGLOBIN A1C
Hgb A1c MFr Bld: 6.8 % — ABNORMAL HIGH (ref 4.8–5.6)
MEAN PLASMA GLUCOSE: 148.46 mg/dL

## 2018-03-06 LAB — SURGICAL PCR SCREEN
MRSA, PCR: NEGATIVE
Staphylococcus aureus: NEGATIVE

## 2018-03-06 NOTE — Progress Notes (Signed)
PCP - Dr. Aura Dials Cardiologist - Dr. Fransico Him (has not seen since 2016)  Chest x-ray - 03/06/18 EKG - 11/01/17 Stress Test - 09/17/14 ECHO - denies Cardiac Cath - denies  Sleep Study - positive for sleep apnea CPAP - uses every night, setting is 13  Fasting Blood Sugar - 100-130 Checks Blood Sugar every other day.  CBG at PAT appointment-103  Aspirin Instructions: Hold for 5-7 days prior to surgery. LD 03/02/18.   Anesthesia review: No   Patient denies shortness of breath, fever, cough and chest pain at PAT appointment   Patient verbalized understanding of instructions that were given to them at the PAT appointment. Patient was also instructed that they will need to review over the PAT instructions again at home before surgery.

## 2018-03-09 ENCOUNTER — Encounter (HOSPITAL_COMMUNITY): Payer: Self-pay

## 2018-03-09 ENCOUNTER — Inpatient Hospital Stay (HOSPITAL_COMMUNITY): Payer: BLUE CROSS/BLUE SHIELD

## 2018-03-09 ENCOUNTER — Inpatient Hospital Stay (HOSPITAL_COMMUNITY): Payer: BLUE CROSS/BLUE SHIELD | Admitting: Anesthesiology

## 2018-03-09 ENCOUNTER — Other Ambulatory Visit: Payer: Self-pay

## 2018-03-09 ENCOUNTER — Encounter (HOSPITAL_COMMUNITY)
Admission: RE | Disposition: A | Discharge status: Home / Self Care | Payer: Self-pay | Source: Home / Self Care | Attending: Neurological Surgery

## 2018-03-09 ENCOUNTER — Inpatient Hospital Stay (HOSPITAL_COMMUNITY)
Admission: RE | Admit: 2018-03-09 | Discharge: 2018-03-11 | DRG: 455 | Disposition: A | Discharge status: Home / Self Care | Payer: BLUE CROSS/BLUE SHIELD | Attending: Neurological Surgery | Admitting: Neurological Surgery

## 2018-03-09 DIAGNOSIS — Z7982 Long term (current) use of aspirin: Secondary | ICD-10-CM

## 2018-03-09 DIAGNOSIS — Z8711 Personal history of peptic ulcer disease: Secondary | ICD-10-CM | POA: Diagnosis not present

## 2018-03-09 DIAGNOSIS — Z23 Encounter for immunization: Secondary | ICD-10-CM | POA: Diagnosis not present

## 2018-03-09 DIAGNOSIS — M5126 Other intervertebral disc displacement, lumbar region: Secondary | ICD-10-CM | POA: Diagnosis present

## 2018-03-09 DIAGNOSIS — Z7984 Long term (current) use of oral hypoglycemic drugs: Secondary | ICD-10-CM | POA: Diagnosis not present

## 2018-03-09 DIAGNOSIS — M532X6 Spinal instabilities, lumbar region: Secondary | ICD-10-CM | POA: Diagnosis present

## 2018-03-09 DIAGNOSIS — K08409 Partial loss of teeth, unspecified cause, unspecified class: Secondary | ICD-10-CM | POA: Diagnosis present

## 2018-03-09 DIAGNOSIS — E785 Hyperlipidemia, unspecified: Secondary | ICD-10-CM | POA: Diagnosis present

## 2018-03-09 DIAGNOSIS — I10 Essential (primary) hypertension: Secondary | ICD-10-CM | POA: Diagnosis present

## 2018-03-09 DIAGNOSIS — M48061 Spinal stenosis, lumbar region without neurogenic claudication: Secondary | ICD-10-CM | POA: Diagnosis present

## 2018-03-09 DIAGNOSIS — K219 Gastro-esophageal reflux disease without esophagitis: Secondary | ICD-10-CM | POA: Diagnosis present

## 2018-03-09 DIAGNOSIS — E119 Type 2 diabetes mellitus without complications: Secondary | ICD-10-CM | POA: Diagnosis present

## 2018-03-09 DIAGNOSIS — Z981 Arthrodesis status: Secondary | ICD-10-CM | POA: Diagnosis not present

## 2018-03-09 DIAGNOSIS — M47816 Spondylosis without myelopathy or radiculopathy, lumbar region: Secondary | ICD-10-CM | POA: Diagnosis present

## 2018-03-09 DIAGNOSIS — G4730 Sleep apnea, unspecified: Secondary | ICD-10-CM | POA: Diagnosis present

## 2018-03-09 DIAGNOSIS — Z800 Family history of malignant neoplasm of digestive organs: Secondary | ICD-10-CM | POA: Diagnosis not present

## 2018-03-09 DIAGNOSIS — Z79899 Other long term (current) drug therapy: Secondary | ICD-10-CM | POA: Diagnosis not present

## 2018-03-09 DIAGNOSIS — M1990 Unspecified osteoarthritis, unspecified site: Secondary | ICD-10-CM | POA: Diagnosis present

## 2018-03-09 DIAGNOSIS — Z8546 Personal history of malignant neoplasm of prostate: Secondary | ICD-10-CM | POA: Diagnosis not present

## 2018-03-09 DIAGNOSIS — Z8042 Family history of malignant neoplasm of prostate: Secondary | ICD-10-CM

## 2018-03-09 DIAGNOSIS — F17210 Nicotine dependence, cigarettes, uncomplicated: Secondary | ICD-10-CM | POA: Diagnosis present

## 2018-03-09 DIAGNOSIS — Z8249 Family history of ischemic heart disease and other diseases of the circulatory system: Secondary | ICD-10-CM | POA: Diagnosis not present

## 2018-03-09 DIAGNOSIS — M4310 Spondylolisthesis, site unspecified: Secondary | ICD-10-CM

## 2018-03-09 LAB — GLUCOSE, CAPILLARY
GLUCOSE-CAPILLARY: 111 mg/dL — AB (ref 70–99)
GLUCOSE-CAPILLARY: 153 mg/dL — AB (ref 70–99)
GLUCOSE-CAPILLARY: 167 mg/dL — AB (ref 70–99)
Glucose-Capillary: 114 mg/dL — ABNORMAL HIGH (ref 70–99)
Glucose-Capillary: 239 mg/dL — ABNORMAL HIGH (ref 70–99)

## 2018-03-09 SURGERY — POSTERIOR LUMBAR FUSION 3 LEVEL
Anesthesia: General | Site: Back

## 2018-03-09 MED ORDER — LIDOCAINE 2% (20 MG/ML) 5 ML SYRINGE
INTRAMUSCULAR | Status: DC | PRN
  Administered 2018-03-09: 100 mg via INTRAVENOUS

## 2018-03-09 MED ORDER — SODIUM CHLORIDE 0.9% FLUSH: 3.0000 mL | INTRAVENOUS | Status: DC | PRN

## 2018-03-09 MED ORDER — ACETAMINOPHEN 650 MG RE SUPP: 650.0000 mg | RECTAL | Status: DC | PRN

## 2018-03-09 MED ORDER — POTASSIUM CHLORIDE IN NACL 20-0.9 MEQ/L-% IV SOLN
INTRAVENOUS | Status: DC
  Filled 2018-03-09: qty 1000

## 2018-03-09 MED ORDER — HYDROMORPHONE HCL 1 MG/ML IJ SOLN
INTRAMUSCULAR | Status: DC | PRN
  Administered 2018-03-09: 0.5 mg via INTRAVENOUS

## 2018-03-09 MED ORDER — ROCURONIUM BROMIDE 50 MG/5ML IV SOSY
PREFILLED_SYRINGE | INTRAVENOUS | Status: DC | PRN
  Administered 2018-03-09: 30 mg via INTRAVENOUS
  Administered 2018-03-09: 20 mg via INTRAVENOUS
  Administered 2018-03-09: 50 mg via INTRAVENOUS

## 2018-03-09 MED ORDER — VANCOMYCIN HCL 1000 MG IV SOLR
INTRAVENOUS | Status: AC
  Filled 2018-03-09: qty 1000

## 2018-03-09 MED ORDER — PROPOFOL 10 MG/ML IV BOLUS
INTRAVENOUS | Status: DC | PRN
  Administered 2018-03-09: 150 mg via INTRAVENOUS
  Administered 2018-03-09: 30 mg via INTRAVENOUS
  Administered 2018-03-09: 20 mg via INTRAVENOUS

## 2018-03-09 MED ORDER — ALBUTEROL SULFATE HFA 108 (90 BASE) MCG/ACT IN AERS
INHALATION_SPRAY | RESPIRATORY_TRACT | Status: DC | PRN
  Administered 2018-03-09: 6 via RESPIRATORY_TRACT

## 2018-03-09 MED ORDER — ACETAMINOPHEN 325 MG PO TABS
650.0000 mg | ORAL_TABLET | ORAL | Status: DC | PRN
  Filled 2018-03-09: qty 2

## 2018-03-09 MED ORDER — SODIUM CHLORIDE 0.9 % IV SOLN
250.0000 mL | INTRAVENOUS | Status: DC
  Administered 2018-03-09: 250 mL via INTRAVENOUS

## 2018-03-09 MED ORDER — AMLODIPINE BESYLATE 10 MG PO TABS
10.0000 mg | ORAL_TABLET | Freq: Every day | ORAL | Status: DC
  Administered 2018-03-09 – 2018-03-11 (×3): 10 mg via ORAL
  Filled 2018-03-09 (×3): qty 1

## 2018-03-09 MED ORDER — PANTOPRAZOLE SODIUM 40 MG PO TBEC
40.0000 mg | DELAYED_RELEASE_TABLET | Freq: Two times a day (BID) | ORAL | Status: DC
  Administered 2018-03-09 – 2018-03-11 (×5): 40 mg via ORAL
  Filled 2018-03-09 (×5): qty 1

## 2018-03-09 MED ORDER — VANCOMYCIN HCL 1000 MG IV SOLR
INTRAVENOUS | Status: DC | PRN
  Administered 2018-03-09: 1000 mg via TOPICAL

## 2018-03-09 MED ORDER — HYDROMORPHONE HCL 1 MG/ML IJ SOLN
INTRAMUSCULAR | Status: AC
  Administered 2018-03-09: 0.5 mg via INTRAVENOUS
  Filled 2018-03-09: qty 1

## 2018-03-09 MED ORDER — CEFAZOLIN SODIUM-DEXTROSE 2-4 GM/100ML-% IV SOLN
2.0000 g | Freq: Three times a day (TID) | INTRAVENOUS | Status: AC
  Administered 2018-03-09 (×2): 2 g via INTRAVENOUS
  Filled 2018-03-09 (×2): qty 100

## 2018-03-09 MED ORDER — CHLORHEXIDINE GLUCONATE CLOTH 2 % EX PADS: 6.0000 | MEDICATED_PAD | Freq: Once | CUTANEOUS | Status: DC

## 2018-03-09 MED ORDER — HYDROMORPHONE HCL 1 MG/ML IJ SOLN
0.2500 mg | INTRAMUSCULAR | Status: DC | PRN
  Administered 2018-03-09 (×4): 0.5 mg via INTRAVENOUS

## 2018-03-09 MED ORDER — GABAPENTIN 300 MG PO CAPS
300.0000 mg | ORAL_CAPSULE | Freq: Every day | ORAL | Status: DC
  Administered 2018-03-09 – 2018-03-10 (×2): 300 mg via ORAL
  Filled 2018-03-09 (×2): qty 1

## 2018-03-09 MED ORDER — HEPARIN SODIUM (PORCINE) 1000 UNIT/ML IJ SOLN
INTRAMUSCULAR | Status: AC
  Filled 2018-03-09: qty 1

## 2018-03-09 MED ORDER — EPHEDRINE SULFATE 50 MG/ML IJ SOLN
INTRAMUSCULAR | Status: DC | PRN
  Administered 2018-03-09: 10 mg via INTRAVENOUS

## 2018-03-09 MED ORDER — DIAZEPAM 5 MG/ML IJ SOLN
2.5000 mg | Freq: Once | INTRAMUSCULAR | Status: AC
  Administered 2018-03-09: 2.5 mg via INTRAVENOUS

## 2018-03-09 MED ORDER — ASPIRIN 81 MG PO CHEW
81.0000 mg | CHEWABLE_TABLET | Freq: Every day | ORAL | Status: DC
  Administered 2018-03-09 – 2018-03-11 (×3): 81 mg via ORAL
  Filled 2018-03-09 (×3): qty 1

## 2018-03-09 MED ORDER — INFLUENZA VAC SPLIT QUAD 0.5 ML IM SUSY
0.5000 mL | PREFILLED_SYRINGE | INTRAMUSCULAR | Status: AC
  Administered 2018-03-10: 0.5 mL via INTRAMUSCULAR
  Filled 2018-03-09: qty 0.5

## 2018-03-09 MED ORDER — NEBIVOLOL HCL 5 MG PO TABS
5.0000 mg | ORAL_TABLET | Freq: Every day | ORAL | Status: DC
  Administered 2018-03-09 – 2018-03-11 (×3): 5 mg via ORAL
  Filled 2018-03-09 (×3): qty 1

## 2018-03-09 MED ORDER — THROMBIN 5000 UNITS EX SOLR
CUTANEOUS | Status: AC
  Filled 2018-03-09: qty 15000

## 2018-03-09 MED ORDER — METFORMIN HCL 500 MG PO TABS
1000.0000 mg | ORAL_TABLET | Freq: Two times a day (BID) | ORAL | Status: DC
  Administered 2018-03-09 – 2018-03-11 (×4): 1000 mg via ORAL
  Filled 2018-03-09 (×4): qty 2

## 2018-03-09 MED ORDER — ARTIFICIAL TEARS OPHTHALMIC OINT
TOPICAL_OINTMENT | OPHTHALMIC | Status: DC | PRN
  Administered 2018-03-09: 1 via OPHTHALMIC

## 2018-03-09 MED ORDER — PHENYLEPHRINE 40 MCG/ML (10ML) SYRINGE FOR IV PUSH (FOR BLOOD PRESSURE SUPPORT)
PREFILLED_SYRINGE | INTRAVENOUS | Status: AC
  Filled 2018-03-09: qty 10

## 2018-03-09 MED ORDER — MIDAZOLAM HCL 2 MG/2ML IJ SOLN
INTRAMUSCULAR | Status: AC
  Filled 2018-03-09: qty 2

## 2018-03-09 MED ORDER — PHENYLEPHRINE HCL 10 MG/ML IJ SOLN
INTRAMUSCULAR | Status: DC | PRN
  Administered 2018-03-09: 80 ug via INTRAVENOUS
  Administered 2018-03-09: 40 ug via INTRAVENOUS
  Administered 2018-03-09: 80 ug via INTRAVENOUS
  Administered 2018-03-09: 40 ug via INTRAVENOUS
  Administered 2018-03-09 (×2): 80 ug via INTRAVENOUS

## 2018-03-09 MED ORDER — CEFAZOLIN SODIUM-DEXTROSE 2-4 GM/100ML-% IV SOLN
2.0000 g | INTRAVENOUS | Status: AC
  Administered 2018-03-09: 2 g via INTRAVENOUS
  Filled 2018-03-09: qty 100

## 2018-03-09 MED ORDER — HYDROMORPHONE HCL 1 MG/ML IJ SOLN
INTRAMUSCULAR | Status: AC
  Filled 2018-03-09: qty 1

## 2018-03-09 MED ORDER — INSULIN ASPART 100 UNIT/ML ~~LOC~~ SOLN
0.0000 [IU] | Freq: Three times a day (TID) | SUBCUTANEOUS | Status: DC
  Administered 2018-03-09: 3 [IU] via SUBCUTANEOUS
  Administered 2018-03-10 (×2): 2 [IU] via SUBCUTANEOUS
  Administered 2018-03-10: 3 [IU] via SUBCUTANEOUS

## 2018-03-09 MED ORDER — MENTHOL 3 MG MT LOZG: 1.0000 | LOZENGE | OROMUCOSAL | Status: DC | PRN

## 2018-03-09 MED ORDER — PROPOFOL 10 MG/ML IV BOLUS
INTRAVENOUS | Status: AC
  Filled 2018-03-09: qty 20

## 2018-03-09 MED ORDER — DEXAMETHASONE SODIUM PHOSPHATE 10 MG/ML IJ SOLN
10.0000 mg | INTRAMUSCULAR | Status: AC
  Administered 2018-03-09: 5 mg via INTRAVENOUS
  Filled 2018-03-09: qty 1

## 2018-03-09 MED ORDER — HYDROCHLOROTHIAZIDE 25 MG PO TABS
25.0000 mg | ORAL_TABLET | Freq: Every day | ORAL | Status: DC
  Administered 2018-03-09 – 2018-03-11 (×3): 25 mg via ORAL
  Filled 2018-03-09 (×3): qty 1

## 2018-03-09 MED ORDER — METHOCARBAMOL 1000 MG/10ML IJ SOLN
500.0000 mg | Freq: Four times a day (QID) | INTRAVENOUS | Status: DC | PRN
  Filled 2018-03-09: qty 5

## 2018-03-09 MED ORDER — LIDOCAINE 2% (20 MG/ML) 5 ML SYRINGE
INTRAMUSCULAR | Status: AC
  Filled 2018-03-09: qty 5

## 2018-03-09 MED ORDER — OXYCODONE HCL 5 MG PO TABS
10.0000 mg | ORAL_TABLET | ORAL | Status: DC | PRN
  Administered 2018-03-09 – 2018-03-11 (×10): 10 mg via ORAL
  Filled 2018-03-09 (×10): qty 2

## 2018-03-09 MED ORDER — FENTANYL CITRATE (PF) 250 MCG/5ML IJ SOLN
INTRAMUSCULAR | Status: AC
  Filled 2018-03-09: qty 5

## 2018-03-09 MED ORDER — HYDROMORPHONE HCL 1 MG/ML IJ SOLN
0.5000 mg | INTRAMUSCULAR | Status: DC | PRN
  Administered 2018-03-09 (×3): 0.5 mg via INTRAVENOUS
  Filled 2018-03-09 (×3): qty 0.5

## 2018-03-09 MED ORDER — SUGAMMADEX SODIUM 200 MG/2ML IV SOLN
INTRAVENOUS | Status: DC | PRN
  Administered 2018-03-09: 231 mg via INTRAVENOUS

## 2018-03-09 MED ORDER — ALBUTEROL SULFATE HFA 108 (90 BASE) MCG/ACT IN AERS
INHALATION_SPRAY | RESPIRATORY_TRACT | Status: AC
  Filled 2018-03-09: qty 6.7

## 2018-03-09 MED ORDER — LACTATED RINGERS IV SOLN
INTRAVENOUS | Status: DC
  Administered 2018-03-09 (×2): via INTRAVENOUS

## 2018-03-09 MED ORDER — ONDANSETRON HCL 4 MG/2ML IJ SOLN
INTRAMUSCULAR | Status: DC | PRN
  Administered 2018-03-09: 4 mg via INTRAVENOUS

## 2018-03-09 MED ORDER — HYDROMORPHONE HCL 2 MG PO TABS
ORAL_TABLET | ORAL | Status: AC
  Filled 2018-03-09: qty 1

## 2018-03-09 MED ORDER — SENNA 8.6 MG PO TABS
1.0000 | ORAL_TABLET | Freq: Two times a day (BID) | ORAL | Status: DC
  Administered 2018-03-09 – 2018-03-11 (×5): 8.6 mg via ORAL
  Filled 2018-03-09 (×5): qty 1

## 2018-03-09 MED ORDER — ROCURONIUM BROMIDE 50 MG/5ML IV SOSY
PREFILLED_SYRINGE | INTRAVENOUS | Status: AC
  Filled 2018-03-09: qty 5

## 2018-03-09 MED ORDER — THROMBIN 20000 UNITS EX SOLR
CUTANEOUS | Status: DC | PRN
  Administered 2018-03-09: 11:00:00 via TOPICAL

## 2018-03-09 MED ORDER — ONDANSETRON HCL 4 MG/2ML IJ SOLN: 4.0000 mg | Freq: Four times a day (QID) | INTRAMUSCULAR | Status: DC | PRN

## 2018-03-09 MED ORDER — THROMBIN 5000 UNITS EX SOLR
OROMUCOSAL | Status: DC | PRN
  Administered 2018-03-09: 11:00:00 via TOPICAL

## 2018-03-09 MED ORDER — VALSARTAN-HYDROCHLOROTHIAZIDE 320-25 MG PO TABS: 1.0000 | ORAL_TABLET | Freq: Every day | ORAL | Status: DC

## 2018-03-09 MED ORDER — FENTANYL CITRATE (PF) 100 MCG/2ML IJ SOLN
INTRAMUSCULAR | Status: DC | PRN
  Administered 2018-03-09: 100 ug via INTRAVENOUS
  Administered 2018-03-09 (×3): 50 ug via INTRAVENOUS

## 2018-03-09 MED ORDER — ONDANSETRON HCL 4 MG/2ML IJ SOLN
INTRAMUSCULAR | Status: AC
  Filled 2018-03-09: qty 2

## 2018-03-09 MED ORDER — SODIUM CHLORIDE 0.9 % IV SOLN
INTRAVENOUS | Status: DC | PRN
  Administered 2018-03-09: 11:00:00

## 2018-03-09 MED ORDER — EPHEDRINE 5 MG/ML INJ
INTRAVENOUS | Status: AC
  Filled 2018-03-09: qty 10

## 2018-03-09 MED ORDER — METHOCARBAMOL 500 MG PO TABS
500.0000 mg | ORAL_TABLET | Freq: Four times a day (QID) | ORAL | Status: DC | PRN
  Administered 2018-03-09 – 2018-03-11 (×5): 500 mg via ORAL
  Filled 2018-03-09 (×5): qty 1

## 2018-03-09 MED ORDER — SODIUM CHLORIDE 0.9% FLUSH
3.0000 mL | Freq: Two times a day (BID) | INTRAVENOUS | Status: DC
  Administered 2018-03-09 – 2018-03-10 (×4): 3 mL via INTRAVENOUS

## 2018-03-09 MED ORDER — BUPIVACAINE HCL (PF) 0.25 % IJ SOLN
INTRAMUSCULAR | Status: AC
  Filled 2018-03-09: qty 30

## 2018-03-09 MED ORDER — MIDAZOLAM HCL 5 MG/5ML IJ SOLN
INTRAMUSCULAR | Status: DC | PRN
  Administered 2018-03-09: 2 mg via INTRAVENOUS

## 2018-03-09 MED ORDER — ALUM & MAG HYDROXIDE-SIMETH 200-200-20 MG/5ML PO SUSP: 30.0000 mL | Freq: Four times a day (QID) | ORAL | Status: DC | PRN

## 2018-03-09 MED ORDER — HYDROMORPHONE HCL 1 MG/ML IJ SOLN
INTRAMUSCULAR | Status: AC
  Filled 2018-03-09: qty 0.5

## 2018-03-09 MED ORDER — NALOXEGOL OXALATE 25 MG PO TABS
25.0000 mg | ORAL_TABLET | Freq: Every day | ORAL | Status: DC
  Administered 2018-03-09 – 2018-03-11 (×3): 25 mg via ORAL
  Filled 2018-03-09 (×3): qty 1

## 2018-03-09 MED ORDER — ONDANSETRON HCL 4 MG PO TABS: 4.0000 mg | ORAL_TABLET | Freq: Four times a day (QID) | ORAL | Status: DC | PRN

## 2018-03-09 MED ORDER — PHENOL 1.4 % MT LIQD: 1.0000 | OROMUCOSAL | Status: DC | PRN

## 2018-03-09 MED ORDER — BUPIVACAINE HCL (PF) 0.25 % IJ SOLN
INTRAMUSCULAR | Status: DC | PRN
  Administered 2018-03-09: 5 mL

## 2018-03-09 MED ORDER — IRBESARTAN 300 MG PO TABS
300.0000 mg | ORAL_TABLET | Freq: Every day | ORAL | Status: DC
  Administered 2018-03-09 – 2018-03-11 (×3): 300 mg via ORAL
  Filled 2018-03-09 (×3): qty 1

## 2018-03-09 MED ORDER — ARTIFICIAL TEARS OPHTHALMIC OINT
TOPICAL_OINTMENT | OPHTHALMIC | Status: AC
  Filled 2018-03-09: qty 3.5

## 2018-03-09 MED ORDER — HYDROMORPHONE HCL 1 MG/ML IJ SOLN
0.2500 mg | INTRAMUSCULAR | Status: DC | PRN
  Administered 2018-03-09 (×2): 0.5 mg via INTRAVENOUS

## 2018-03-09 MED ORDER — THROMBIN (RECOMBINANT) 20000 UNITS EX SOLR
CUTANEOUS | Status: AC
  Filled 2018-03-09: qty 20000

## 2018-03-09 MED ORDER — 0.9 % SODIUM CHLORIDE (POUR BTL) OPTIME
TOPICAL | Status: DC | PRN
  Administered 2018-03-09: 1000 mL

## 2018-03-09 MED ORDER — DIAZEPAM 5 MG/ML IJ SOLN
INTRAMUSCULAR | Status: AC
  Administered 2018-03-09: 2.5 mg via INTRAVENOUS
  Filled 2018-03-09: qty 2

## 2018-03-09 MED ORDER — DEXAMETHASONE SODIUM PHOSPHATE 10 MG/ML IJ SOLN
INTRAMUSCULAR | Status: AC
  Filled 2018-03-09: qty 1

## 2018-03-09 SURGICAL SUPPLY — 63 items
ADH SKN CLS APL DERMABOND .7 (GAUZE/BANDAGES/DRESSINGS) ×1
APL SKNCLS STERI-STRIP NONHPOA (GAUZE/BANDAGES/DRESSINGS) ×1
BAG DECANTER FOR FLEXI CONT (MISCELLANEOUS) ×3 IMPLANT
BASKET BONE COLLECTION (BASKET) ×3 IMPLANT
BENZOIN TINCTURE PRP APPL 2/3 (GAUZE/BANDAGES/DRESSINGS) ×3 IMPLANT
BLADE CLIPPER SURG (BLADE) IMPLANT
BUR MATCHSTICK NEURO 3.0 LAGG (BURR) ×3 IMPLANT
CANISTER SUCT 3000ML PPV (MISCELLANEOUS) ×3 IMPLANT
CARTRIDGE OIL MAESTRO DRILL (MISCELLANEOUS) ×1 IMPLANT
CLOSURE WOUND 1/2 X4 (GAUZE/BANDAGES/DRESSINGS) ×1
CONT SPEC 4OZ CLIKSEAL STRL BL (MISCELLANEOUS) ×3 IMPLANT
COVER BACK TABLE 60X90IN (DRAPES) ×3 IMPLANT
DERMABOND ADVANCED (GAUZE/BANDAGES/DRESSINGS) ×2
DERMABOND ADVANCED .7 DNX12 (GAUZE/BANDAGES/DRESSINGS) ×1 IMPLANT
DIFFUSER DRILL AIR PNEUMATIC (MISCELLANEOUS) ×3 IMPLANT
DRAPE C-ARM 42X72 X-RAY (DRAPES) ×3 IMPLANT
DRAPE C-ARMOR (DRAPES) ×3 IMPLANT
DRAPE LAPAROTOMY 100X72X124 (DRAPES) ×3 IMPLANT
DRAPE POUCH INSTRU U-SHP 10X18 (DRAPES) ×3 IMPLANT
DRAPE SURG 17X23 STRL (DRAPES) ×3 IMPLANT
DRSG OPSITE POSTOP 4X6 (GAUZE/BANDAGES/DRESSINGS) ×2 IMPLANT
DURAPREP 26ML APPLICATOR (WOUND CARE) ×3 IMPLANT
ELECT REM PT RETURN 9FT ADLT (ELECTROSURGICAL) ×3
ELECTRODE REM PT RTRN 9FT ADLT (ELECTROSURGICAL) ×1 IMPLANT
EVACUATOR 1/8 PVC DRAIN (DRAIN) ×3 IMPLANT
GAUZE 4X4 16PLY RFD (DISPOSABLE) IMPLANT
GLOVE BIO SURGEON STRL SZ7 (GLOVE) IMPLANT
GLOVE BIO SURGEON STRL SZ8 (GLOVE) ×6 IMPLANT
GLOVE BIOGEL PI IND STRL 7.0 (GLOVE) IMPLANT
GLOVE BIOGEL PI INDICATOR 7.0 (GLOVE)
GOWN STRL REUS W/ TWL LRG LVL3 (GOWN DISPOSABLE) IMPLANT
GOWN STRL REUS W/ TWL XL LVL3 (GOWN DISPOSABLE) ×2 IMPLANT
GOWN STRL REUS W/TWL 2XL LVL3 (GOWN DISPOSABLE) IMPLANT
GOWN STRL REUS W/TWL LRG LVL3 (GOWN DISPOSABLE)
GOWN STRL REUS W/TWL XL LVL3 (GOWN DISPOSABLE) ×6
HEMOSTAT POWDER KIT SURGIFOAM (HEMOSTASIS) IMPLANT
KIT BASIN OR (CUSTOM PROCEDURE TRAY) ×3 IMPLANT
KIT BONE MRW ASP ANGEL CPRP (KITS) ×2 IMPLANT
KIT TURNOVER KIT B (KITS) ×3 IMPLANT
MILL MEDIUM DISP (BLADE) IMPLANT
NDL HYPO 25X1 1.5 SAFETY (NEEDLE) ×1 IMPLANT
NEEDLE HYPO 25X1 1.5 SAFETY (NEEDLE) ×3 IMPLANT
NS IRRIG 1000ML POUR BTL (IV SOLUTION) ×3 IMPLANT
OIL CARTRIDGE MAESTRO DRILL (MISCELLANEOUS) ×3
PACK LAMINECTOMY NEURO (CUSTOM PROCEDURE TRAY) ×3 IMPLANT
PAD ARMBOARD 7.5X6 YLW CONV (MISCELLANEOUS) ×9 IMPLANT
PUTTY DBM ALLOSYNC PURE 10CC (Putty) ×2 IMPLANT
ROD PC 5.5X40 TI ARSENAL (Rod) ×4 IMPLANT
SCREW CBX 5.5X45 (Screw) ×8 IMPLANT
SCREW SET SPINAL ARSENAL 47127 (Screw) ×8 IMPLANT
SPACER IDENTITI PS 11X9X30 10D (Spacer) ×4 IMPLANT
SPONGE LAP 4X18 RFD (DISPOSABLE) IMPLANT
SPONGE SURGIFOAM ABS GEL 100 (HEMOSTASIS) ×3 IMPLANT
STRIP CLOSURE SKIN 1/2X4 (GAUZE/BANDAGES/DRESSINGS) ×3 IMPLANT
SUT VIC AB 0 CT1 18XCR BRD8 (SUTURE) ×1 IMPLANT
SUT VIC AB 0 CT1 8-18 (SUTURE) ×3
SUT VIC AB 2-0 CP2 18 (SUTURE) ×3 IMPLANT
SUT VIC AB 3-0 SH 8-18 (SUTURE) ×6 IMPLANT
SYR CONTROL 10ML LL (SYRINGE) ×3 IMPLANT
TOWEL GREEN STERILE (TOWEL DISPOSABLE) ×3 IMPLANT
TOWEL GREEN STERILE FF (TOWEL DISPOSABLE) ×3 IMPLANT
TRAY FOLEY MTR SLVR 16FR STAT (SET/KITS/TRAYS/PACK) ×3 IMPLANT
WATER STERILE IRR 1000ML POUR (IV SOLUTION) ×3 IMPLANT

## 2018-03-09 NOTE — Transfer of Care (Signed)
Immediate Anesthesia Transfer of Care Note  Patient: James Lee  Procedure(s) Performed: PLIF - L3-L4 (N/A Back)  Patient Location: PACU  Anesthesia Type:General  Level of Consciousness: awake, alert , oriented and sedated  Airway & Oxygen Therapy: Patient Spontanous Breathing and Patient connected to face mask oxygen  Post-op Assessment: Report given to RN, Post -op Vital signs reviewed and stable and Patient moving all extremities  Post vital signs: Reviewed and stable  Last Vitals:  Vitals Value Taken Time  BP 127/63 03/09/2018  1:45 PM  Temp 36.5 C 03/09/2018  1:45 PM  Pulse 80 03/09/2018  1:47 PM  Resp 10 03/09/2018  1:47 PM  SpO2 96 % 03/09/2018  1:47 PM  Vitals shown include unvalidated device data.  Last Pain:  Vitals:   03/09/18 0830  TempSrc: Oral  PainSc:       Patients Stated Pain Goal: 3 (94/58/59 2924)  Complications: No apparent anesthesia complications

## 2018-03-09 NOTE — Op Note (Signed)
03/09/2018  1:38 PM  PATIENT:  James Lee  67 y.o. male  PRE-OPERATIVE DIAGNOSIS: post-Laminectomy instability L3-4 with severe spinal stenosis, back and leg pain  POST-OPERATIVE DIAGNOSIS:  same  PROCEDURE:   1. Decompressive lumbar laminectomy L3-4 requiring more work than would be required for a simple exposure of the disk for PLIF in order to adequately decompress the neural elements and address the spinal stenosis 2. Posterior lumbar interbody fusion L3-4 using porous titanium interbody cages packed with morcellized allograft and autograft soaked with a bone marrow aspirate obtained through a separate fascial incision over the right iliac crest 3. Posterior fixation L3-4 using Alphatec cortical pedicle screws.  4. Intertransverse arthrodesis L3-4 using morcellized autograft and allograft.  SURGEON:  Sherley Bounds, MD  ASSISTANTS: Glenford Peers FNP  ANESTHESIA:  General  EBL: 300 ml  Total I/O In: 1100 [I.V.:1100] Out: 335 [Urine:35; Blood:300]  BLOOD ADMINISTERED:none  DRAINS: none   INDICATION FOR PROCEDURE: This patient presented with your back and left leg pain in an L3 distribution. Imaging revealed the level spondylosis with disc herniations and spinal stenosis with worse with certainly at M0-9 where he had some diastases of the facets and severe spinal stenosis and previous surgery.  Originally I planned on a 3 level decompression and fusion but after speaking to partners of mine and looking at his films over and over and considering my options I decided that the best option for him was likely just an L3-4 fusion to address the worst of the spinal stenosis.  Had a long discussion with him before surgery and we agreed that we felt this was a safe and reasonable plan. the patient tried a reasonable attempt at conservative medical measures without relief. I recommended decompression and instrumented fusion to address the stenosis as well as the segmental  instability.  Patient  understood the risks, benefits, and alternatives and potential outcomes and wished to proceed.  PROCEDURE DETAILS:  The patient was brought to the operating room. After induction of generalized endotracheal anesthesia the patient was rolled into the prone position on chest rolls and all pressure points were padded. The patient's lumbar region was cleaned and then prepped with DuraPrep and draped in the usual sterile fashion. Anesthesia was injected and then a dorsal midline incision was made and carried down to the lumbosacral fascia. The fascia was opened and the paraspinous musculature was taken down in a subperiosteal fashion to expose L3-4. A self-retaining retractor was placed. Intraoperative fluoroscopy confirmed my level, and I started with placement of the L3 cortical pedicle screws. The pedicle screw entry zones were identified utilizing surface landmarks and  AP and lateral fluoroscopy. I scored the cortex with the high-speed drill and then used the hand drill to drill an upward and outward direction into the pedicle. I then tapped line to line. I then placed a 5.5 x 45 mm cortical pedicle screw into the pedicles of L3 bilaterally. I then turned my attention to the decompression and complete lumbar laminectomies, hemi- facetectomies, and foraminotomies were performed at L3-4. The patient had significant spinal stenosis and this required more work than would be required for a simple exposure of the disc for posterior lumbar interbody fusion which would only require a limited laminotomy. Much more generous decompression and generous foraminotomy was undertaken in order to adequately decompress the neural elements and address the patient's leg pain. The yellow ligament was removed to expose the underlying dura and nerve roots, and generous foraminotomies were performed to adequately decompress the  neural elements. Both the exiting and traversing nerve roots were decompressed on both sides until a coronary  dilator passed easily along the nerve roots. Once the decompression was complete, I turned my attention to the posterior lower lumbar interbody fusion. The epidural venous vasculature was coagulated and cut sharply. Disc space was incised and the initial discectomy was performed with pituitary rongeurs. The disc space was distracted with sequential distractors to a height of 11 mm. We then used a series of scrapers and shavers to prepare the endplates for fusion. The midline was prepared with Epstein curettes. Once the complete discectomy was finished, we packed an appropriate sized interbody cage with local autograft and morcellized allograft, gently retracted the nerve root, and tapped the cage into position at L3-4.  The midline between the cages was packed with morselized autograft and allograft. We then turned our attention to the placement of the lower pedicle screws. The pedicle screw entry zones were identified utilizing surface landmarks and fluoroscopy. I drilled into each pedicle utilizing the hand drill, and tapped each pedicle with the appropriate tap. We palpated with a ball probe to assure no break in the cortex. We then placed 5.5 x 45 mm pedicle screws into the pedicles bilaterally at L4. We then decorticated the transverse processes and laid a mixture of morcellized autograft and allograft out over these to perform intertransverse arthrodesis at L3-4. We then placed lordotic rods into the multiaxial screw heads of the pedicle screws and locked these in position with the locking caps and anti-torque device. We then checked our construct with AP and lateral fluoroscopy. Irrigated with copious amounts of bacitracin-containing saline solution. Inspected the nerve roots once again to assure adequate decompression, lined to the dura with Gelfoam, placed powdered vancomycin into the wound, and closed the muscle and the fascia with 0 Vicryl. Closed the subcutaneous tissues with 2-0 Vicryl and subcuticular  tissues with 3-0 Vicryl. The skin was closed with benzoin and Steri-Strips. Dressing was then applied, the patient was awakened from general anesthesia and transported to the recovery room in stable condition. At the end of the procedure all sponge, needle and instrument counts were correct.   PLAN OF CARE: admit to inpatient  PATIENT DISPOSITION:  PACU - hemodynamically stable.   Delay start of Pharmacological VTE agent (>24hrs) due to surgical blood loss or risk of bleeding:  yes

## 2018-03-09 NOTE — Anesthesia Preprocedure Evaluation (Addendum)
Anesthesia Evaluation  Patient identified by MRN, date of birth, ID band Patient awake    Reviewed: Allergy & Precautions, NPO status , Patient's Chart, lab work & pertinent test results, reviewed documented beta blocker date and time   Airway Mallampati: II  TM Distance: >3 FB Neck ROM: Full    Dental no notable dental hx. (+) Missing,    Pulmonary sleep apnea and Continuous Positive Airway Pressure Ventilation , Current Smoker,    Pulmonary exam normal breath sounds clear to auscultation       Cardiovascular hypertension, Pt. on medications and Pt. on home beta blockers Normal cardiovascular exam Rhythm:Regular Rate:Normal     Neuro/Psych negative neurological ROS  negative psych ROS   GI/Hepatic Neg liver ROS, GERD  Medicated,  Endo/Other  diabetes, Type 2, Oral Hypoglycemic Agents  Renal/GU negative Renal ROS  negative genitourinary   Musculoskeletal  (+) Arthritis , Osteoarthritis,    Abdominal   Peds  Hematology negative hematology ROS (+)   Anesthesia Other Findings Cervical spinal surgery  Reproductive/Obstetrics                          Anesthesia Physical Anesthesia Plan  ASA: III  Anesthesia Plan: General   Post-op Pain Management:    Induction: Intravenous  PONV Risk Score and Plan: 1 and Dexamethasone, Ondansetron and Midazolam  Airway Management Planned: Oral ETT and Video Laryngoscope Planned  Additional Equipment:   Intra-op Plan:   Post-operative Plan: Extubation in OR  Informed Consent: I have reviewed the patients History and Physical, chart, labs and discussed the procedure including the risks, benefits and alternatives for the proposed anesthesia with the patient or authorized representative who has indicated his/her understanding and acceptance.   Dental advisory given  Plan Discussed with: CRNA  Anesthesia Plan Comments:        Anesthesia Quick  Evaluation

## 2018-03-09 NOTE — Anesthesia Procedure Notes (Signed)
Procedure Name: Intubation Date/Time: 03/09/2018 10:35 AM Performed by: Scheryl Darter, CRNA Pre-anesthesia Checklist: Patient identified, Emergency Drugs available, Suction available and Patient being monitored Patient Re-evaluated:Patient Re-evaluated prior to induction Oxygen Delivery Method: Circle System Utilized Preoxygenation: Pre-oxygenation with 100% oxygen Induction Type: IV induction Ventilation: Mask ventilation without difficulty Laryngoscope Size: Glidescope Grade View: Grade I Tube type: Oral Tube size: 7.5 mm Number of attempts: 1 Airway Equipment and Method: Stylet and Oral airway Placement Confirmation: ETT inserted through vocal cords under direct vision,  positive ETCO2 and breath sounds checked- equal and bilateral Secured at: 23 cm Tube secured with: Tape Dental Injury: Teeth and Oropharynx as per pre-operative assessment  Comments: Intubated by [facility]

## 2018-03-09 NOTE — H&P (Signed)
Subjective: Patient is a 67 y.o. male admitted for PLIF. Onset of symptoms was several months ago, rapidly worsening since that time.  The pain is rated intense, unremitting, and is located at the across the lower back and radiates to LLE. The pain is described as aching, stabbing and throbbing and occurs all day. The symptoms have been progressive. Symptoms are exacerbated by exercise, flexion and standing. MRI or CT showed severe stenosis   Past Medical History:  Diagnosis Date  . Colon polyps   . Diabetes mellitus without complication ([place])   . Dyslipidemia   . GERD (gastroesophageal reflux disease)   . Hyperlipidemia   . Hypertension   . LBP (low back pain)   . Prostate cancer [facility]) 2014   prostate   . Sleep apnea    c-pap wears    Past Surgical History:  Procedure Laterality Date  . ABDOMINAL SURGERY     gastric ulcer  . back sugery     1996, 2017, 2018  . [place]   2017, 2018, 2019  . gastric ulcers     surgery 11-28-17  . INGUINAL HERNIA REPAIR     right  . LAPAROSCOPY N/A 11/28/2017   Procedure: LAPAROSCOPY DIAGNOSTIC converted to open;  Surgeon: Ralene Ok, MD;  Location: [place];  Service: General;  Laterality: N/A;  . LAPAROTOMY N/A 11/28/2017   Procedure: EXPLORATORY LAPAROTOMY;  Surgeon: Ralene Ok, MD;  Location: [place];  Service: General;  Laterality: N/A;  . MENISCUS REPAIR Right   . POSTERIOR CERVICAL FUSION/FORAMINOTOMY N/A 11/10/2017   Procedure: Posterior cervical fusion with lateral mass fixation Cervical Three-Seven, cervical laminectomy Cervical Three- Seven;  Surgeon: Eustace Moore, MD;  Location: [place];  Service: Neurosurgery;  Laterality: N/A;  Posterior cervical fusion with lateral mass fixation Cervical Three-Seven, cervical laminectomy Cervical Three- Seven  . PROSTATECTOMY  01/2012  . TONSILLECTOMY    . TOOTH EXTRACTION      Prior to Admission medications   Medication Sig Start Date End Date Taking? Authorizing Provider   ACCU-CHEK AVIVA PLUS test strip CHECK BLOOD SUGAR IN THE AM BEFORE BREAKFAST AND IN THER EVENING AS NEEDED E11.9 01/03/18  Yes [provider]  amLODipine (NORVASC) 10 MG tablet Take 10 mg by mouth daily.   Yes [provider]  aspirin 81 MG tablet Take 81 mg by mouth daily.   Yes [provider]  gabapentin (NEURONTIN) 300 MG capsule Take 300 mg by mouth at bedtime.  10/08/15  Yes [provider]  metFORMIN (GLUCOPHAGE) 500 MG tablet Take 1,000 mg by mouth 2 (two) times daily.    Yes [provider]  Multiple Vitamins-Minerals (MULTIVITAMIN WITH MINERALS) tablet Take 1 tablet by mouth daily.   Yes [provider]  nebivolol (BYSTOLIC) 5 MG tablet Take 5 mg by mouth daily.  09/02/17  Yes [provider]  pantoprazole (PROTONIX) 40 MG tablet Take 1 tablet (40 mg total) by mouth 2 (two) times daily. Take for a full 30 days 01/26/18  Yes Nandigam, Venia Minks, MD  simvastatin (ZOCOR) 40 MG tablet Take 40 mg by mouth daily.    Yes [provider]  valsartan-hydrochlorothiazide (DIOVAN-HCT) 320-25 MG tablet Take 1 tablet by mouth daily. 10/19/15  Yes [provider]  bismuth-metronidazole-tetracycline (PYLERA) 140-125-125 MG capsule Take 3 capsules by mouth 4 (four) times daily -  before meals and at bedtime. Patient not taking: Reported on 02/28/2018 01/26/18   Mauri Pole, MD  Syringe/Needle, Disp, (SYRINGE  3CC/22GX1-1/2") 22G X 1-1/2" 3 ML MISC Use for B-12 injections. 11/06/12   [provider]   No Known Allergies  Social History   Tobacco Use  . Smoking status: Current Every Day Smoker    Packs/day: 1.00    Years: 40.00    Pack years: 40.00    Types: Cigarettes  . Smokeless tobacco: Never Used  Substance Use Topics  . Alcohol use: Yes    Alcohol/week: 0.0 standard drinks    Comment: rarely    Family History  Problem Relation Age of Onset  . Throat cancer Father   . Cancer Father   . Esophageal  cancer Father        ?21 dx - died 45  . Coronary artery disease Mother   . Prostate cancer Brother   . Colon cancer Neg Hx   . Rectal cancer Neg Hx   . Stomach cancer Neg Hx      Review of Systems  Positive ROS: neg  All other systems have been reviewed and were otherwise negative with the exception of those mentioned in the HPI and as above.  Objective: Vital signs in last 24 hours: Temp:  [98.7 F (37.1 C)] 98.7 F (37.1 C) (09/27 0830) Pulse Rate:  [72] 72 (09/27 0830) Resp:  [18] 18 (09/27 0830) BP: (148)/(83) 148/83 (09/27 0830) SpO2:  [98 %] 98 % (09/27 0830) Weight:  [115.5 kg] 115.5 kg (09/27 0824)  General Appearance: Alert, cooperative, no distress, appears stated age Head: Normocephalic, without obvious abnormality, atraumatic Eyes: PERRL, conjunctiva/corneas clear, EOM's intact    Neck: Supple, symmetrical, trachea midline Back: Symmetric, no curvature, ROM normal, no CVA tenderness Lungs:  respirations unlabored Heart: Regular rate and rhythm Abdomen: Soft, non-tender Extremities: Extremities normal, atraumatic, no cyanosis or edema Pulses: 2+ and symmetric all extremities Skin: Skin color, texture, turgor normal, no rashes or lesions  NEUROLOGIC:   Mental status: Alert and oriented x4,  no aphasia, good attention span, fund of knowledge, and memory Motor Exam - grossly normal Sensory Exam - grossly normal Reflexes: 3+ Coordination - grossly normal Gait - not tested Balance - grossly normal Cranial Nerves: I: smell Not tested  II: visual acuity  OS: nl    OD: nl  II: visual fields Full to confrontation  II: pupils Equal, round, reactive to light  III,VII: ptosis None  III,IV,VI: extraocular muscles  Full ROM  V: mastication Normal  V: facial light touch sensation  Normal  V,VII: corneal reflex  Present  VII: facial muscle function - upper  Normal  VII: facial muscle function - lower Normal  VIII: hearing Not tested  IX: soft palate elevation   Normal  IX,X: gag reflex Present  XI: trapezius strength  5/5  XI: sternocleidomastoid strength 5/5  XI: neck flexion strength  5/5  XII: tongue strength  Normal    Data Review Lab Results  Component Value Date   WBC 9.9 03/06/2018   HGB 15.0 03/06/2018   HCT 47.3 03/06/2018   MCV 92.0 03/06/2018   PLT 258 03/06/2018   Lab Results  Component Value Date   NA 141 03/06/2018   K 4.6 03/06/2018   CL 102 03/06/2018   CO2 29 03/06/2018   BUN 11 03/06/2018   CREATININE 1.09 03/06/2018   GLUCOSE 116 (H) 03/06/2018   Lab Results  Component Value Date   INR 0.88 03/06/2018    Assessment/Plan:  Estimated body mass index is 35.52 kg/m as calculated from the following:  Height as of this encounter: 5\' 11"  (1.803 m).   Weight as of this encounter: 115.5 kg. Patient admitted for PLIF. Patient has failed a reasonable attempt at conservative therapy.  I explained the condition and procedure to the patient and answered any questions.  Patient wishes to proceed with procedure as planned. Understands risks/ benefits and typical outcomes of procedure.   Kou Gucciardo S 03/09/2018 9:51 AM

## 2018-03-10 LAB — GLUCOSE, CAPILLARY
GLUCOSE-CAPILLARY: 169 mg/dL — AB (ref 70–99)
Glucose-Capillary: 183 mg/dL — ABNORMAL HIGH (ref 70–99)
Glucose-Capillary: 130 mg/dL — ABNORMAL HIGH (ref 70–99)
Glucose-Capillary: 170 mg/dL — ABNORMAL HIGH (ref 70–99)

## 2018-03-10 NOTE — Progress Notes (Signed)
Neurosurgery Progress Note  No issues overnight. Complains of moderate back soreness Was up ambulating with minimal difficulties Tolerating po Voiding normal Does not feel comfortable going home just yet  EXAM:  BP (!) 148/73 (BP Location: Right Arm)   Pulse 72   Temp 98.2 F (36.8 C) (Oral)   Resp 20   Ht 5\' 11"  (1.803 m)   Wt 115.5 kg   SpO2 95%   BMI 35.52 kg/m   Awake, alert, oriented  Speech fluent, appropriate  MAEW with symmetric strength Incision c/d/i  PLAN Doing well this am Work with therapy today Hopeful for d/c tomorrow

## 2018-03-10 NOTE — Evaluation (Signed)
Physical Therapy Evaluation and Discharge Patient Details Name: James Lee MRN: [id number] DOB: 03-25-1951 Today's Date: 03/10/2018   History of Present Illness  67 y.o. male Hx of cervical spine fusion surgery with Dr. Ronnald Ramp (11/10/17), and other significant PMH of R knee meniscus repair, lower back surgery, HTN, and DM. Currently admitted s/p laminectomy L3-4 with PLIF.  Clinical Impression  Patient evaluated by Physical Therapy with no further acute PT needs identified. All education has been completed and the patient has no further questions. Patient familiar with precautions from previous spinal surgeries. Cautious with gait and mobility. A bit weak getting up from seated position and reliant on cane for gait. Recommended to patient and wife that he may benefit from using his RW for a short period of time for safety. May benefit from OPPT when deemed appropriate by surgeon. See below for any follow-up Physical Therapy or equipment needs. PT is signing off. Thank you for this referral.     Follow Up Recommendations No PT follow up    Equipment Recommendations  None recommended by PT    Recommendations for Other Services       Precautions / Restrictions Precautions Precautions: Back Precaution Comments: Reviewed, verbalized understanding Required Braces or Orthoses: Spinal Brace Spinal Brace: Lumbar corset;Applied in sitting position Restrictions Weight Bearing Restrictions: No      Mobility  Bed Mobility Overal bed mobility: Modified Independent             General bed mobility comments: Extra time, performed log roll  Transfers Overall transfer level: Needs assistance Equipment used: None Transfers: Sit to/from Stand Sit to Stand: Min assist         General transfer comment: Min assist to rise from bed. Good stabiliy once upright.  Ambulation/Gait Ambulation/Gait assistance: Min guard Gait Distance (Feet): 300 Feet Assistive device: Straight cane Gait  Pattern/deviations: Step-through pattern;Decreased stride length;Staggering right Gait velocity: decreased   General Gait Details: Mild trendelenburg with gait, using SPC appropriately. Min guard for safety. 2 instances of staggering mildly towards the right but able to appropriately use cane to self correct. Cues for awareness and technique.  Stairs            Wheelchair Mobility    Modified Rankin (Stroke Patients Only)       Balance Overall balance assessment: Mild deficits observed, not formally tested                                           Pertinent Vitals/Pain Pain Assessment: Faces Faces Pain Scale: Hurts little more Pain Location: Back Pain Descriptors / Indicators: Aching;Operative site guarding Pain Intervention(s): Monitored during session;Repositioned    Home Living Family/patient expects to be discharged to:: Private residence Living Arrangements: Spouse/significant other Available Help at Discharge: Family;Available PRN/intermittently Type of Home: House Home Access: Level entry     Home Layout: One level Home Equipment: Walker - 2 wheels;Cane - single point;Shower seat - built in      Prior Function Level of Independence: Independent with assistive device(s)         Comments: Pt was using cane after his neck surgery, he is on disability and does not work currently.      Hand Dominance   Dominant Hand: Right    Extremity/Trunk Assessment   Upper Extremity Assessment Upper Extremity Assessment: Defer to OT evaluation    Lower Extremity Assessment Lower  Extremity Assessment: Overall WFL for tasks assessed       Communication   Communication: No difficulties  Cognition Arousal/Alertness: Awake/alert Behavior During Therapy: WFL for tasks assessed/performed Overall Cognitive Status: Within Functional Limits for tasks assessed                                        General Comments General  comments (skin integrity, edema, etc.): Pt with hx of several spinal surgeries, verbalized and demonstrated appropriate lumbar precautions and corset use.    Exercises     Assessment/Plan    PT Assessment Patent does not need any further PT services  PT Problem List         PT Treatment Interventions      PT Goals (Current goals can be found in the Care Plan section)  Acute Rehab PT Goals Patient Stated Goal: Feel better soon PT Goal Formulation: All assessment and education complete, DC therapy    Frequency     Barriers to discharge        Co-evaluation               AM-PAC PT "6 Clicks" Daily Activity  Outcome Measure Difficulty turning over in bed (including adjusting bedclothes, sheets and blankets)?: A Little Difficulty moving from lying on back to sitting on the side of the bed? : None Difficulty sitting down on and standing up from a chair with arms (e.g., wheelchair, bedside commode, etc,.)?: A Little Help needed moving to and from a bed to chair (including a wheelchair)?: A Little Help needed walking in hospital room?: None Help needed climbing 3-5 steps with a railing? : None 6 Click Score: 21    End of Session Equipment Utilized During Treatment: Back brace Activity Tolerance: Patient tolerated treatment well Patient left: in bed;with call bell/phone within reach;with family/visitor present   PT Visit Diagnosis: Other abnormalities of gait and mobility (R26.89);Pain Pain - part of body: (back)    Time: 8115-7262 PT Time Calculation (min) (ACUTE ONLY): 13 min   Charges:   PT Evaluation $PT Eval Moderate Complexity: 1 Mod          James Lafalce Rupert Stacks, [place], DPT  Ellouise Newer 03/10/2018, 10:00 AM

## 2018-03-10 NOTE — Progress Notes (Signed)
Ambulated in the hallway with a cane and brace. Tolerated well.

## 2018-03-10 NOTE — Evaluation (Signed)
Occupational Therapy Evaluation and Discharge Patient Details Name: James Lee MRN: [id number] DOB: 03/08/1951 Today's Date: 03/10/2018    History of Present Illness 67 y.o. male Hx of cervical spine fusion surgery with Dr. Ronnald Ramp (11/10/17), and other significant PMH of R knee meniscus repair, lower back surgery, HTN, and DM. Currently admitted s/p laminectomy L3-4 with PLIF.   Clinical Impression   PTA, pt was living alone and was independent. Currently, pt performing ADLs and functional mobility using a cane with supervision. Provided education on precautions, brace management, LB ADLs, toileting, and shower transfer; pt demonstrated and verbalized understanding. Answered all pt questions. Recommend dc home once medically stable per physician. All acute OT needs met and will sign off. Thank you.     Follow Up Recommendations  No OT follow up;Supervision/Assistance - 24 hour    Equipment Recommendations  None recommended by OT    Recommendations for Other Services PT consult     Precautions / Restrictions Precautions Precautions: Back Precaution Comments: Reviewed, verbalized understanding Required Braces or Orthoses: Spinal Brace Spinal Brace: Lumbar corset;Applied in sitting position Restrictions Weight Bearing Restrictions: No      Mobility Bed Mobility Overal bed mobility: Modified Independent             General bed mobility comments: Extra time, performed log roll  Transfers Overall transfer level: Needs assistance Equipment used: None Transfers: Sit to/from Stand Sit to Stand: Min guard         General transfer comment: Min Guard A for safety. Educating pt on technique to place hands on knees instead of pushing up on cane. Pt demonstrating understanding and reports this technique is easier.    Balance Overall balance assessment: Mild deficits observed, not formally tested                                         ADL either performed  or assessed with clinical judgement   ADL Overall ADL's : Needs assistance/impaired                                       General ADL Comments: Pt performing ADLs and functional mobility at supervision level. Providing education on bed mobility, brace management, LB ADLs, grooming, and functional transfer. Pt demonstrating understanding.      Vision         Perception     Praxis      Pertinent Vitals/Pain Pain Assessment: Faces Faces Pain Scale: Hurts little more Pain Location: Back Pain Descriptors / Indicators: Aching;Operative site guarding Pain Intervention(s): Monitored during session;Limited activity within patient's tolerance;Repositioned     Hand Dominance Right   Extremity/Trunk Assessment Upper Extremity Assessment Upper Extremity Assessment: Overall WFL for tasks assessed   Lower Extremity Assessment Lower Extremity Assessment: Defer to PT evaluation   Cervical / Trunk Assessment Cervical / Trunk Assessment: Other exceptions Cervical / Trunk Exceptions: s/p lumbar sx   Communication Communication Communication: No difficulties   Cognition Arousal/Alertness: Awake/alert Behavior During Therapy: WFL for tasks assessed/performed Overall Cognitive Status: Within Functional Limits for tasks assessed                                     General Comments  Pt with hx of  several spinal surgeries, verbalized and demonstrated appropriate lumbar precautions and corset use.    Exercises     Shoulder Instructions      Home Living Family/patient expects to be discharged to:: Private residence Living Arrangements: Spouse/significant other Available Help at Discharge: Family;Available PRN/intermittently Type of Home: House Home Access: Level entry     Home Layout: One level     Bathroom Shower/Tub: Occupational psychologist: Handicapped height     Home Equipment: Environmental consultant - 2 wheels;Cane - single point;Shower seat -  built in          Prior Functioning/Environment Level of Independence: Independent with assistive device(s)        Comments: Pt was using cane after his neck surgery, he is on disability and does not work currently.         OT Problem List: Decreased range of motion;Decreased activity tolerance;Impaired balance (sitting and/or standing);Decreased knowledge of use of DME or AE;Decreased knowledge of precautions;Pain      OT Treatment/Interventions:      OT Goals(Current goals can be found in the care plan section) Acute Rehab OT Goals Patient Stated Goal: Feel better soon OT Goal Formulation: All assessment and education complete, DC therapy  OT Frequency:     Barriers to D/C:            Co-evaluation              AM-PAC PT "6 Clicks" Daily Activity     Outcome Measure Help from another person eating meals?: None Help from another person taking care of personal grooming?: None Help from another person toileting, which includes using toliet, bedpan, or urinal?: A Little Help from another person bathing (including washing, rinsing, drying)?: A Little Help from another person to put on and taking off regular upper body clothing?: None Help from another person to put on and taking off regular lower body clothing?: A Little 6 Click Score: 21   End of Session Equipment Utilized During Treatment: Back brace;Other (comment)(Cane) Nurse Communication: Mobility status;Precautions  Activity Tolerance: Patient tolerated treatment well Patient left: in bed;with call bell/phone within reach;with nursing/sitter in room  OT Visit Diagnosis: Unsteadiness on feet (R26.81);Other abnormalities of gait and mobility (R26.89);Muscle weakness (generalized) (M62.81);Pain Pain - part of body: (Back)                Time: 1950-9326 OT Time Calculation (min): 15 min Charges:  OT General Charges $OT Visit: 1 Visit OT Evaluation $OT Eval Low Complexity: [place],  OTR/L Acute Rehab Pager: [phone] Office: [place] 03/10/2018, 10:12 AM

## 2018-03-11 LAB — GLUCOSE, CAPILLARY: Glucose-Capillary: 107 mg/dL — ABNORMAL HIGH (ref 70–99)

## 2018-03-11 MED ORDER — METHOCARBAMOL 750 MG PO TABS: 750.0000 mg | ORAL_TABLET | Freq: Three times a day (TID) | ORAL | 1 refills | Status: DC | PRN

## 2018-03-11 MED ORDER — ASPIRIN 81 MG PO TABS: 81.0000 mg | ORAL_TABLET | Freq: Every day | ORAL | 0 refills | Status: AC

## 2018-03-11 MED ORDER — OXYCODONE-ACETAMINOPHEN 7.5-325 MG PO TABS: 1.0000 | ORAL_TABLET | ORAL | 0 refills | Status: DC | PRN

## 2018-03-11 NOTE — Progress Notes (Signed)
Discharged patient home with family, discharge instructions reviewed and all questions were answered.  PIV removed per policy. 

## 2018-03-11 NOTE — Plan of Care (Signed)
Pain being control with PO  painmedications

## 2018-03-11 NOTE — Discharge Summary (Signed)
Physician Discharge Summary  Patient ID: James Lee MRN: [id number] DOB/AGE: [birth year] 65 y.o.  Admit date: 03/09/2018 Discharge date: 03/11/2018  Admission Diagnoses:  Lumbar stenosis  Discharge Diagnoses:  Same Active Problems:   S/P lumbar spinal fusion   Discharged Condition: Stable  Hospital Course:  James Lee is a 67 y.o. male who was admitted for the below procedure. There were no post operative complications. At time of discharge, pain was well controlled, ambulating with Pt/OT, tolerating po, voiding normal. Ready for discharge.  Treatments: Surgery 1. Decompressive lumbar laminectomy L3-4 requiring more work than would be required for a simple exposure of the disk for PLIF in order to adequately decompress the neural elements and address the spinal stenosis 2. Posterior lumbar interbody fusion L3-4 using porous titanium interbody cages packed with morcellized allograft and autograft soaked with a bone marrow aspirate obtained through a separate fascial incision over the right iliac crest 3. Posterior fixation L3-4 using Alphatec cortical pedicle screws.  4. Intertransverse arthrodesis L3-4 using morcellized autograft and allograft.   Discharge Exam: Blood pressure 120/74, pulse 68, temperature 97.7 F (36.5 C), temperature source Oral, resp. rate 18, height 5\' 11"  (1.803 m), weight 115.5 kg, SpO2 95 %. Awake, alert, oriented Speech fluent, appropriate CN grossly intact 5/5 BUE/BLE Wound c/d/i  Disposition: Discharge disposition: 01-Home or Self Care       Discharge Instructions    Call MD for:  difficulty breathing, headache or visual disturbances   Complete by:  As directed    Call MD for:  persistant dizziness or light-headedness   Complete by:  As directed    Call MD for:  redness, tenderness, or signs of infection (pain, swelling, redness, odor or green/yellow discharge around incision site)   Complete by:  As directed    Call MD for:  severe  uncontrolled pain   Complete by:  As directed    Call MD for:  temperature >100.4   Complete by:  As directed    Diet general   Complete by:  As directed    Driving Restrictions   Complete by:  As directed    Do not drive until given clearance.   Increase activity slowly   Complete by:  As directed    Lifting restrictions   Complete by:  As directed    Do not lift anything >10lbs. Avoid bending and twisting in awkward positions. Avoid bending at the back.   May shower / Bathe   Complete by:  As directed    In 24 hours. Okay to wash wound with warm soapy water. Avoid scrubbing the wound. Pat dry.   Remove dressing in 24 hours   Complete by:  As directed      Allergies as of 03/11/2018   No Known Allergies     Medication List    TAKE these medications   ACCU-CHEK AVIVA PLUS test strip Generic drug:  glucose blood CHECK BLOOD SUGAR IN THE AM BEFORE BREAKFAST AND IN THER EVENING AS NEEDED E11.9   amLODipine 10 MG tablet Commonly known as:  NORVASC Take 10 mg by mouth daily.   aspirin 81 MG tablet Take 1 tablet (81 mg total) by mouth daily. Start taking on:  03/16/2018 What changed:  These instructions start on 03/16/2018. If you are unsure what to do until then, ask your doctor or other care provider.   bismuth-metronidazole-tetracycline 140-125-125 MG capsule Commonly known as:  PYLERA Take 3 capsules by mouth 4 (four) times daily -  before  meals and at bedtime.   BYSTOLIC 5 MG tablet Generic drug:  nebivolol Take 5 mg by mouth daily.   gabapentin 300 MG capsule Commonly known as:  NEURONTIN Take 300 mg by mouth at bedtime.   metFORMIN 500 MG tablet Commonly known as:  GLUCOPHAGE Take 1,000 mg by mouth 2 (two) times daily.   methocarbamol 750 MG tablet Commonly known as:  ROBAXIN Take 1 tablet (750 mg total) by mouth 3 (three) times daily as needed for muscle spasms.   multivitamin with minerals tablet Take 1 tablet by mouth daily.   oxyCODONE-acetaminophen  7.5-325 MG tablet Commonly known as:  PERCOCET Take 1 tablet by mouth every 4 (four) hours as needed.   pantoprazole 40 MG tablet Commonly known as:  PROTONIX Take 1 tablet (40 mg total) by mouth 2 (two) times daily. Take for a full 30 days   simvastatin 40 MG tablet Commonly known as:  ZOCOR Take 40 mg by mouth daily.   SYRINGE 3CC/22GX1-1/2" 22G X 1-1/2" 3 ML Misc Use for B-12 injections.   valsartan-hydrochlorothiazide 320-25 MG tablet Commonly known as:  DIOVAN-HCT Take 1 tablet by mouth daily.            Durable Medical Equipment  (From admission, onward)         Start     Ordered   03/09/18 1529  DME Walker rolling  Once    Question:  Patient needs a walker to treat with the following condition  Answer:  S/P lumbar fusion   03/09/18 1528   03/09/18 1529  DME 3 n 1  Once     03/09/18 1528         Follow-up Information    Eustace Moore, MD Follow up.   Specialty:  Neurosurgery Contact information: [address] [place] [address] [place] [address] [phone]           Signed: Traci Sermon 03/11/2018, 8:25 AM

## 2018-03-11 NOTE — Care Management (Signed)
[facility] aware to deliver RW and 3n1 to patient's room.  Equipment will be delivered prior to dc.  Pt aware and will accept equipment.

## 2018-03-12 MED FILL — Thrombin (Recombinant) For Soln 20000 Unit: CUTANEOUS | Qty: 1 | Status: AC

## 2018-03-12 NOTE — Anesthesia Postprocedure Evaluation (Signed)
Anesthesia Post Note  Patient: James Lee  Procedure(s) Performed: PLIF - L3-L4 (N/A Back)     Patient location during evaluation: PACU Anesthesia Type: General Level of consciousness: awake and alert Pain management: pain level controlled Vital Signs Assessment: post-procedure vital signs reviewed and stable Respiratory status: spontaneous breathing, nonlabored ventilation, respiratory function stable and patient connected to nasal cannula oxygen Cardiovascular status: blood pressure returned to baseline and stable Postop Assessment: no apparent nausea or vomiting Anesthetic complications: no    Last Vitals:  Vitals:   03/10/18 2351 03/11/18 0409  BP: 132/74 120/74  Pulse: 68 68  Resp:  18  Temp: 36.9 C 36.5 C  SpO2: 96% 95%    Last Pain:  Vitals:   03/11/18 0806  TempSrc:   PainSc: 2    Pain Goal: Patients Stated Pain Goal: 4 (03/11/18 0707)               James Lee [name]

## 2018-06-02 ENCOUNTER — Encounter: Payer: Self-pay | Admitting: Gastroenterology

## 2018-06-16 ENCOUNTER — Other Ambulatory Visit: Payer: Self-pay | Admitting: Physician Assistant

## 2018-11-21 IMAGING — RF DG UGI W/ GASTROGRAFIN
8 series · 8 of 8 positions shown · IV contrast (omnipaque)
Comparison: None.

CLINICAL DATA: Postop from repair of perforated gastric ulcers.
Evaluate for postop leak and gastric outlet obstruction..

EXAM:
WATER SOLUBLE UPPER GI SERIES
TECHNIQUE: Single-column upper GI series was performed using Omnipaque 300
water soluble contrast, which was injected through the existing
nasogastric tube.

[Series 1: t abdomen supine · 0.15mm/px · 1 of 1 slices shown]
[im 1/1]
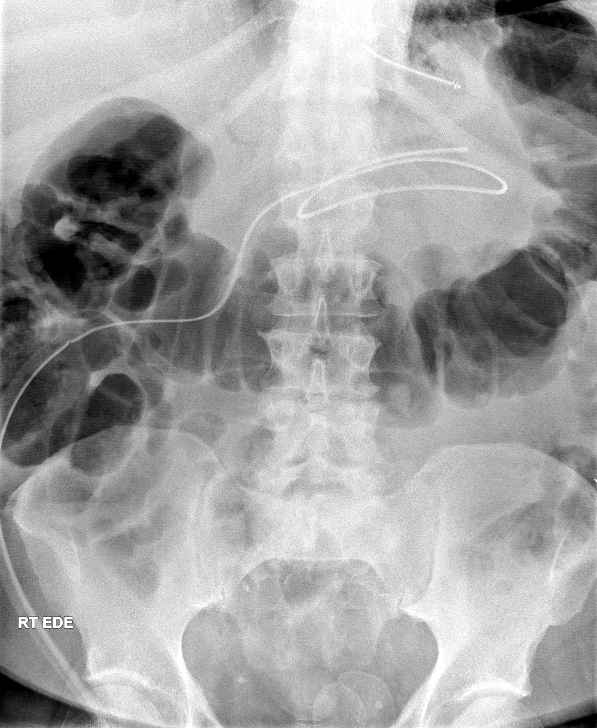

[Series 2: cp_standard · 0.27mm/px · 1 of 1 slices shown (1 of 7)]
[im 1/1]
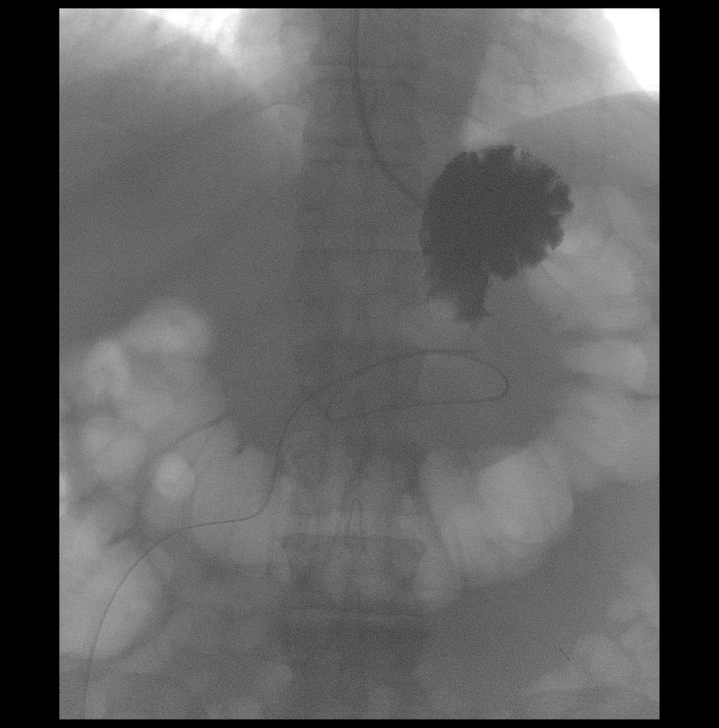

[Series 3: cp_standard · 0.27mm/px · 1 of 1 slices shown (2 of 7)]
[im 1/1]
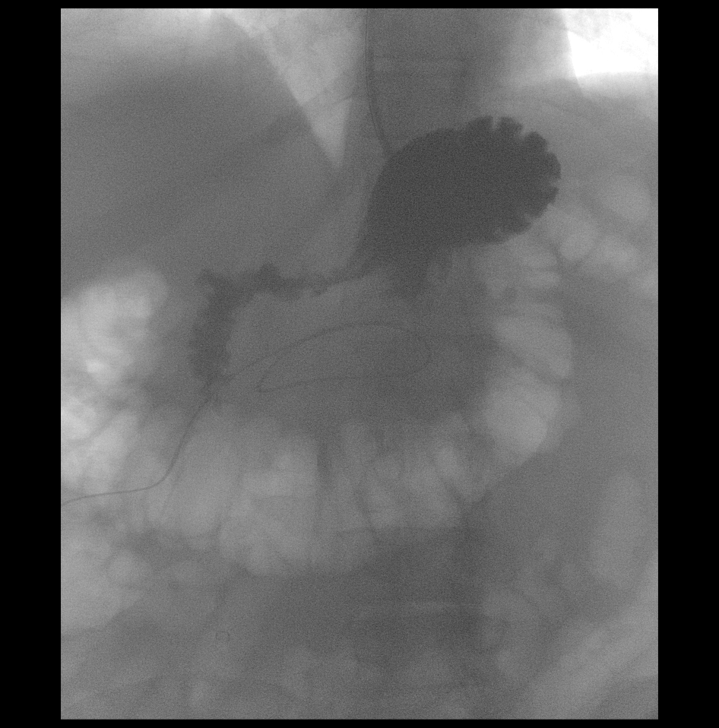

[Series 4: cp_standard · 0.27mm/px · 1 of 1 slices shown (3 of 7)]
[im 1/1]
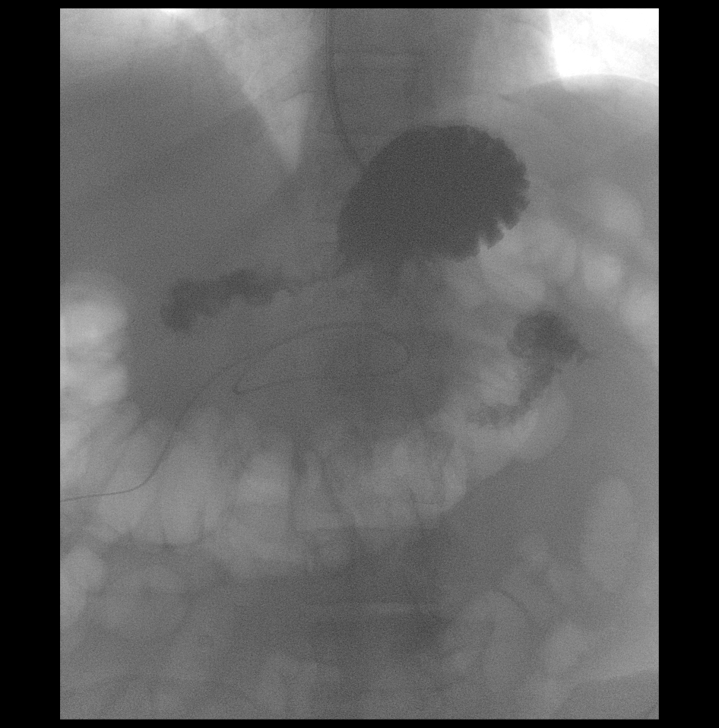

[Series 5: cp_standard · 0.27mm/px · 1 of 1 slices shown (4 of 7)]
[im 1/1]
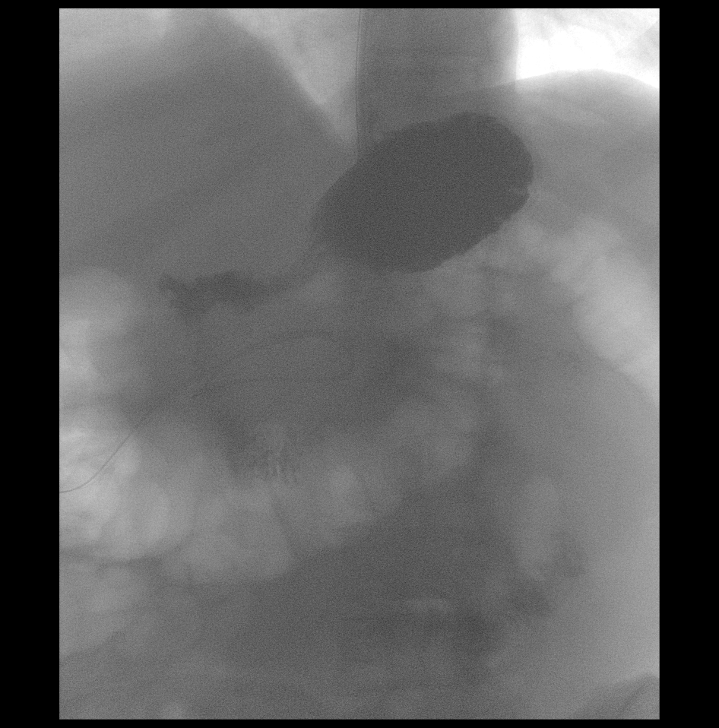

[Series 6: cp_standard · 0.27mm/px · 1 of 1 slices shown (5 of 7)]
[im 1/1]
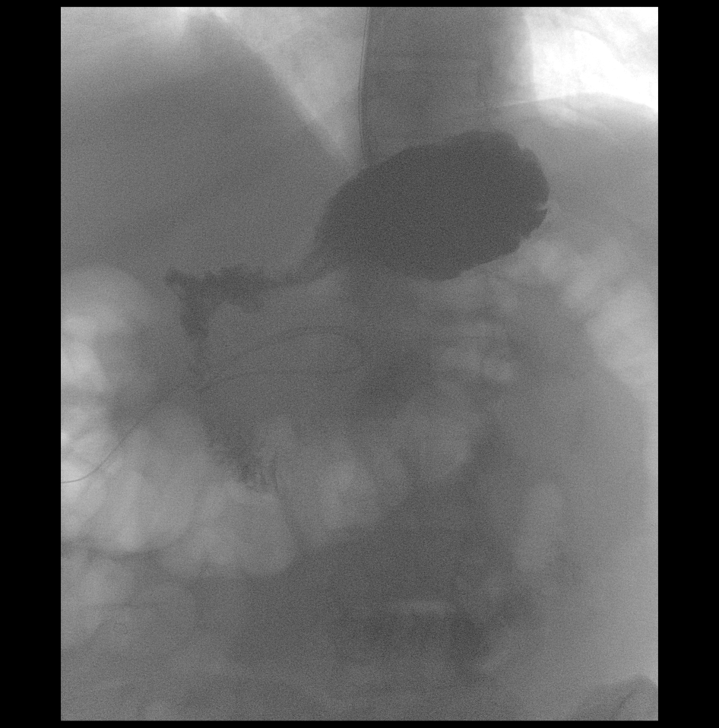

[Series 7: cp_standard · 0.27mm/px · 1 of 1 slices shown (6 of 7)]
[im 1/1]
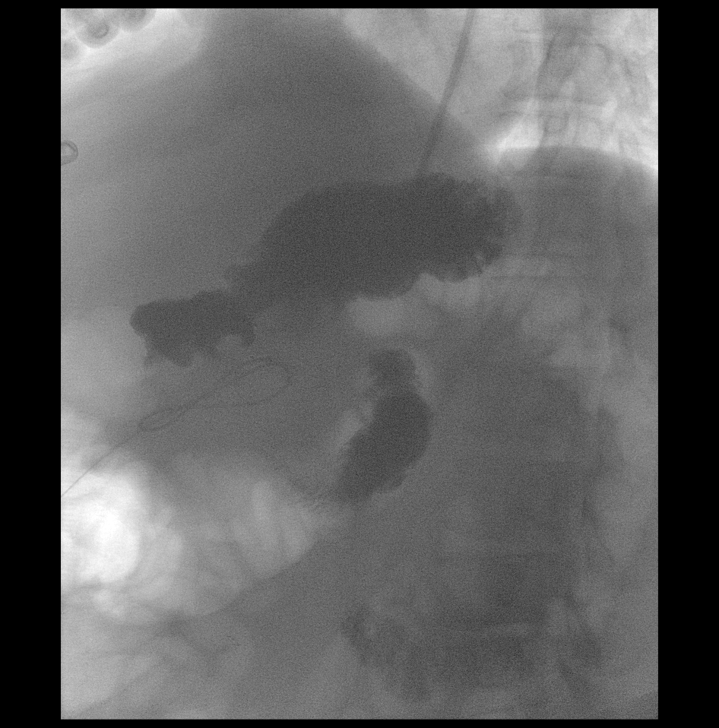

[Series 8: cp_standard · 0.26mm/px · 1 of 1 slices shown (7 of 7)]
[im 1/1]
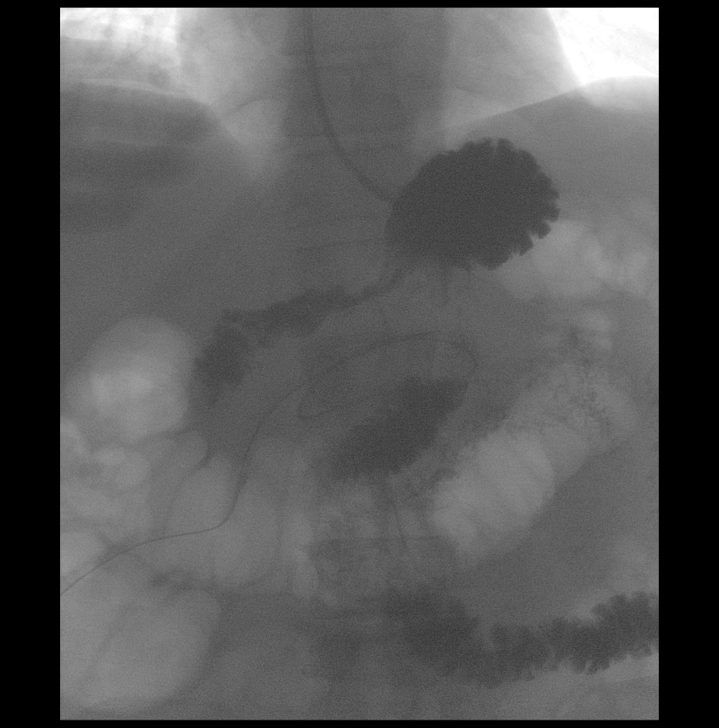

[8 of 8 positions shown; findings below may reference images not displayed]

FLUOROSCOPY TIME:  Fluoroscopy Time:  1 minutes 12 seconds

Radiation Exposure Index (if provided by the fluoroscopic device):
32.4 mGy

Number of Acquired Spot Images: 0
FINDINGS: The scout film shows a nonobstructive bowel gas pattern. A surgical
drain in seen in place. A nasogastric tube is seen with tip
overlying the proximal stomach.

Upper GI series was performed by injecting water-soluble contrast
through the existing nasogastric tube. This shows narrowing and
irregular contour of the distal gastric body and antrum, likely due
to postop edema. There is no evidence of contrast leak or
extravasation from the stomach. Prompt gastric emptying is seen, and
the duodenal sweep is normal in appearance.
IMPRESSION: Narrowing and irregular contour of the distal gastric body and
antrum, most likely due to postop edema. No evidence of contrast
leak or gastric outlet obstruction.

## 2019-06-26 ENCOUNTER — Other Ambulatory Visit: Payer: Self-pay | Admitting: Physician Assistant

## 2019-06-26 NOTE — Telephone Encounter (Signed)
Patient has been notified and aware. He will contact his primary doctor for additional refills.

## 2019-06-26 NOTE — Telephone Encounter (Signed)
Please advise on refills for Protonix. Patient was last seen 12-2017

## 2019-06-26 NOTE — Telephone Encounter (Signed)
Okay to refill x3, then should have office visit with primary GI MD

## 2019-09-20 ENCOUNTER — Other Ambulatory Visit: Payer: Self-pay | Admitting: Physician Assistant

## 2019-10-11 ENCOUNTER — Telehealth: Payer: Self-pay | Admitting: Physician Assistant

## 2019-10-11 NOTE — Telephone Encounter (Signed)
After reviewing chart, contacted patient and advised that he had been advised in January that he needed to follow up with his primary care to have his take over refills of his pantoprazole.

## 2019-10-11 NOTE — Telephone Encounter (Signed)
Patient needs to refill pantoprazole. Please send it to [facility].

## 2019-10-14 DIAGNOSIS — H518 Other specified disorders of binocular movement: Secondary | ICD-10-CM | POA: Diagnosis not present

## 2019-10-14 DIAGNOSIS — H2511 Age-related nuclear cataract, right eye: Secondary | ICD-10-CM | POA: Diagnosis not present

## 2019-10-14 DIAGNOSIS — H2513 Age-related nuclear cataract, bilateral: Secondary | ICD-10-CM | POA: Diagnosis not present

## 2019-10-14 DIAGNOSIS — H5231 Anisometropia: Secondary | ICD-10-CM | POA: Diagnosis not present

## 2019-12-02 DIAGNOSIS — H6123 Impacted cerumen, bilateral: Secondary | ICD-10-CM | POA: Diagnosis not present

## 2020-04-03 DIAGNOSIS — E785 Hyperlipidemia, unspecified: Secondary | ICD-10-CM | POA: Diagnosis not present

## 2020-04-03 DIAGNOSIS — I10 Essential (primary) hypertension: Secondary | ICD-10-CM | POA: Diagnosis not present

## 2020-04-03 DIAGNOSIS — I2510 Atherosclerotic heart disease of native coronary artery without angina pectoris: Secondary | ICD-10-CM | POA: Diagnosis not present

## 2020-04-03 DIAGNOSIS — E119 Type 2 diabetes mellitus without complications: Secondary | ICD-10-CM | POA: Diagnosis not present

## 2020-05-11 DIAGNOSIS — C61 Malignant neoplasm of prostate: Secondary | ICD-10-CM | POA: Diagnosis not present

## 2020-05-11 DIAGNOSIS — E785 Hyperlipidemia, unspecified: Secondary | ICD-10-CM | POA: Diagnosis not present

## 2020-05-11 DIAGNOSIS — I2510 Atherosclerotic heart disease of native coronary artery without angina pectoris: Secondary | ICD-10-CM | POA: Diagnosis not present

## 2020-05-11 DIAGNOSIS — I10 Essential (primary) hypertension: Secondary | ICD-10-CM | POA: Diagnosis not present

## 2020-05-20 DIAGNOSIS — Z23 Encounter for immunization: Secondary | ICD-10-CM | POA: Diagnosis not present

## 2020-05-20 DIAGNOSIS — E119 Type 2 diabetes mellitus without complications: Secondary | ICD-10-CM | POA: Diagnosis not present

## 2020-05-20 DIAGNOSIS — C61 Malignant neoplasm of prostate: Secondary | ICD-10-CM | POA: Diagnosis not present

## 2020-05-20 DIAGNOSIS — Z0000 Encounter for general adult medical examination without abnormal findings: Secondary | ICD-10-CM | POA: Diagnosis not present

## 2020-06-10 DIAGNOSIS — I6523 Occlusion and stenosis of bilateral carotid arteries: Secondary | ICD-10-CM | POA: Diagnosis not present

## 2020-06-10 DIAGNOSIS — I779 Disorder of arteries and arterioles, unspecified: Secondary | ICD-10-CM | POA: Diagnosis not present

## 2020-06-11 DIAGNOSIS — I10 Essential (primary) hypertension: Secondary | ICD-10-CM | POA: Diagnosis not present

## 2020-06-11 DIAGNOSIS — G8929 Other chronic pain: Secondary | ICD-10-CM | POA: Diagnosis not present

## 2020-06-11 DIAGNOSIS — E119 Type 2 diabetes mellitus without complications: Secondary | ICD-10-CM | POA: Diagnosis not present

## 2020-06-11 DIAGNOSIS — E785 Hyperlipidemia, unspecified: Secondary | ICD-10-CM | POA: Diagnosis not present

## 2020-07-28 DIAGNOSIS — Z7984 Long term (current) use of oral hypoglycemic drugs: Secondary | ICD-10-CM | POA: Diagnosis not present

## 2020-07-28 DIAGNOSIS — E119 Type 2 diabetes mellitus without complications: Secondary | ICD-10-CM | POA: Diagnosis not present

## 2020-08-05 DIAGNOSIS — I10 Essential (primary) hypertension: Secondary | ICD-10-CM | POA: Diagnosis not present

## 2020-08-05 DIAGNOSIS — E119 Type 2 diabetes mellitus without complications: Secondary | ICD-10-CM | POA: Diagnosis not present

## 2020-08-05 DIAGNOSIS — E785 Hyperlipidemia, unspecified: Secondary | ICD-10-CM | POA: Diagnosis not present

## 2020-08-05 DIAGNOSIS — E1142 Type 2 diabetes mellitus with diabetic polyneuropathy: Secondary | ICD-10-CM | POA: Diagnosis not present

## 2020-09-10 DIAGNOSIS — I10 Essential (primary) hypertension: Secondary | ICD-10-CM | POA: Diagnosis not present

## 2020-09-10 DIAGNOSIS — E785 Hyperlipidemia, unspecified: Secondary | ICD-10-CM | POA: Diagnosis not present

## 2020-09-10 DIAGNOSIS — C61 Malignant neoplasm of prostate: Secondary | ICD-10-CM | POA: Diagnosis not present

## 2020-09-10 DIAGNOSIS — I2510 Atherosclerotic heart disease of native coronary artery without angina pectoris: Secondary | ICD-10-CM | POA: Diagnosis not present

## 2020-10-10 DIAGNOSIS — I10 Essential (primary) hypertension: Secondary | ICD-10-CM | POA: Diagnosis not present

## 2020-10-10 DIAGNOSIS — E1142 Type 2 diabetes mellitus with diabetic polyneuropathy: Secondary | ICD-10-CM | POA: Diagnosis not present

## 2020-10-10 DIAGNOSIS — E785 Hyperlipidemia, unspecified: Secondary | ICD-10-CM | POA: Diagnosis not present

## 2020-10-10 DIAGNOSIS — E119 Type 2 diabetes mellitus without complications: Secondary | ICD-10-CM | POA: Diagnosis not present

## 2020-10-13 DIAGNOSIS — E119 Type 2 diabetes mellitus without complications: Secondary | ICD-10-CM | POA: Diagnosis not present

## 2020-10-13 DIAGNOSIS — H518 Other specified disorders of binocular movement: Secondary | ICD-10-CM | POA: Diagnosis not present

## 2020-10-22 DIAGNOSIS — R2681 Unsteadiness on feet: Secondary | ICD-10-CM | POA: Diagnosis not present

## 2020-10-22 DIAGNOSIS — I10 Essential (primary) hypertension: Secondary | ICD-10-CM | POA: Diagnosis not present

## 2020-10-22 DIAGNOSIS — C61 Malignant neoplasm of prostate: Secondary | ICD-10-CM | POA: Diagnosis not present

## 2020-10-22 DIAGNOSIS — E1142 Type 2 diabetes mellitus with diabetic polyneuropathy: Secondary | ICD-10-CM | POA: Diagnosis not present

## 2020-11-06 DIAGNOSIS — E038 Other specified hypothyroidism: Secondary | ICD-10-CM | POA: Diagnosis not present

## 2020-11-06 DIAGNOSIS — E1169 Type 2 diabetes mellitus with other specified complication: Secondary | ICD-10-CM | POA: Diagnosis not present

## 2020-11-06 DIAGNOSIS — E1142 Type 2 diabetes mellitus with diabetic polyneuropathy: Secondary | ICD-10-CM | POA: Diagnosis not present

## 2020-11-06 DIAGNOSIS — C61 Malignant neoplasm of prostate: Secondary | ICD-10-CM | POA: Diagnosis not present

## 2020-11-13 DIAGNOSIS — E1142 Type 2 diabetes mellitus with diabetic polyneuropathy: Secondary | ICD-10-CM | POA: Diagnosis not present

## 2020-11-13 DIAGNOSIS — I10 Essential (primary) hypertension: Secondary | ICD-10-CM | POA: Diagnosis not present

## 2020-11-13 DIAGNOSIS — Z7984 Long term (current) use of oral hypoglycemic drugs: Secondary | ICD-10-CM | POA: Diagnosis not present

## 2020-11-29 DIAGNOSIS — E1142 Type 2 diabetes mellitus with diabetic polyneuropathy: Secondary | ICD-10-CM | POA: Diagnosis not present

## 2020-11-29 DIAGNOSIS — E038 Other specified hypothyroidism: Secondary | ICD-10-CM | POA: Diagnosis not present

## 2020-11-29 DIAGNOSIS — E1169 Type 2 diabetes mellitus with other specified complication: Secondary | ICD-10-CM | POA: Diagnosis not present

## 2020-11-29 DIAGNOSIS — C61 Malignant neoplasm of prostate: Secondary | ICD-10-CM | POA: Diagnosis not present

## 2020-11-30 ENCOUNTER — Ambulatory Visit: Payer: Self-pay | Admitting: Podiatry

## 2020-11-30 ENCOUNTER — Other Ambulatory Visit: Payer: Self-pay

## 2020-11-30 ENCOUNTER — Encounter: Payer: Self-pay | Admitting: Podiatry

## 2020-11-30 DIAGNOSIS — E1142 Type 2 diabetes mellitus with diabetic polyneuropathy: Secondary | ICD-10-CM | POA: Diagnosis not present

## 2020-11-30 DIAGNOSIS — M2030 Hallux varus (acquired), unspecified foot: Secondary | ICD-10-CM

## 2020-11-30 DIAGNOSIS — E1140 Type 2 diabetes mellitus with diabetic neuropathy, unspecified: Secondary | ICD-10-CM | POA: Insufficient documentation

## 2020-11-30 MED ORDER — NONFORMULARY OR COMPOUNDED ITEM: 2 refills | Status: DC

## 2020-11-30 NOTE — Progress Notes (Addendum)
This patient presents to the office for his worsening neuropathy both feet.  Patient says his numbness and tingling has been worsening the last few months. He says he has had neuropathy for 2-3 years. He says he is also developing cramping in his feet. He is taking gabapentin for his neuropathy.  He desires to receive additional treatment to control his neuropathic symptoms.  He presents to the office with his wife.  Vascular  Dorsalis pedis are palpable  B/L. Posterior tibial pulses are absent  B/L. Capillary return  WNL.  Temperature gradient is  WNL.  Skin turgor  WNL  Sensorium  Senn Weinstein monofilament wire  WNL. Normal tactile sensation.  Nail Exam  Patient has normal nails with no evidence of bacterial or fungal infection.  Orthopedic  Exam  Muscle tone and muscle strength  WNL.  No limitations of motion feet  B/L.  No crepitus or joint effusion noted.  Foot type is unremarkable and digits show no abnormalities.  Bony prominences are unremarkable. Hallux malleus  B/L.  Skin  No open lesions.  Normal skin texture and turgor.   Diabetic neuropathy  IE.  Called in prescription to compounding pharmacy for neuropathy..  Told him to make an appointment for diabetic shoes upon leaving.  To add him to my neuropathy list.  Patient will be called for testing.  Patient qualifies for diabetic shoes due to DPN and hallux malleus  B/L.   Gardiner Barefoot DPM

## 2020-12-02 ENCOUNTER — Other Ambulatory Visit: Payer: Self-pay

## 2020-12-02 ENCOUNTER — Ambulatory Visit (INDEPENDENT_AMBULATORY_CARE_PROVIDER_SITE_OTHER): Payer: Medicare Other | Admitting: Podiatry

## 2020-12-02 ENCOUNTER — Encounter: Payer: Self-pay | Admitting: Podiatry

## 2020-12-02 DIAGNOSIS — E1142 Type 2 diabetes mellitus with diabetic polyneuropathy: Secondary | ICD-10-CM

## 2020-12-02 DIAGNOSIS — M2030 Hallux varus (acquired), unspecified foot: Secondary | ICD-10-CM

## 2020-12-02 NOTE — Progress Notes (Addendum)
Patient presented for foam casting for 3 pair custom diabetic shoe inserts. Patient is measured with a brannock device to be a size 11 1/2 x-wide  Diabetic shoes are chosen from the [employer] catalog.  The shoes chosen are [phone]  The patient will be contacted when the shoes and inserts are ready to be picked up  Gardiner Barefoot DPM

## 2021-01-03 DIAGNOSIS — J440 Chronic obstructive pulmonary disease with acute lower respiratory infection: Secondary | ICD-10-CM | POA: Diagnosis not present

## 2021-01-03 DIAGNOSIS — E785 Hyperlipidemia, unspecified: Secondary | ICD-10-CM | POA: Diagnosis not present

## 2021-01-03 DIAGNOSIS — I10 Essential (primary) hypertension: Secondary | ICD-10-CM | POA: Diagnosis not present

## 2021-01-03 DIAGNOSIS — E038 Other specified hypothyroidism: Secondary | ICD-10-CM | POA: Diagnosis not present

## 2021-01-07 DIAGNOSIS — Z8546 Personal history of malignant neoplasm of prostate: Secondary | ICD-10-CM | POA: Diagnosis not present

## 2021-01-07 DIAGNOSIS — N5089 Other specified disorders of the male genital organs: Secondary | ICD-10-CM | POA: Diagnosis not present

## 2021-01-07 DIAGNOSIS — E66812 Obesity, class 2: Secondary | ICD-10-CM | POA: Insufficient documentation

## 2021-01-07 DIAGNOSIS — E538 Deficiency of other specified B group vitamins: Secondary | ICD-10-CM | POA: Diagnosis not present

## 2021-01-07 DIAGNOSIS — N393 Stress incontinence (female) (male): Secondary | ICD-10-CM | POA: Diagnosis not present

## 2021-01-14 DIAGNOSIS — N433 Hydrocele, unspecified: Secondary | ICD-10-CM | POA: Diagnosis not present

## 2021-01-14 DIAGNOSIS — N503 Cyst of epididymis: Secondary | ICD-10-CM | POA: Diagnosis not present

## 2021-01-14 DIAGNOSIS — N441 Cyst of tunica albuginea testis: Secondary | ICD-10-CM | POA: Diagnosis not present

## 2021-01-14 DIAGNOSIS — I861 Scrotal varices: Secondary | ICD-10-CM | POA: Diagnosis not present

## 2021-01-15 ENCOUNTER — Telehealth: Payer: Self-pay | Admitting: Podiatry

## 2021-01-15 NOTE — Telephone Encounter (Signed)
Diabetic shoes/inserts in [place] office.. lvm for pt to call to get scheduled to pick them up... I need to know if he wants [facility] on 8.11  or [facility] on 8.17 or 8.31

## 2021-01-27 ENCOUNTER — Other Ambulatory Visit: Payer: Self-pay

## 2021-01-27 ENCOUNTER — Encounter: Payer: Self-pay | Admitting: Podiatry

## 2021-01-27 ENCOUNTER — Ambulatory Visit (INDEPENDENT_AMBULATORY_CARE_PROVIDER_SITE_OTHER): Payer: Medicare Other | Admitting: Podiatry

## 2021-01-27 DIAGNOSIS — E1142 Type 2 diabetes mellitus with diabetic polyneuropathy: Secondary | ICD-10-CM

## 2021-01-27 DIAGNOSIS — M2030 Hallux varus (acquired), unspecified foot: Secondary | ICD-10-CM

## 2021-01-27 NOTE — Progress Notes (Addendum)
Patient presents to the office to pick up diabetic shoes and 3 pair of diabetic custom inserts  Diabetic shoes were fitted to the patient and patient stated that they were slipping up and down and patient stated that they was to be a size 11 and an x-wide  I stated to the patient that I would order a size 11 x-wide and would call the patient when the shoes come in  Gardiner Barefoot DPM

## 2021-01-28 ENCOUNTER — Ambulatory Visit: Payer: Medicare Other | Admitting: Podiatry

## 2021-02-08 DIAGNOSIS — E785 Hyperlipidemia, unspecified: Secondary | ICD-10-CM | POA: Diagnosis not present

## 2021-02-08 DIAGNOSIS — E038 Other specified hypothyroidism: Secondary | ICD-10-CM | POA: Diagnosis not present

## 2021-02-08 DIAGNOSIS — E1142 Type 2 diabetes mellitus with diabetic polyneuropathy: Secondary | ICD-10-CM | POA: Diagnosis not present

## 2021-02-08 DIAGNOSIS — I10 Essential (primary) hypertension: Secondary | ICD-10-CM | POA: Diagnosis not present

## 2021-02-19 DIAGNOSIS — Z7984 Long term (current) use of oral hypoglycemic drugs: Secondary | ICD-10-CM | POA: Diagnosis not present

## 2021-02-19 DIAGNOSIS — E785 Hyperlipidemia, unspecified: Secondary | ICD-10-CM | POA: Diagnosis not present

## 2021-02-19 DIAGNOSIS — I10 Essential (primary) hypertension: Secondary | ICD-10-CM | POA: Diagnosis not present

## 2021-02-19 DIAGNOSIS — Z23 Encounter for immunization: Secondary | ICD-10-CM | POA: Diagnosis not present

## 2021-02-19 DIAGNOSIS — E1142 Type 2 diabetes mellitus with diabetic polyneuropathy: Secondary | ICD-10-CM | POA: Diagnosis not present

## 2021-03-01 ENCOUNTER — Telehealth: Payer: Self-pay | Admitting: Podiatry

## 2021-03-01 NOTE — Telephone Encounter (Signed)
Pt left message checking on status of diabetic shoes that were reordered. Please confirm they are there(they were sent last week to [facility]) and call pt with an appt.

## 2021-03-02 NOTE — Telephone Encounter (Signed)
Scheduled appt for pt on Monday.

## 2021-03-03 DIAGNOSIS — E038 Other specified hypothyroidism: Secondary | ICD-10-CM | POA: Diagnosis not present

## 2021-03-03 DIAGNOSIS — E785 Hyperlipidemia, unspecified: Secondary | ICD-10-CM | POA: Diagnosis not present

## 2021-03-03 DIAGNOSIS — E1142 Type 2 diabetes mellitus with diabetic polyneuropathy: Secondary | ICD-10-CM | POA: Diagnosis not present

## 2021-03-03 DIAGNOSIS — I10 Essential (primary) hypertension: Secondary | ICD-10-CM | POA: Diagnosis not present

## 2021-03-08 ENCOUNTER — Other Ambulatory Visit: Payer: Self-pay

## 2021-03-08 ENCOUNTER — Ambulatory Visit (INDEPENDENT_AMBULATORY_CARE_PROVIDER_SITE_OTHER): Payer: Medicare Other

## 2021-03-08 DIAGNOSIS — M2032 Hallux varus (acquired), left foot: Secondary | ICD-10-CM | POA: Diagnosis not present

## 2021-03-08 DIAGNOSIS — M2030 Hallux varus (acquired), unspecified foot: Secondary | ICD-10-CM

## 2021-03-08 DIAGNOSIS — M2031 Hallux varus (acquired), right foot: Secondary | ICD-10-CM

## 2021-03-08 DIAGNOSIS — E1142 Type 2 diabetes mellitus with diabetic polyneuropathy: Secondary | ICD-10-CM | POA: Diagnosis not present

## 2021-03-08 NOTE — Progress Notes (Signed)
Patient presents for diabetic shoe pick up, shoes are tried on for good fit.  Patient received 1 Pair and 3 pairs custom molded diabetic inserts.  Verbal and written break in and wear instructions given.  Patient will follow up for scheduled routine care.   

## 2021-03-09 DIAGNOSIS — G4733 Obstructive sleep apnea (adult) (pediatric): Secondary | ICD-10-CM | POA: Diagnosis not present

## 2021-03-16 DIAGNOSIS — I10 Essential (primary) hypertension: Secondary | ICD-10-CM | POA: Diagnosis not present

## 2021-04-08 DIAGNOSIS — I10 Essential (primary) hypertension: Secondary | ICD-10-CM | POA: Diagnosis not present

## 2021-04-08 DIAGNOSIS — E785 Hyperlipidemia, unspecified: Secondary | ICD-10-CM | POA: Diagnosis not present

## 2021-04-08 DIAGNOSIS — G4733 Obstructive sleep apnea (adult) (pediatric): Secondary | ICD-10-CM | POA: Diagnosis not present

## 2021-04-08 DIAGNOSIS — E038 Other specified hypothyroidism: Secondary | ICD-10-CM | POA: Diagnosis not present

## 2021-04-08 DIAGNOSIS — E1142 Type 2 diabetes mellitus with diabetic polyneuropathy: Secondary | ICD-10-CM | POA: Diagnosis not present

## 2021-04-15 ENCOUNTER — Other Ambulatory Visit: Payer: Self-pay

## 2021-04-15 ENCOUNTER — Ambulatory Visit: Payer: Medicare Other | Admitting: Podiatry

## 2021-04-26 ENCOUNTER — Other Ambulatory Visit: Payer: Self-pay

## 2021-04-27 ENCOUNTER — Ambulatory Visit: Payer: Medicare Other | Admitting: Cardiology

## 2021-04-27 ENCOUNTER — Encounter: Payer: Self-pay | Admitting: Cardiology

## 2021-04-27 ENCOUNTER — Other Ambulatory Visit: Payer: Self-pay

## 2021-04-27 VITALS — BP 158/90 | HR 72 | Ht 71.0 in | Wt 278.4 lb

## 2021-04-27 DIAGNOSIS — I2510 Atherosclerotic heart disease of native coronary artery without angina pectoris: Secondary | ICD-10-CM | POA: Diagnosis not present

## 2021-04-27 DIAGNOSIS — I10 Essential (primary) hypertension: Secondary | ICD-10-CM

## 2021-04-27 DIAGNOSIS — E7800 Pure hypercholesterolemia, unspecified: Secondary | ICD-10-CM | POA: Diagnosis not present

## 2021-04-27 DIAGNOSIS — G4733 Obstructive sleep apnea (adult) (pediatric): Secondary | ICD-10-CM

## 2021-04-27 DIAGNOSIS — I4510 Unspecified right bundle-branch block: Secondary | ICD-10-CM

## 2021-04-27 HISTORY — DX: Unspecified right bundle-branch block: I45.10

## 2021-04-27 LAB — LIPID PANEL
Chol/HDL Ratio: 4.7 ratio (ref 0.0–5.0)
Cholesterol, Total: 142 mg/dL (ref 100–199)
HDL: 30 mg/dL — ABNORMAL LOW (ref >39)
LDL Chol Calc (NIH): 75 mg/dL (ref 0–99)
Triglycerides: 225 mg/dL — ABNORMAL HIGH (ref 0–149)
VLDL Cholesterol Cal: 37 mg/dL (ref 5–40)

## 2021-04-27 LAB — ALT: ALT: 19 IU/L (ref 0–44)

## 2021-04-27 MED ORDER — METOPROLOL SUCCINATE ER 50 MG PO TB24: 50.0000 mg | ORAL_TABLET | Freq: Every day | ORAL | 3 refills | Status: DC

## 2021-04-27 NOTE — Addendum Note (Signed)
Addended by: Antonieta Iba on: 04/27/2021 09:16 AM   Modules accepted: Orders

## 2021-04-27 NOTE — Patient Instructions (Signed)
Medication Instructions:  Your physician has recommended you make the following change in your medication: 1) INCREASE Toprol XL (metoprolol succinate) 50 mg daily  *If you need a refill on your cardiac medications before your next appointment, please call your pharmacy*   Lab Work: TODAY: Fasting lipids and ALT If you have labs (blood work) drawn today and your tests are completely normal, you will receive your results only by: [place] (if you have MyChart) OR A paper copy in the mail If you have any lab test that is abnormal or we need to change your treatment, we will call you to review the results.   Testing/Procedures: Your provider has recommended that you have a calcium score CT scan.   Follow-Up: At [facility], you and your health needs are our priority.  As part of our continuing mission to provide you with exceptional heart care, we have created designated Provider Care Teams.  These Care Teams include your primary Cardiologist (physician) and Advanced Practice Providers (APPs -  Physician Assistants and Nurse Practitioners) who all work together to provide you with the care you need, when you need it.  Your next appointment:   1 year(s)  The format for your next appointment:   In Person  Provider:   Fransico Him, MD    Other Instructions Please check your blood pressure daily for one week and call us with a list of your readings.

## 2021-04-27 NOTE — Progress Notes (Signed)
Cardiology CONSULT Note    Date:  04/27/2021   ID:  James Lee, DOB 09-08-1950, MRN [id number]  PCP:  Aura Dials, MD  Cardiologist:  Fransico Him, MD   Chief Complaint  Patient presents with   Coronary Artery Disease   Hypertension   Hyperlipidemia   Sleep Apnea     History of Present Illness:  James Lee is a 70 y.o. male who is being seen today for the evaluation of ASCAD and reestablishment of cardiac care at the request of Aura Dials, MD.  This is a 70 year old male with a history of of hypertension, remote tobacco use, dyslipidemia, obstructive sleep apnea on CPAP and coronary artery calcifications noted on chest CT who was referred for cardiac evaluation given coronary calcifications.  He was seen remotely in 2016 and has a history of coronary artery calcifications noted on his chest CT in 2013 with a normal nuclear stress test in 2012.  He does have a family history of CAD as well as he has hyperlipidemia, hypertension and remote history of tobacco use.  He is here today for followup and is doing well.  He denies any chest pain or pressure, SOB, DOE, PND, orthopnea, dizziness, palpitations or syncope. He has chronic LE edema related to neuropathy.  He is compliant with his meds and is tolerating meds with no SE.     He is doing well with his CPAP device and thinks that He has gotten used to it.  He tolerates the mask and feels the pressure is adequate.  Since going on CPAP he feels rested in the am and has no significant daytime sleepiness.  He denies any significant mouth or nasal dryness or nasal congestion.  He does not think that he snores.     Past Medical History:  Diagnosis Date   Colon polyps    Coronary artery calcification seen on CAT scan    Diabetes mellitus without complication ([place])    Dyslipidemia    GERD (gastroesophageal reflux disease)    Hyperlipidemia    Hypertension    LBP (low back pain)    Prostate cancer ([place]) 2014   prostate     RBBB    Sleep apnea    c-pap wears    Past Surgical History:  Procedure Laterality Date   ABDOMINAL SURGERY     gastric ulcer   back sugery     1996, 2017, 2018   [place]   2017, 2018, 2019   gastric ulcers     surgery 11-28-17   INGUINAL HERNIA REPAIR     right   LAPAROSCOPY N/A 11/28/2017   Procedure: LAPAROSCOPY DIAGNOSTIC converted to open;  Surgeon: Ralene Ok, MD;  Location: [facility];  Service: General;  Laterality: N/A;   LAPAROTOMY N/A 11/28/2017   Procedure: EXPLORATORY LAPAROTOMY;  Surgeon: Ralene Ok, MD;  Location: [facility];  Service: General;  Laterality: N/A;   MENISCUS REPAIR Right    POSTERIOR CERVICAL FUSION/FORAMINOTOMY N/A 11/10/2017   Procedure: Posterior cervical fusion with lateral mass fixation Cervical Three-Seven, cervical laminectomy Cervical Three- Seven;  Surgeon: Eustace Moore, MD;  Location: [facility];  Service: Neurosurgery;  Laterality: N/A;  Posterior cervical fusion with lateral mass fixation Cervical Three-Seven, cervical laminectomy Cervical Three- Seven   PROSTATECTOMY  01/2012   TONSILLECTOMY     TOOTH EXTRACTION      Current Medications: Current Meds  Medication Sig   ACCU-CHEK AVIVA PLUS test strip CHECK BLOOD SUGAR IN THE  AM BEFORE BREAKFAST AND IN THER EVENING AS NEEDED E11.9   amLODipine (NORVASC) 10 MG tablet Take 10 mg by mouth daily.   aspirin 81 MG tablet Take 1 tablet (81 mg total) by mouth daily.   gabapentin (NEURONTIN) 300 MG capsule Take 300 mg by mouth at bedtime.    glipiZIDE (GLUCOTROL XL) 5 MG 24 hr tablet Take 5 mg by mouth daily.   metFORMIN (GLUCOPHAGE) 500 MG tablet Take 1,000 mg by mouth 2 (two) times daily.    metoprolol succinate (TOPROL-XL) 25 MG 24 hr tablet Take 25 mg by mouth daily.   Multiple Vitamins-Minerals (MULTIVITAMIN WITH MINERALS) tablet Take 1 tablet by mouth daily.   NONFORMULARY OR COMPOUNDED ITEM Use as directed   simvastatin (ZOCOR) 40 MG tablet Take 40 mg by mouth daily.     valsartan-hydrochlorothiazide (DIOVAN-HCT) 320-25 MG tablet Take 1 tablet by mouth daily.    Allergies:   Patient has no known allergies.   Social History   Socioeconomic History   Marital status: Married    Spouse name: Not on file   Number of children: 2   Years of education: Not on file   Highest education level: Not on file  Occupational History   Occupation: Tel Visual merchandiser: [employer]  Tobacco Use   Smoking status: Former    Packs/day: 1.00    Years: 40.00    Pack years: 40.00    Types: Cigarettes    Start date: 03/13/2012   Smokeless tobacco: Never  Vaping Use   Vaping Use: Never used  Substance and Sexual Activity   Alcohol use: Yes    Alcohol/week: 0.0 standard drinks    Comment: rarely   Drug use: No   Sexual activity: Not on file  Other Topics Concern   Not on file  Social History Narrative   Not on file   Social Determinants of Health   Financial Resource Strain: Not on file  Food Insecurity: Not on file  Transportation Needs: Not on file  Physical Activity: Not on file  Stress: Not on file  Social Connections: Not on file     Family History:  The patient's family history includes Cancer in his father; Coronary artery disease in his mother; Esophageal cancer in his father; Prostate cancer in his brother; Throat cancer in his father.   ROS:   Please see the history of present illness.    ROS All other systems reviewed and are negative.  No flowsheet data found.     PHYSICAL EXAM:   VS:  BP (!) 158/90   Pulse 72   Ht 5\' 11"  (1.803 m)   Wt 278 lb 6.4 oz (126.3 kg)   SpO2 96%   BMI 38.83 kg/m    GEN: Well nourished, well developed, in no acute distress  HEENT: normal  Neck: no JVD, carotid bruits, or masses Cardiac: RRR; no murmurs, rubs, or gallops,no edema.  Intact distal pulses bilaterally.  Respiratory:  clear to auscultation bilaterally, normal work of breathing GI: soft, nontender, nondistended, + BS MS: no deformity or  atrophy  Skin: warm and dry, no rash Neuro:  Alert and Oriented x 3, Strength and sensation are intact Psych: euthymic mood, full affect  Wt Readings from Last 3 Encounters:  04/27/21 278 lb 6.4 oz (126.3 kg)  03/09/18 254 lb 11.2 oz (115.5 kg)  03/06/18 254 lb 11.2 oz (115.5 kg)      Studies/Labs Reviewed:   EKG:  EKG is ordered  today.  The ekg ordered today demonstrates NSR with RBBB  Recent Labs: No results found for requested labs within last 8760 hours.   Lipid Panel No results found for: CHOL, TRIG, HDL, CHOLHDL, VLDL, LDLCALC, LDLDIRECT  Additional studies/ records that were reviewed today include:  OV notes from PCP    ASSESSMENT:    1. OSA (obstructive sleep apnea)   2. Coronary artery calcification seen on CAT scan   3. Pure hypercholesterolemia   4. Benign essential HTN      PLAN:  In order of problems listed above:  OSA - The patient is tolerating PAP therapy well without any problems. The patient has been using and benefiting from PAP use and will continue to benefit from therapy.  -I will get a download from his DME  2.  Coronary artery calcifications by chest CTA -This was noted by CT in 2016 -He denies any chest pain or pressure with exertion and has chronic stable shortness of breath. -I will get a coronary calcium score to help assess future risk of cardiac events  3.  Hyperlipidemia -LDL goal less than 70 -LDL year ago was 77 -Repeat FLP and ALT.  4.  Hypertension -BP elevated today and runs this at home -I have personally reviewed and interpreted outside labs performed by patient's PCP which showed serum creatinine 1.36 and potassium 4.5 on 02/19/2021 -Continue prescription drug management with amlodipine 10 mg daily and valsartan HCT 320/25 mg daily with as needed and refills -increase Toprol XL to 50mg  daily and check BP and HR daily for 1 week and call  Time Spent: 25 minutes total time of encounter, including 15 minutes spent in  face-to-face patient care on the date of this encounter. This time includes coordination of care and counseling regarding above mentioned problem list. Remainder of non-face-to-face time involved reviewing chart documents/testing relevant to the patient encounter and documentation in the medical record. I have independently reviewed documentation from referring provider  Medication Adjustments/Labs and Tests Ordered: Current medicines are reviewed at length with the patient today.  Concerns regarding medicines are outlined above.  Medication changes, Labs and Tests ordered today are listed in the Patient Instructions below.  There are no Patient Instructions on file for this visit.   Signed, Fransico Him, MD  04/27/2021 9:05 AM    [facility] [address], [place], [place]  [address] Phone: [phone]; Fax: [phone]

## 2021-04-28 ENCOUNTER — Telehealth: Payer: Self-pay

## 2021-04-28 NOTE — Telephone Encounter (Signed)
Left message for patient to call back  

## 2021-04-28 NOTE — Telephone Encounter (Signed)
-----   Message from Sueanne Margarita, MD sent at 04/28/2021 12:21 PM EST ----- Change simvastatin to Crestor 40 mg daily and repeat FLP and ALT in 6 weeks ----- Message ----- From: Antonieta Iba, RN Sent: 04/28/2021  12:12 PM EST To: Sueanne Margarita, MD  Spoke with the patient who reports that he was fasting for his lab work.

## 2021-05-03 NOTE — Telephone Encounter (Signed)
Left message for patient to call back  

## 2021-05-09 DIAGNOSIS — G4733 Obstructive sleep apnea (adult) (pediatric): Secondary | ICD-10-CM | POA: Diagnosis not present

## 2021-05-11 NOTE — Telephone Encounter (Signed)
Left message for patient to call back  

## 2021-05-25 ENCOUNTER — Other Ambulatory Visit: Payer: Medicare Other

## 2021-06-21 DIAGNOSIS — N1831 Chronic kidney disease, stage 3a: Secondary | ICD-10-CM | POA: Diagnosis not present

## 2021-06-21 DIAGNOSIS — E1122 Type 2 diabetes mellitus with diabetic chronic kidney disease: Secondary | ICD-10-CM | POA: Diagnosis not present

## 2021-06-21 DIAGNOSIS — I129 Hypertensive chronic kidney disease with stage 1 through stage 4 chronic kidney disease, or unspecified chronic kidney disease: Secondary | ICD-10-CM | POA: Diagnosis not present

## 2021-06-21 DIAGNOSIS — I10 Essential (primary) hypertension: Secondary | ICD-10-CM | POA: Diagnosis not present

## 2021-06-21 DIAGNOSIS — G629 Polyneuropathy, unspecified: Secondary | ICD-10-CM | POA: Diagnosis not present

## 2021-06-25 DIAGNOSIS — Z0000 Encounter for general adult medical examination without abnormal findings: Secondary | ICD-10-CM | POA: Diagnosis not present

## 2021-06-25 DIAGNOSIS — I10 Essential (primary) hypertension: Secondary | ICD-10-CM | POA: Diagnosis not present

## 2021-06-25 DIAGNOSIS — E785 Hyperlipidemia, unspecified: Secondary | ICD-10-CM | POA: Diagnosis not present

## 2021-06-25 DIAGNOSIS — E1142 Type 2 diabetes mellitus with diabetic polyneuropathy: Secondary | ICD-10-CM | POA: Diagnosis not present

## 2021-07-01 ENCOUNTER — Ambulatory Visit (INDEPENDENT_AMBULATORY_CARE_PROVIDER_SITE_OTHER)
Admission: RE | Admit: 2021-07-01 | Discharge: 2021-07-01 | Disposition: A | Discharge status: Home / Self Care | Payer: Self-pay | Source: Ambulatory Visit | Attending: Cardiology | Admitting: Cardiology

## 2021-07-01 ENCOUNTER — Other Ambulatory Visit: Payer: Self-pay

## 2021-07-01 DIAGNOSIS — E7800 Pure hypercholesterolemia, unspecified: Secondary | ICD-10-CM

## 2021-07-01 DIAGNOSIS — I10 Essential (primary) hypertension: Secondary | ICD-10-CM

## 2021-07-01 DIAGNOSIS — G4733 Obstructive sleep apnea (adult) (pediatric): Secondary | ICD-10-CM

## 2021-07-01 DIAGNOSIS — I2510 Atherosclerotic heart disease of native coronary artery without angina pectoris: Secondary | ICD-10-CM

## 2021-07-08 ENCOUNTER — Telehealth: Payer: Self-pay

## 2021-07-08 DIAGNOSIS — I2510 Atherosclerotic heart disease of native coronary artery without angina pectoris: Secondary | ICD-10-CM

## 2021-07-08 NOTE — Telephone Encounter (Signed)
The patient has been notified of the result and verbalized understanding.  All questions (if any) were answered. Antonieta Iba, RN 07/08/2021 2:48 PM   Patient reports that he has mobility issues so would not be able to get on a treadmill. Advised that I will check with Dr. Radford Pax to see if she would like to order a lexiscan myoview instead.  Patient was fasting for his most recent lab work.

## 2021-07-08 NOTE — Telephone Encounter (Signed)
-----   Message from Sueanne Margarita, MD sent at 07/01/2021  4:13 PM EST ----- Coronary calcium score is high at 641.  I would like him to have a stress Myoview to rule out ischemia given how high it is.  His LDL was 75 and triglycerides 225 on 04/27/2021.  Please call him back to find out if he was fasting with his labs in November.  Follow-up with me in 1 year

## 2021-07-08 NOTE — Telephone Encounter (Signed)
Leane Call has been ordered.

## 2021-07-09 NOTE — Telephone Encounter (Signed)
Shared Decision Making/Informed Consent The risks [chest pain, shortness of breath, cardiac arrhythmias, dizziness, blood pressure fluctuations, myocardial infarction, stroke/transient ischemic attack, nausea, vomiting, allergic reaction, radiation exposure, metallic taste sensation and life-threatening complications (estimated to be 1 in 10,000)], benefits (risk stratification, diagnosing coronary artery disease, treatment guidance) and alternatives of a nuclear stress test were discussed in detail with James Lee and he agrees to proceed.

## 2021-07-19 ENCOUNTER — Telehealth (HOSPITAL_COMMUNITY): Payer: Self-pay | Admitting: *Deleted

## 2021-07-19 NOTE — Telephone Encounter (Signed)
Patient given detailed instructions per Myocardial Perfusion Study Information Sheet for the test on 07/26/2021 at 1:00. Patient notified to arrive 15 minutes early and that it is imperative to arrive on time for appointment to keep from having the test rescheduled.  If you need to cancel or reschedule your appointment, please call the office within 24 hours of your appointment. . Patient verbalized understanding.James Lee

## 2021-07-22 DIAGNOSIS — N1831 Chronic kidney disease, stage 3a: Secondary | ICD-10-CM | POA: Diagnosis not present

## 2021-07-26 ENCOUNTER — Other Ambulatory Visit: Payer: Self-pay

## 2021-07-26 ENCOUNTER — Ambulatory Visit (HOSPITAL_COMMUNITY): Payer: Medicare Other | Attending: Cardiovascular Disease

## 2021-07-26 DIAGNOSIS — I2510 Atherosclerotic heart disease of native coronary artery without angina pectoris: Secondary | ICD-10-CM | POA: Diagnosis not present

## 2021-07-26 MED ORDER — REGADENOSON 0.4 MG/5ML IV SOLN
0.4000 mg | Freq: Once | INTRAVENOUS | Status: AC
  Administered 2021-07-26: 0.4 mg via INTRAVENOUS

## 2021-07-26 MED ORDER — TECHNETIUM TC 99M TETROFOSMIN IV KIT
30.5000 | PACK | Freq: Once | INTRAVENOUS | Status: AC | PRN
  Administered 2021-07-26: 30.5 via INTRAVENOUS
  Filled 2021-07-26: qty 31

## 2021-07-27 ENCOUNTER — Encounter: Payer: Self-pay | Admitting: Cardiology

## 2021-07-27 ENCOUNTER — Ambulatory Visit (HOSPITAL_COMMUNITY): Payer: Medicare Other | Attending: Cardiology

## 2021-07-27 LAB — MYOCARDIAL PERFUSION IMAGING
LV dias vol: 94 mL (ref 62–150)
LV sys vol: 41 mL
Nuc Stress EF: 56 %
Peak HR: 89 {beats}/min
Rest HR: 74 {beats}/min
Rest Nuclear Isotope Dose: 31.5 mCi
SDS: 2
SRS: 0
SSS: 2
ST Depression (mm): 0 mm
Stress Nuclear Isotope Dose: 30.5 mCi
TID: 1

## 2021-07-27 MED ORDER — TECHNETIUM TC 99M TETROFOSMIN IV KIT
31.5000 | PACK | Freq: Once | INTRAVENOUS | Status: AC | PRN
  Administered 2021-07-27: 31.5 via INTRAVENOUS
  Filled 2021-07-27: qty 32

## 2021-07-27 NOTE — Telephone Encounter (Signed)
Error

## 2021-08-19 DIAGNOSIS — I10 Essential (primary) hypertension: Secondary | ICD-10-CM | POA: Diagnosis not present

## 2021-08-19 DIAGNOSIS — E785 Hyperlipidemia, unspecified: Secondary | ICD-10-CM | POA: Diagnosis not present

## 2021-08-19 DIAGNOSIS — G8929 Other chronic pain: Secondary | ICD-10-CM | POA: Diagnosis not present

## 2021-09-25 DIAGNOSIS — E785 Hyperlipidemia, unspecified: Secondary | ICD-10-CM | POA: Diagnosis not present

## 2021-09-25 DIAGNOSIS — I10 Essential (primary) hypertension: Secondary | ICD-10-CM | POA: Diagnosis not present

## 2021-09-25 DIAGNOSIS — E1142 Type 2 diabetes mellitus with diabetic polyneuropathy: Secondary | ICD-10-CM | POA: Diagnosis not present

## 2021-09-25 DIAGNOSIS — E038 Other specified hypothyroidism: Secondary | ICD-10-CM | POA: Diagnosis not present

## 2021-10-11 DIAGNOSIS — N1831 Chronic kidney disease, stage 3a: Secondary | ICD-10-CM | POA: Diagnosis not present

## 2021-10-11 DIAGNOSIS — R809 Proteinuria, unspecified: Secondary | ICD-10-CM | POA: Diagnosis not present

## 2021-10-11 DIAGNOSIS — E1122 Type 2 diabetes mellitus with diabetic chronic kidney disease: Secondary | ICD-10-CM | POA: Diagnosis not present

## 2021-10-11 DIAGNOSIS — I129 Hypertensive chronic kidney disease with stage 1 through stage 4 chronic kidney disease, or unspecified chronic kidney disease: Secondary | ICD-10-CM | POA: Diagnosis not present

## 2021-10-13 ENCOUNTER — Other Ambulatory Visit: Payer: Self-pay | Admitting: Internal Medicine

## 2021-10-13 DIAGNOSIS — N1831 Chronic kidney disease, stage 3a: Secondary | ICD-10-CM

## 2021-10-14 DIAGNOSIS — H5231 Anisometropia: Secondary | ICD-10-CM | POA: Diagnosis not present

## 2021-10-14 DIAGNOSIS — E119 Type 2 diabetes mellitus without complications: Secondary | ICD-10-CM | POA: Diagnosis not present

## 2021-10-14 DIAGNOSIS — H532 Diplopia: Secondary | ICD-10-CM | POA: Diagnosis not present

## 2021-10-14 DIAGNOSIS — H518 Other specified disorders of binocular movement: Secondary | ICD-10-CM | POA: Diagnosis not present

## 2021-10-22 ENCOUNTER — Ambulatory Visit
Admission: RE | Admit: 2021-10-22 | Discharge: 2021-10-22 | Disposition: A | Discharge status: Home / Self Care | Payer: Medicare Other | Source: Ambulatory Visit | Attending: Internal Medicine | Admitting: Internal Medicine

## 2021-10-22 DIAGNOSIS — N1831 Chronic kidney disease, stage 3a: Secondary | ICD-10-CM

## 2021-11-10 ENCOUNTER — Observation Stay
Admission: EM | Admit: 2021-11-10 | Discharge: 2021-11-11 | Disposition: A | Discharge status: Home / Self Care | Payer: Medicare Other | Attending: Student in an Organized Health Care Education/Training Program | Admitting: Student in an Organized Health Care Education/Training Program

## 2021-11-10 ENCOUNTER — Emergency Department: Payer: Medicare Other

## 2021-11-10 ENCOUNTER — Observation Stay: Payer: Medicare Other

## 2021-11-10 ENCOUNTER — Other Ambulatory Visit: Payer: Self-pay

## 2021-11-10 ENCOUNTER — Encounter: Payer: Self-pay | Admitting: Emergency Medicine

## 2021-11-10 DIAGNOSIS — Z6835 Body mass index (BMI) 35.0-35.9, adult: Secondary | ICD-10-CM | POA: Diagnosis not present

## 2021-11-10 DIAGNOSIS — Z87891 Personal history of nicotine dependence: Secondary | ICD-10-CM | POA: Insufficient documentation

## 2021-11-10 DIAGNOSIS — E119 Type 2 diabetes mellitus without complications: Secondary | ICD-10-CM

## 2021-11-10 DIAGNOSIS — R0602 Shortness of breath: Secondary | ICD-10-CM | POA: Diagnosis not present

## 2021-11-10 DIAGNOSIS — I129 Hypertensive chronic kidney disease with stage 1 through stage 4 chronic kidney disease, or unspecified chronic kidney disease: Secondary | ICD-10-CM | POA: Diagnosis not present

## 2021-11-10 DIAGNOSIS — E1110 Type 2 diabetes mellitus with ketoacidosis without coma: Secondary | ICD-10-CM | POA: Diagnosis not present

## 2021-11-10 DIAGNOSIS — Z8546 Personal history of malignant neoplasm of prostate: Secondary | ICD-10-CM | POA: Insufficient documentation

## 2021-11-10 DIAGNOSIS — R55 Syncope and collapse: Principal | ICD-10-CM | POA: Diagnosis present

## 2021-11-10 DIAGNOSIS — Z79899 Other long term (current) drug therapy: Secondary | ICD-10-CM | POA: Diagnosis not present

## 2021-11-10 DIAGNOSIS — J441 Chronic obstructive pulmonary disease with (acute) exacerbation: Secondary | ICD-10-CM | POA: Insufficient documentation

## 2021-11-10 DIAGNOSIS — R609 Edema, unspecified: Secondary | ICD-10-CM | POA: Insufficient documentation

## 2021-11-10 DIAGNOSIS — N189 Chronic kidney disease, unspecified: Secondary | ICD-10-CM | POA: Insufficient documentation

## 2021-11-10 DIAGNOSIS — I2510 Atherosclerotic heart disease of native coronary artery without angina pectoris: Secondary | ICD-10-CM | POA: Diagnosis not present

## 2021-11-10 DIAGNOSIS — R9082 White matter disease, unspecified: Secondary | ICD-10-CM | POA: Diagnosis not present

## 2021-11-10 DIAGNOSIS — Z7982 Long term (current) use of aspirin: Secondary | ICD-10-CM | POA: Insufficient documentation

## 2021-11-10 DIAGNOSIS — R7989 Other specified abnormal findings of blood chemistry: Secondary | ICD-10-CM

## 2021-11-10 DIAGNOSIS — R0902 Hypoxemia: Secondary | ICD-10-CM | POA: Diagnosis not present

## 2021-11-10 DIAGNOSIS — R749 Abnormal serum enzyme level, unspecified: Secondary | ICD-10-CM | POA: Insufficient documentation

## 2021-11-10 DIAGNOSIS — E1165 Type 2 diabetes mellitus with hyperglycemia: Secondary | ICD-10-CM | POA: Diagnosis not present

## 2021-11-10 DIAGNOSIS — E872 Acidosis, unspecified: Secondary | ICD-10-CM

## 2021-11-10 DIAGNOSIS — I10 Essential (primary) hypertension: Secondary | ICD-10-CM | POA: Diagnosis present

## 2021-11-10 DIAGNOSIS — E1122 Type 2 diabetes mellitus with diabetic chronic kidney disease: Secondary | ICD-10-CM | POA: Insufficient documentation

## 2021-11-10 DIAGNOSIS — Z7984 Long term (current) use of oral hypoglycemic drugs: Secondary | ICD-10-CM | POA: Insufficient documentation

## 2021-11-10 DIAGNOSIS — E1142 Type 2 diabetes mellitus with diabetic polyneuropathy: Secondary | ICD-10-CM

## 2021-11-10 DIAGNOSIS — R4020 Unspecified coma: Secondary | ICD-10-CM | POA: Diagnosis not present

## 2021-11-10 DIAGNOSIS — M7989 Other specified soft tissue disorders: Secondary | ICD-10-CM | POA: Diagnosis not present

## 2021-11-10 DIAGNOSIS — I959 Hypotension, unspecified: Secondary | ICD-10-CM | POA: Diagnosis not present

## 2021-11-10 HISTORY — DX: Syncope and collapse: R55

## 2021-11-10 LAB — URINALYSIS, ROUTINE W REFLEX MICROSCOPIC
Bilirubin Urine: NEGATIVE
Glucose, UA: >=500 mg/dL — AB
Hgb urine dipstick: NEGATIVE
Ketones, ur: NEGATIVE mg/dL
Leukocytes,Ua: NEGATIVE
Nitrite: NEGATIVE
Protein, ur: 100 mg/dL — AB
Specific Gravity, Urine: 1.007 (ref 1.005–1.030)
pH: 6 (ref 5.0–8.0)

## 2021-11-10 LAB — BLOOD GAS, VENOUS
Acid-base deficit: 1.2 mmol/L (ref 0.0–2.0)
Bicarbonate: 25.7 mmol/L (ref 20.0–28.0)
O2 Saturation: 63.7 %
Patient temperature: 37
pCO2, Ven: 51 mmHg (ref 44–60)
pH, Ven: 7.31 (ref 7.25–7.43)
pO2, Ven: 41 mmHg (ref 32–45)

## 2021-11-10 LAB — CBC
HCT: 38.2 % — ABNORMAL LOW (ref 39.0–52.0)
Hemoglobin: 12.5 g/dL — ABNORMAL LOW (ref 13.0–17.0)
MCH: 31.2 pg (ref 26.0–34.0)
MCHC: 32.7 g/dL (ref 30.0–36.0)
MCV: 95.3 fL (ref 80.0–100.0)
Platelets: 208 10*3/uL (ref 150–400)
RBC: 4.01 MIL/uL — ABNORMAL LOW (ref 4.22–5.81)
RDW: 12.7 % (ref 11.5–15.5)
WBC: 9.5 10*3/uL (ref 4.0–10.5)
nRBC: 0 % (ref 0.0–0.2)

## 2021-11-10 LAB — CBG MONITORING, ED: Glucose-Capillary: 313 mg/dL — ABNORMAL HIGH (ref 70–99)

## 2021-11-10 LAB — URINE DRUG SCREEN, QUALITATIVE (ARMC ONLY)
Amphetamines, Ur Screen: NOT DETECTED
Barbiturates, Ur Screen: NOT DETECTED
Benzodiazepine, Ur Scrn: NOT DETECTED
Cannabinoid 50 Ng, Ur ~~LOC~~: POSITIVE — AB
Cocaine Metabolite,Ur ~~LOC~~: NOT DETECTED
MDMA (Ecstasy)Ur Screen: NOT DETECTED
Methadone Scn, Ur: NOT DETECTED
Opiate, Ur Screen: NOT DETECTED
Phencyclidine (PCP) Ur S: NOT DETECTED
Tricyclic, Ur Screen: NOT DETECTED

## 2021-11-10 LAB — BASIC METABOLIC PANEL WITH GFR
Anion gap: 7 (ref 5–15)
BUN: 22 mg/dL (ref 8–23)
CO2: 22 mmol/L (ref 22–32)
Calcium: 8.2 mg/dL — ABNORMAL LOW (ref 8.9–10.3)
Chloride: 109 mmol/L (ref 98–111)
Creatinine, Ser: 1.75 mg/dL — ABNORMAL HIGH (ref 0.61–1.24)
GFR, Estimated: 42 mL/min — ABNORMAL LOW
Glucose, Bld: 176 mg/dL — ABNORMAL HIGH (ref 70–99)
Potassium: 3.7 mmol/L (ref 3.5–5.1)
Sodium: 138 mmol/L (ref 135–145)

## 2021-11-10 LAB — TROPONIN I (HIGH SENSITIVITY)
Troponin I (High Sensitivity): 6 ng/L (ref <18)
Troponin I (High Sensitivity): 5 ng/L (ref <18)

## 2021-11-10 LAB — TYPE AND SCREEN
ABO/RH(D): A NEG
Antibody Screen: NEGATIVE

## 2021-11-10 LAB — LACTIC ACID, PLASMA
Lactic Acid, Venous: 2.3 mmol/L (ref 0.5–1.9)
Lactic Acid, Venous: 2.2 mmol/L (ref 0.5–1.9)

## 2021-11-10 LAB — BRAIN NATRIURETIC PEPTIDE: B Natriuretic Peptide: 20.1 pg/mL (ref 0.0–100.0)

## 2021-11-10 MED ORDER — SODIUM CHLORIDE 0.9 % IV BOLUS
500.0000 mL | Freq: Once | INTRAVENOUS | Status: AC
Start: 2021-11-10 — End: 2021-11-10
  Administered 2021-11-10: 500 mL via INTRAVENOUS

## 2021-11-10 MED ORDER — FUROSEMIDE 10 MG/ML IJ SOLN
40.0000 mg | Freq: Once | INTRAMUSCULAR | Status: AC
  Administered 2021-11-10: 40 mg via INTRAVENOUS
  Filled 2021-11-10: qty 4

## 2021-11-10 MED ORDER — INSULIN ASPART 100 UNIT/ML IJ SOLN
0.0000 [IU] | Freq: Every day | INTRAMUSCULAR | Status: DC
  Administered 2021-11-10: 4 [IU] via SUBCUTANEOUS
  Filled 2021-11-10: qty 1

## 2021-11-10 MED ORDER — IPRATROPIUM-ALBUTEROL 0.5-2.5 (3) MG/3ML IN SOLN
3.0000 mL | Freq: Once | RESPIRATORY_TRACT | Status: AC
  Administered 2021-11-10: 3 mL via RESPIRATORY_TRACT
  Filled 2021-11-10: qty 3

## 2021-11-10 MED ORDER — INSULIN ASPART 100 UNIT/ML IJ SOLN
0.0000 [IU] | Freq: Three times a day (TID) | INTRAMUSCULAR | Status: DC
  Administered 2021-11-11 (×2): 15 [IU] via SUBCUTANEOUS
  Filled 2021-11-10 (×2): qty 1

## 2021-11-10 MED ORDER — GABAPENTIN 300 MG PO CAPS
300.0000 mg | ORAL_CAPSULE | Freq: Every day | ORAL | Status: DC
  Administered 2021-11-10: 300 mg via ORAL
  Filled 2021-11-10: qty 1

## 2021-11-10 MED ORDER — SIMVASTATIN 10 MG PO TABS: 40.0000 mg | ORAL_TABLET | Freq: Every day | ORAL | Status: DC

## 2021-11-10 MED ORDER — METHYLPREDNISOLONE SODIUM SUCC 125 MG IJ SOLR
125.0000 mg | Freq: Once | INTRAMUSCULAR | Status: AC
  Administered 2021-11-10: 125 mg via INTRAVENOUS
  Filled 2021-11-10: qty 2

## 2021-11-10 MED ORDER — ASPIRIN 81 MG PO TBEC
81.0000 mg | DELAYED_RELEASE_TABLET | Freq: Every day | ORAL | Status: DC
  Administered 2021-11-11: 81 mg via ORAL
  Filled 2021-11-10: qty 1

## 2021-11-10 MED ORDER — HEPARIN SODIUM (PORCINE) 5000 UNIT/ML IJ SOLN
5000.0000 [IU] | Freq: Three times a day (TID) | INTRAMUSCULAR | Status: DC
Start: 2021-11-10 — End: 2021-11-11
  Administered 2021-11-10: 5000 [IU] via SUBCUTANEOUS
  Filled 2021-11-10 (×2): qty 1

## 2021-11-10 MED ORDER — ADULT MULTIVITAMIN W/MINERALS CH
1.0000 | ORAL_TABLET | Freq: Every day | ORAL | Status: DC
  Administered 2021-11-11: 1 via ORAL
  Filled 2021-11-10: qty 1

## 2021-11-10 NOTE — H&P (Signed)
History and Physical    Patient: James Lee OEH:[id number] DOB: 09-23-1950 DOA: 11/10/2021 DOS: the patient was seen and examined on 11/10/2021 PCP: Aura Dials, MD  Patient coming from: Home  Chief Complaint:  Chief Complaint  Patient presents with   Hypotension   Loss of Consciousness   HPI: James Lee is a 71 y.o. male with medical history significant of coronary disease with coronary artery calcification seen on CT scan, non-insulin-dependent diabetes mellitus type 2, essential hypertension, hyperlipidemia, history of prostate cancer, OSA on CPAP, COPD, peripheral neuropathy.  This patient presents to the emergency department having a syncopal episode today. Wife states they were sitting on the back porch and he was a bit more confused and drowsy today.  Feeling weak.  He went unresponsive and was breathing spontaneously and turned pale.  This lasted several minutes and she called 911.  He then came back and regained consciousness.  Initial blood pressure when EMS arrived was 60/30s.  No focal deficits.  No slurred speech.  No facial droop.  No urinary or fecal incontinence.  No tongue biting.  Wife states that this is his third episode of syncope.  Started approximately 3 months ago.  First episode happened when he was standing and he felt unwell and he sat down and passed out.  Second episode he was sitting and then upon standing he passed out.  Third episode as described as above today.  States that he was recently started on a beta-blocker 3 months ago.  Unfortunately it appears he is taking 2 beta-blockers with Bystolic and Toprol-XL.  He does have 2+ edema on exam but family states this is chronic. No previous history of MI or CVA.  Unclear what his baseline creatinine is but follows with nephrology.  Last creatinine from 9/22 was 1.3.  Denies any vomiting.  Denies any diarrhea.  Denies any recent sickness.  No tonic-clonic activity noted by family  Lab work fairly unremarkable.   Mildly elevated lactate likely due to initial hypoperfusion.  VBG unremarkable.  BNP normal.  Creatinine 1.75 but unclear baseline.  Urinalysis is pending.  Troponins unremarkable.  Now normotensive and given IV fluid bolus in the emergency department and treated initially for mild COPD exacerbation due to wheezing noted on exam by ER provider.  Chest x-ray showing mild CHF and given some IV Lasix by the ER provider.  Still lethargic on my exam but easily arousable when you try to talk to him.  Family states this occurs when he is not wearing his CPAP during the day and is taking a nap. CPAP has been ordered.   Review of Systems: As mentioned in the history of present illness. All other systems reviewed and are negative. Past Medical History:  Diagnosis Date   Colon polyps    Coronary artery calcification seen on CAT scan    Diabetes mellitus without complication ([place])    Dyslipidemia    GERD (gastroesophageal reflux disease)    Hyperlipidemia    Hypertension    LBP (low back pain)    Prostate cancer ([place]) 2014   prostate    RBBB    Sleep apnea    c-pap wears   Past Surgical History:  Procedure Laterality Date   ABDOMINAL SURGERY     gastric ulcer   back sugery     1996, 2017, 2018   [facility]   2017, 2018, 2019   gastric ulcers     surgery 11-28-17   INGUINAL HERNIA REPAIR  right   LAPAROSCOPY N/A 11/28/2017   Procedure: LAPAROSCOPY DIAGNOSTIC converted to open;  Surgeon: Ralene Ok, MD;  Location: [facility];  Service: General;  Laterality: N/A;   LAPAROTOMY N/A 11/28/2017   Procedure: EXPLORATORY LAPAROTOMY;  Surgeon: Ralene Ok, MD;  Location: [facility];  Service: General;  Laterality: N/A;   MENISCUS REPAIR Right    POSTERIOR CERVICAL FUSION/FORAMINOTOMY N/A 11/10/2017   Procedure: Posterior cervical fusion with lateral mass fixation Cervical Three-Seven, cervical laminectomy Cervical Three- Seven;  Surgeon: Eustace Moore, MD;  Location: [facility];  Service:  Neurosurgery;  Laterality: N/A;  Posterior cervical fusion with lateral mass fixation Cervical Three-Seven, cervical laminectomy Cervical Three- Seven   PROSTATECTOMY  01/2012   TONSILLECTOMY     TOOTH EXTRACTION     Social History:  reports that he has quit smoking. His smoking use included cigarettes. He started smoking about 9 years ago. He has a 40.00 pack-year smoking history. He has never used smokeless tobacco. He reports current alcohol use. He reports that he does not use drugs.  No Known Allergies  Family History  Problem Relation Age of Onset   Throat cancer Father    Cancer Father    Esophageal cancer Father        ?60 dx - died 71   Coronary artery disease Mother    Prostate cancer Brother    Colon cancer Neg Hx    Rectal cancer Neg Hx    Stomach cancer Neg Hx     Prior to Admission medications   Medication Sig Start Date End Date Taking? Authorizing Provider  amLODipine (NORVASC) 10 MG tablet Take 10 mg by mouth daily.   Yes [provider]  aspirin 81 MG tablet Take 1 tablet (81 mg total) by mouth daily. 03/16/18  Yes Costella, Vista Mink, PA-C  gabapentin (NEURONTIN) 300 MG capsule Take 300 mg by mouth at bedtime.  10/08/15  Yes [provider]  glipiZIDE (GLUCOTROL XL) 5 MG 24 hr tablet Take 5 mg by mouth daily. 11/17/20  Yes [provider]  metFORMIN (GLUCOPHAGE) 500 MG tablet Take 1,000 mg by mouth 2 (two) times daily.    Yes [provider]  metoprolol succinate (TOPROL-XL) 50 MG 24 hr tablet Take 1 tablet (50 mg total) by mouth daily. Take with or immediately following a meal. 04/27/21  Yes Turner, Eber Hong, MD  Multiple Vitamins-Minerals (MULTIVITAMIN WITH MINERALS) tablet Take 1 tablet by mouth daily.   Yes [provider]  nebivolol (BYSTOLIC) 5 MG tablet Take 5 mg by mouth daily. 09/02/17  Yes [provider]  simvastatin (ZOCOR) 40 MG tablet Take 40 mg by mouth daily.    Yes [provider]   valsartan-hydrochlorothiazide (DIOVAN-HCT) 320-25 MG tablet Take 1 tablet by mouth daily. 10/19/15  Yes [provider]  ACCU-CHEK AVIVA PLUS test strip CHECK BLOOD SUGAR IN THE AM BEFORE BREAKFAST AND IN THER EVENING AS NEEDED E11.9 01/03/18   [provider]  NONFORMULARY OR COMPOUNDED ITEM Use as directed 11/30/20   Gardiner Barefoot, DPM    Physical Exam: Vitals:   11/10/21 1630 11/10/21 1700 11/10/21 1730 11/10/21 1756  BP: 124/87 (!) 142/76 122/65   Pulse: 65 69 77   Resp: 19  20   Temp:    98.7 F (37.1 C)  TempSrc:    Oral  SpO2: 100% 100% 91%   Weight:      Height:        General:  Appears calm and  comfortable and is in NAD, lethargic but no focal neurological deficits. Cardiovascular:  RRR, no m/r/g.  Respiratory:   Mild expiratory wheezing but not in any respiratory distress Abdomen:  soft, NT, ND, NABS Skin:  no rash or induration seen on limited exam Musculoskeletal:  grossly normal tone BUE/BLE, good ROM, no bony abnormality Lower extremity:  2+ pitting edema bilateral LEs.  Limited foot exam with no ulcerations.  2+ distal pulses. Psychiatric:  grossly normal mood and affect, speech fluent and appropriate, AOx3 Neurologic:  CN 2-12 grossly intact, moves all extremities in coordinated fashion, sensation intact  Data Reviewed: Hemoglobin 12.5, hematocrit 38.2 lactate 2.3, repeat lactate 2.2, troponin 6, repeat troponin 5, VBG negative.  BNP negative.  Creatinine 1.75.  Assessment and Plan: Syncope: Likely secondary to orthostatic hypotension due to beta-blockers. Holding home beta-blockers.  Orthostatics twice daily. Chart review appears he is only supposed to be on Toprol-XL.  Can reintroduce Toprol-XL when hemodynamics are more stable. Discontinue home Bystolic.  Monitor on telemetry.  Obtain echocardiogram.  Obtain MRI.  Obtain EEG.  Obtain carotid duplex ultrasound.  Obtain UDS.  Essential hypertension: Continue holding home Diovan/HCTZ, Norvasc due to  initial severe orthostatic hypotension. Normotensive now.  Hyperlipidemia: Continue home Zocor  Chronic lower extremity edema: Appears at baseline  Peripheral neuropathy: Continue home gabapentin  Diabetes mellitus type 2: Holding home metformin and glipizide.  Start sliding scale insulin with Accu-Cheks before meals and at bedtime.  OSA: Ordered CPAP  COPD: Not in acute exacerbation  CAD: Continue home aspirin 81 mg daily  Chronic kidney disease: Follows with nephrology.  Unclear stage or baseline creatinine.  Initial creatinine 1.75, last Cr 1.3 from 9/22  Morbid obesity: BMI greater than 35+ DM  Advance Care Planning: Full code  Consults: None  Family Communication: Son, wife and daughter present at bedside  Severity of Illness: The appropriate patient status for this patient is OBSERVATION. Observation status is judged to be reasonable and necessary in order to provide the required intensity of service to ensure the patient's safety. The patient's presenting symptoms, physical exam findings, and initial radiographic and laboratory data in the context of their medical condition is felt to place them at decreased risk for further clinical deterioration. Furthermore, it is anticipated that the patient will be medically stable for discharge from the hospital within 2 midnights of admission.   Author: Leslee Home, DO 11/10/2021 7:27 PM  For on call review [email or web address].

## 2021-11-10 NOTE — ED Provider Notes (Signed)
[facility] Provider Note    Event Date/Time   First MD Initiated Contact with Patient 11/10/21 1526     (approximate)   History   Hypotension and Loss of Consciousness   HPI  James Lee is a 71 y.o. male   history of CAD COPD sleep apnea wears CPAP at night presents to the ER after syncopal event that occurred today.  According to wife there is sitting on the back porch today.  He was a bit more confused and drowsy than normal states he was feeling weak.  He then went unresponsive he was breathing spontaneously but the wife states that he turned pale.  This lasted several minutes while she called 911.  He then came back to.  Still very drowsy.  Was reportedly hypotensive with EMS.  Does feel short of breath has not been on any steroids recently not on any blood thinners.  Has had lower extremity swelling that they are uncertain of etiology denies any history of CHF.  Denies any melena or hematochezia.      Physical Exam   Triage Vital Signs: ED Triage Vitals  Enc Vitals Group     BP 11/10/21 1342 (!) 93/57     Pulse Rate 11/10/21 1342 71     Resp 11/10/21 1342 18     Temp 11/10/21 1342 97.6 F (36.4 C)     Temp Source 11/10/21 1342 Oral     SpO2 11/10/21 1342 93 %     Weight 11/10/21 1339 275 lb (124.7 kg)     Height 11/10/21 1339 '5\' 11"'$  (1.803 m)     Head Circumference --      Peak Flow --      Pain Score 11/10/21 1339 0     Pain Loc --      Pain Edu? --      Excl. in [place]? --     Most recent vital signs: Vitals:   11/10/21 1700 11/10/21 1730  BP: (!) 142/76 122/65  Pulse: 69 77  Resp:  20  Temp:    SpO2: 100% 91%     Constitutional: Alert, ill appearing, mildly tachypnic Eyes: Conjunctivae are normal.  Head: Atraumatic. Nose: No congestion/rhinnorhea. Mouth/Throat: Mucous membranes are moist.   Neck: Painless ROM.  Cardiovascular:   Good peripheral circulation. No m/g/r Respiratory: mild tachypnea with scattered wheeze  throughout Gastrointestinal: Soft and nontender.  Musculoskeletal:  no deformity, ble pitting edema Neurologic:  MAE spontaneously. No gross focal neurologic deficits are appreciated.  Skin:  Skin is warm, dry and intact. No rash noted. Psychiatric: Mood and affect are normal. Speech and behavior are normal.    ED Results / Procedures / Treatments   Labs (all labs ordered are listed, but only abnormal results are displayed) Labs Reviewed  BASIC METABOLIC PANEL - Abnormal; Notable for the following components:      Result Value   Glucose, Bld 176 (*)    Creatinine, Ser 1.75 (*)    Calcium 8.2 (*)    GFR, Estimated 42 (*)    All other components within normal limits  CBC - Abnormal; Notable for the following components:   RBC 4.01 (*)    Hemoglobin 12.5 (*)    HCT 38.2 (*)    All other components within normal limits  LACTIC ACID, PLASMA - Abnormal; Notable for the following components:   Lactic Acid, Venous 2.3 (*)    All other components within normal limits  LACTIC ACID, PLASMA -  Abnormal; Notable for the following components:   Lactic Acid, Venous 2.2 (*)    All other components within normal limits  BLOOD GAS, VENOUS  BRAIN NATRIURETIC PEPTIDE  URINALYSIS, ROUTINE W REFLEX MICROSCOPIC  CBG MONITORING, ED  TYPE AND SCREEN  TROPONIN I (HIGH SENSITIVITY)  TROPONIN I (HIGH SENSITIVITY)     EKG  ED ECG REPORT I, Merlyn Lot, the attending physician, personally viewed and interpreted this ECG.   Date: 11/10/2021  EKG Time: 13:42  Rate: 70  Rhythm: sinus  Axis: normal  Intervals:normal intervals  ST&T Change: no stemi, no depressions    RADIOLOGY Please see ED Course for my review and interpretation.  I personally reviewed all radiographic images ordered to evaluate for the above acute complaints and reviewed radiology reports and findings.  These findings were personally discussed with the patient.  Please see medical record for radiology  report.    PROCEDURES:  Critical Care performed: No  Procedures   MEDICATIONS ORDERED IN ED: Medications  furosemide (LASIX) injection 40 mg (has no administration in time range)  aspirin EC tablet 81 mg (has no administration in time range)  gabapentin (NEURONTIN) capsule 300 mg (has no administration in time range)  multivitamin with minerals tablet 1 tablet (has no administration in time range)  simvastatin (ZOCOR) tablet 40 mg (has no administration in time range)  sodium chloride 0.9 % bolus 500 mL (0 mLs Intravenous Stopped 11/10/21 1612)  ipratropium-albuterol (DUONEB) 0.5-2.5 (3) MG/3ML nebulizer solution 3 mL (3 mLs Nebulization Given 11/10/21 1633)  ipratropium-albuterol (DUONEB) 0.5-2.5 (3) MG/3ML nebulizer solution 3 mL (3 mLs Nebulization Given 11/10/21 1633)  methylPREDNISolone sodium succinate (SOLU-MEDROL) 125 mg/2 mL injection 125 mg (125 mg Intravenous Given 11/10/21 1632)     IMPRESSION / MDM / ASSESSMENT AND PLAN / ED COURSE  I reviewed the triage vital signs and the nursing notes.                              Differential diagnosis includes, but is not limited to, dysrhythmia, dehydration, orthostasis, electrolyte abnormality, anemia, CHF, COPD, ACS  Presenting to the ER for evaluation of symptoms as described above.  Patient not well appearing but protecting his airway not hypoxic.  Hypotensive with EMS receiving IV fluids.  Does have lower extremity edema no history of CHF.  This presenting complaint could reflect a potentially life-threatening illness therefore the patient will be placed on continuous pulse oximetry and telemetry for monitoring.  Laboratory evaluation will be sent to evaluate for the above complaints.      Clinical Course as of 11/10/21 1755  Wed Nov 10, 2021  1619 Chest x-ray by my interpretation does not show any evidence of effusion. [PR]  1727 VBG is normal.  BNP normal.  No acute anemia.  No symptoms of melena.  Feels improved after  nebulizers.  Wheezing improved on repeat exam.  Chest x-ray does have some cardiomegaly and developing edema with his lower extremity swelling now he is normotensive we will give dose of IV Lasix as he does appear a bit volume overloaded.  Given his presentation do feel patient will require hospitalization.  Have consulted hospitalist for admission. [PR]    Clinical Course User Index [PR] Merlyn Lot, MD    Patient's presentation is most consistent with acute presentation with potential threat to life or bodily function.   FINAL CLINICAL IMPRESSION(S) / ED DIAGNOSES   Final diagnoses:  Syncope and collapse  COPD with acute exacerbation ([place])     Rx / DC Orders   ED Discharge Orders     None        Note:  This document was prepared using Dragon voice recognition software and may include unintentional dictation errors.    Merlyn Lot, MD 11/10/21 1755

## 2021-11-10 NOTE — ED Triage Notes (Signed)
Pt to ED via [facility] from home for hypotension and syncope. Per EMS pt was sitting on his porch with his wife and had a syncopal episode. Pts initial blood pressure was 66/30. Pt is on 3 blood pressure medications, no recent changes, no recent illness. Pt given give 1 500 bag in route with a second hanging upon arrival. Pt has hx/o COPD.

## 2021-11-11 ENCOUNTER — Observation Stay: Payer: Medicare Other

## 2021-11-11 DIAGNOSIS — I6523 Occlusion and stenosis of bilateral carotid arteries: Secondary | ICD-10-CM | POA: Diagnosis not present

## 2021-11-11 DIAGNOSIS — I7789 Other specified disorders of arteries and arterioles: Secondary | ICD-10-CM | POA: Diagnosis not present

## 2021-11-11 DIAGNOSIS — R7989 Other specified abnormal findings of blood chemistry: Secondary | ICD-10-CM | POA: Diagnosis not present

## 2021-11-11 DIAGNOSIS — E119 Type 2 diabetes mellitus without complications: Secondary | ICD-10-CM | POA: Diagnosis not present

## 2021-11-11 DIAGNOSIS — I10 Essential (primary) hypertension: Secondary | ICD-10-CM | POA: Diagnosis not present

## 2021-11-11 DIAGNOSIS — E872 Acidosis, unspecified: Secondary | ICD-10-CM

## 2021-11-11 DIAGNOSIS — R55 Syncope and collapse: Secondary | ICD-10-CM | POA: Diagnosis not present

## 2021-11-11 HISTORY — DX: Other specified abnormal findings of blood chemistry: R79.89

## 2021-11-11 HISTORY — DX: Acidosis, unspecified: E87.20

## 2021-11-11 HISTORY — DX: Type 2 diabetes mellitus without complications: E11.9

## 2021-11-11 LAB — BASIC METABOLIC PANEL
Anion gap: 11 (ref 5–15)
BUN: 32 mg/dL — ABNORMAL HIGH (ref 8–23)
CO2: 22 mmol/L (ref 22–32)
Calcium: 8.5 mg/dL — ABNORMAL LOW (ref 8.9–10.3)
Chloride: 103 mmol/L (ref 98–111)
Creatinine, Ser: 1.99 mg/dL — ABNORMAL HIGH (ref 0.61–1.24)
GFR, Estimated: 36 mL/min — ABNORMAL LOW (ref >60)
Glucose, Bld: 388 mg/dL — ABNORMAL HIGH (ref 70–99)
Potassium: 3.8 mmol/L (ref 3.5–5.1)
Sodium: 136 mmol/L (ref 135–145)

## 2021-11-11 LAB — CBC
HCT: 40.1 % (ref 39.0–52.0)
Hemoglobin: 13.1 g/dL (ref 13.0–17.0)
MCH: 30.8 pg (ref 26.0–34.0)
MCHC: 32.7 g/dL (ref 30.0–36.0)
MCV: 94.4 fL (ref 80.0–100.0)
Platelets: 211 10*3/uL (ref 150–400)
RBC: 4.25 MIL/uL (ref 4.22–5.81)
RDW: 12.6 % (ref 11.5–15.5)
WBC: 13.4 10*3/uL — ABNORMAL HIGH (ref 4.0–10.5)
nRBC: 0 % (ref 0.0–0.2)

## 2021-11-11 LAB — HEMOGLOBIN A1C
Hgb A1c MFr Bld: 7 % — ABNORMAL HIGH (ref 4.8–5.6)
Mean Plasma Glucose: 154.2 mg/dL

## 2021-11-11 LAB — CBG MONITORING, ED
Glucose-Capillary: 372 mg/dL — ABNORMAL HIGH (ref 70–99)
Glucose-Capillary: 492 mg/dL — ABNORMAL HIGH (ref 70–99)

## 2021-11-11 LAB — LACTIC ACID, PLASMA: Lactic Acid, Venous: 3.7 mmol/L (ref 0.5–1.9)

## 2021-11-11 MED ORDER — INSULIN GLARGINE-YFGN 100 UNIT/ML ~~LOC~~ SOLN
15.0000 [IU] | Freq: Once | SUBCUTANEOUS | Status: AC
  Administered 2021-11-11: 15 [IU] via SUBCUTANEOUS
  Filled 2021-11-11: qty 0.15

## 2021-11-11 MED ORDER — VALSARTAN-HYDROCHLOROTHIAZIDE 320-25 MG PO TABS: 1.0000 | ORAL_TABLET | Freq: Every day | ORAL | 0 refills | Status: DC

## 2021-11-11 NOTE — Evaluation (Signed)
Occupational Therapy Evaluation Patient Details Name: James Lee MRN: [id number] DOB: 1951-05-12 Today's Date: 11/11/2021   History of Present Illness 71 y.o. male with medical history significant of coronary disease with coronary artery calcification seen on CT scan, non-insulin-dependent diabetes mellitus type 2, essential hypertension, hyperlipidemia, history of prostate cancer, OSA on CPAP, COPD, peripheral neuropathy, and hx of cervical and lumbar back surgeries.   Clinical Impression   Pt was seen for OT evaluation this date. Prior to hospital admission, pt was mod indep with ADL and mobility using SPC for household and short distances, scooter for longer community distances. He does endorse having spouse's assist in the shower due to baseline balance deficits. Pt lives with his spouse in a level entry home with walkin shower. Currently pt demonstrates impairments in balance as described below (See OT problem list) which functionally limits his ability to perform ADL/self-care tasks at optimal independence. Pt near baseline/mod indep with LB dressing during evaluation. Required SBA - CGA for ADL mobility using RW (did not have SPC with him). Pt denied complaints throughout. Not orthostatic (see vitals section for detail). Pt/spouse educated in AE/DME for showering to improve independence and safety. Pt/spouse verbalized understanding. Do not anticipate additional skilled OT needs at this time. Pt may benefit from OP PT referral to address baseline balance deficits for mobility. MD notified. Will sign off.     Recommendations for follow up therapy are one component of a multi-disciplinary discharge planning process, led by the attending physician.  Recommendations may be updated based on patient status, additional functional criteria and insurance authorization.   Follow Up Recommendations  No OT follow up    Assistance Recommended at Discharge Intermittent Supervision/Assistance  Patient can  return home with the following A little help with bathing/dressing/bathroom    Functional Status Assessment  Patient has had a recent decline in their functional status and demonstrates the ability to make significant improvements in function in a reasonable and predictable amount of time.  Equipment Recommendations  Other (comment) (handheld shower head)    Recommendations for Other Services       Precautions / Restrictions Precautions Precautions: Fall Restrictions Weight Bearing Restrictions: No      Mobility Bed Mobility Overal bed mobility: Modified Independent                  Transfers Overall transfer level: Modified independent                 General transfer comment: able to complete without the RW      Balance Overall balance assessment: Needs assistance Sitting-balance support: No upper extremity supported, Feet supported Sitting balance-Leahy Scale: Good     Standing balance support: Reliant on assistive device for balance, Single extremity supported, Bilateral upper extremity supported Standing balance-Leahy Scale: Poor Standing balance comment: requires UE versus BUE support for dynamic balance 2/2 chronic LBP and balance deficits                           ADL either performed or assessed with clinical judgement   ADL Overall ADL's : Modified independent;At baseline                                       General ADL Comments: pt able to complete LB dressing with mod indep using figure 4 technique EOB, appears near baseline for ADL  Vision         Perception     Praxis      Pertinent Vitals/Pain Pain Assessment Pain Assessment: No/denies pain     Hand Dominance     Extremity/Trunk Assessment Upper Extremity Assessment Upper Extremity Assessment: Overall WFL for tasks assessed   Lower Extremity Assessment Lower Extremity Assessment: Overall WFL for tasks assessed   Cervical / Trunk  Assessment Cervical / Trunk Assessment: Other exceptions Cervical / Trunk Exceptions: hx cervical and lumbar surgeries, hx LBP   Communication     Cognition Arousal/Alertness: Awake/alert Behavior During Therapy: WFL for tasks assessed/performed Overall Cognitive Status: Within Functional Limits for tasks assessed                                       General Comments       Exercises Other Exercises Other Exercises: Pt/spouse educated in AE/DME for shower to improve safety/indep and minimize risk of falls including grab bars, handheld shower head   Shoulder Instructions      Home Living Family/patient expects to be discharged to:: Private residence Living Arrangements: Spouse/significant other Available Help at Discharge: Family Type of Home: House Home Access: Level entry     Home Layout: One level     Bathroom Shower/Tub: Occupational psychologist: Standard     Home Equipment: Grab bars - tub/shower;Grab bars - toilet;Shower Land (2 wheels);Cane - single point;Other (comment)   Additional Comments: scooter      Prior Functioning/Environment Prior Level of Function : Needs assist;History of Falls (last six months)             Mobility Comments: SPC for mobility, uses scooter for longer community distances like grocery store ADLs Comments: mod indep with ADL except he and wife shower together wiht intermittent use of shower chair 2/2 balance deficits; 1 fall in past 45mo- unsure what happened        OT Problem List: Impaired balance (sitting and/or standing);Decreased knowledge of use of DME or AE      OT Treatment/Interventions:      OT Goals(Current goals can be found in the care plan section) Acute Rehab OT Goals Patient Stated Goal: go home OT Goal Formulation: All assessment and education complete, DC therapy  OT Frequency:      Co-evaluation              AM-PAC OT "6 Clicks" Daily Activity      Outcome Measure Help from another person eating meals?: None Help from another person taking care of personal grooming?: None Help from another person toileting, which includes using toliet, bedpan, or urinal?: None Help from another person bathing (including washing, rinsing, drying)?: A Little Help from another person to put on and taking off regular upper body clothing?: None Help from another person to put on and taking off regular lower body clothing?: None 6 Click Score: 23   End of Session Equipment Utilized During Treatment: Gait belt;Rolling walker (2 wheels) Nurse Communication: Mobility status  Activity Tolerance: Patient tolerated treatment well Patient left: in bed;with call bell/phone within reach;with family/visitor present  OT Visit Diagnosis: Other abnormalities of gait and mobility (R26.89);History of falling (Z91.81)                Time: 08676-1950OT Time Calculation (min): 25 min Charges:  OT General Charges $OT Visit: 1 Visit OT Evaluation $  OT Eval Low Complexity: 1 Low OT Treatments $Self Care/Home Management : 8-22 mins  Ardeth Perfect., MPH, MS, OTR/L ascom [phone] 11/11/21, 10:11 AM

## 2021-11-11 NOTE — Assessment & Plan Note (Signed)
Likely hypoperfusion in the setting of hypotension and poor clearance due to renal failure.  Low suspicion for infectious process.

## 2021-11-11 NOTE — ED Notes (Signed)
Patient's spouse requesting to speak with MD regarding test results. Pt is also anxious to be discharged. Secure chat sent to MD regarding same.

## 2021-11-11 NOTE — ED Notes (Signed)
AVS with prescriptions provided to and discussed with patient and family member at bedside. Pt verbalizes understanding of discharge instructions and denies any questions or concerns at this time. Pt has ride home. Pt ambulated out of department independently with steady gait using walker.

## 2021-11-11 NOTE — Hospital Course (Addendum)
71 year old M with PMH of CAD, NIDDM-2, COPD, OSA on CPAP and prostate cancer presenting with syncopal episode.  Reportedly hypotensive to 60s/30s when EMS arrived but blood pressure improved with IV fluid.  He was hypotensive to 93/57 on arrival to ED. patient was on multiple antihypertensive medication including 2 beta-blockers (Toprol-XL and Bystolic).  Cr 1.75 (unknown baseline).  BUN 22.  Hgb 12.5.  Lactic acid 2.3>> 2.2.  BNP and serial troponin negative.  EKG features NSR with QTc of 481 but no acute ischemic finding.  UA without significant finding.  UDS positive for cannabinoid.  CXR with some concern about mild CHF.  Lower extremity venous Doppler, carotid ultrasound and MRI brain without acute finding.  Patient was given IV Solu-Medrol, IV Lasix, IV fluid and breathing treatments, and admitted for further evaluation.  Echocardiogram and EEG ordered.   The next day, orthostatic vitals negative.  Patient was up and walking in the room during my visit. He felt well and requested discharge.  Patient's wife felt he is back to his baseline.  She also stated that he had negative cardiac stress test recently.  He has no history of seizure.  We have discontinued Bystolic.  Advised to hold Diovan-HCT at least for 1 week until follow-up with PCP given possible AKI.  He was hyperglycemic likely from steroid.  Patient was screened by therapy prior to discharge, and no need was identified.

## 2021-11-11 NOTE — ED Notes (Signed)
PT at bedside assessing patient.

## 2021-11-11 NOTE — Assessment & Plan Note (Signed)
Unclear if this is AKI or progressive CKD.  Creatinine slightly worse here likely due to hypotension and IV Lasix. -Advised to hold Diovan-HCT for 1 week -Recheck renal function in 1 to 2 weeks

## 2021-11-11 NOTE — ED Notes (Signed)
Pharmacy sent message for STAT Semglee for patient pending discharge.

## 2021-11-11 NOTE — ED Notes (Signed)
Lunch tray delivered to pt at this time.

## 2021-11-11 NOTE — Discharge Summary (Signed)
Physician Discharge Summary  James Lee GEZ:[id number] DOB: 04/18/1951 DOA: 11/10/2021  PCP: Aura Dials, MD  Admit date: 11/10/2021 Discharge date: 11/11/2021 Admitted From: Home Disposition: Home Recommendations for Outpatient Follow-up:  Follow ups as below. Please obtain CBC/BMP/Mag at follow up Please follow up on the following pending results: None  Home Health: Not indicated Equipment/Devices: Not indicated  Discharge Condition: Stable CODE STATUS: Full code  Follow-up Information     Aura Dials, MD. Schedule an appointment as soon as possible for a visit in 1 week(s).   Specialty: Family Medicine Contact information: [place] [address] [facility] course 71 year old M with PMH of CAD, NIDDM-2, COPD, OSA on CPAP and prostate cancer presenting with syncopal episode.  Reportedly hypotensive to 60s/30s when EMS arrived but blood pressure improved with IV fluid.  He was hypotensive to 93/57 on arrival to ED. patient was on multiple antihypertensive medication including 2 beta-blockers (Toprol-XL and Bystolic).  Cr 1.75 (unknown baseline).  BUN 22.  Hgb 12.5.  Lactic acid 2.3>> 2.2.  BNP and serial troponin negative.  EKG features NSR with QTc of 481 but no acute ischemic finding.  UA without significant finding.  UDS positive for cannabinoid.  CXR with some concern about mild CHF.  Lower extremity venous Doppler, carotid ultrasound and MRI brain without acute finding.  Patient was given IV Solu-Medrol, IV Lasix, IV fluid and breathing treatments, and admitted for further evaluation.  Echocardiogram and EEG ordered.   The next day, orthostatic vitals negative.  Patient was up and walking in the room during my visit. He felt well and requested discharge.  Patient's wife felt he is back to his baseline.  She also stated that he had negative cardiac stress test recently.  He has no history of seizure.  We have discontinued  Bystolic.  Advised to hold Diovan-HCT at least for 1 week until follow-up with PCP given possible AKI.  He was hyperglycemic likely from steroid.  Patient was screened by therapy prior to discharge, and no need was identified.    See individual problem list below for more on hospital course.  Problems addressed during this hospitalization Problem  Syncope  Elevated Serum Creatinine  NIDDM-2 with hyperglycemia  Lactic Acidosis  Benign Essential Htn    Assessment and Plan: * Syncope Likely iatrogenic from multiple antihypertensive meds.  Orthostatic vitals negative.  EKG, serial troponin and BNP are reassuring. -Discontinued Bystolic.  Patient to hold Diovan-HCT at least for 1 week.   Lactic acidosis Likely hypoperfusion in the setting of hypotension and poor clearance due to renal failure.  Low suspicion for infectious process.  NIDDM-2 with hyperglycemia Hyperglycemia likely due to steroid.  Patient was given basal insulin 15 units and NovoLog 15 units prior to discharge.  He is on glipizide and metformin at home.  Encouraged good hydration.  Elevated serum creatinine Unclear if this is AKI or progressive CKD.  Creatinine slightly worse here likely due to hypotension and IV Lasix. -Advised to hold Diovan-HCT for 1 week -Recheck renal function in 1 to 2 weeks  Benign essential HTN Was hypotensive on admission.  Normotensive.  Orthostatic vitals negative -Adjusted antihypertensive meds    Vital signs Vitals:   11/11/21 0930 11/11/21 1007 11/11/21 1130 11/11/21 1250  BP: (!) 141/77  137/80 130/80  Pulse: 99  100 97  Temp:   98.2 F (36.8 C) 98.2  F (36.8 C)  Resp: '18  16 16  '$ Height:      Weight:      SpO2: 97% 97% 95% 94%  TempSrc:   Oral Oral  BMI (Calculated):         Discharge exam  GENERAL: No apparent distress.  Nontoxic. HEENT: MMM.  Vision and hearing grossly intact.  NECK: Supple.  No apparent JVD.  RESP:  No IWOB.  Fair aeration bilaterally. CVS:  RRR.  Heart sounds normal.  ABD/GI/GU: BS+. Abd soft, NTND.  MSK/EXT:  Moves extremities. No apparent deformity. No edema.  SKIN: no apparent skin lesion or wound NEURO: Awake and alert. Oriented appropriately.  No apparent focal neuro deficit. PSYCH: Calm. Normal affect.   Discharge Instructions Discharge Instructions     Call MD for:  difficulty breathing, headache or visual disturbances   Complete by: As directed    Call MD for:  extreme fatigue   Complete by: As directed    Call MD for:  persistant dizziness or light-headedness   Complete by: As directed    Call MD for:  severe uncontrolled pain   Complete by: As directed    Diet - low sodium heart healthy   Complete by: As directed    Diet Carb Modified   Complete by: As directed    Discharge instructions   Complete by: As directed    It has been a pleasure taking care of you!  You were hospitalized due to possible syncope (passing out).  We believe this is related to medications for your blood pressure/heart.  We made adjustment to your medications during this hospitalization.  Please review your new medication list and the directions on your medications before you take them.  Follow-up with your primary care doctor in 1 to 2 weeks or sooner if needed.   Take care,   Increase activity slowly   Complete by: As directed       Allergies as of 11/11/2021   No Known Allergies      Medication List     STOP taking these medications    nebivolol 5 MG tablet Commonly known as: BYSTOLIC       TAKE these medications    Accu-Chek Aviva Plus test strip Generic drug: glucose blood CHECK BLOOD SUGAR IN THE AM BEFORE BREAKFAST AND IN THER EVENING AS NEEDED E11.9   amLODipine 10 MG tablet Commonly known as: NORVASC Take 10 mg by mouth daily.   aspirin 81 MG tablet Take 1 tablet (81 mg total) by mouth daily.   gabapentin 300 MG capsule Commonly known as: NEURONTIN Take 300 mg by mouth at bedtime.   glipiZIDE 5 MG 24 hr  tablet Commonly known as: GLUCOTROL XL Take 5 mg by mouth daily.   metFORMIN 500 MG tablet Commonly known as: GLUCOPHAGE Take 1,000 mg by mouth 2 (two) times daily.   metoprolol succinate 50 MG 24 hr tablet Commonly known as: TOPROL-XL Take 1 tablet (50 mg total) by mouth daily. Take with or immediately following a meal.   multivitamin with minerals tablet Take 1 tablet by mouth daily.   NONFORMULARY OR COMPOUNDED ITEM Use as directed   simvastatin 40 MG tablet Commonly known as: ZOCOR Take 40 mg by mouth daily.   valsartan-hydrochlorothiazide 320-25 MG tablet Commonly known as: DIOVAN-HCT Take 1 tablet by mouth daily. Start taking on: November 18, 2021 What changed: These instructions start on November 18, 2021. If you are unsure what to do until then, ask your  doctor or other care provider.        Consultations: None  Procedures/Studies:   MR BRAIN WO CONTRAST  Result Date: 11/10/2021 CLINICAL DATA:  Initial evaluation for acute syncope. EXAM: MRI HEAD WITHOUT CONTRAST TECHNIQUE: Multiplanar, multiecho pulse sequences of the brain and surrounding structures were obtained without intravenous contrast. COMPARISON:  None Available. FINDINGS: Brain: Cerebral volume within normal limits for age. Scattered patchy T2/FLAIR hyperintensity involving the supratentorial cerebral white matter, nonspecific, but most likely related chronic microvascular ischemic disease, moderately advanced in nature. Minimal patchy involvement of the pons noted. No evidence for acute or subacute infarct. No areas of chronic cortical infarction. No acute or chronic intracranial blood products. No mass lesion or midline shift. No hydrocephalus or extra-axial fluid collection. Pituitary gland suprasellar region normal. Midline structures intact and normally formed. Vascular: Major intracranial vascular flow voids are well maintained. Skull and upper cervical spine: Craniocervical junction within normal limits. Bone  marrow signal intensity normal. No scalp soft tissue abnormality. Sinuses/Orbits: Globes and orbital soft tissues within normal limits. Chronic mucosal thickening noted about the ethmoidal air cells and maxillary sinuses. No significant mastoid effusion. Other: None. IMPRESSION: 1. No acute intracranial abnormality. 2. Moderately advanced cerebral white matter disease, most likely related to chronic microvascular ischemic disease. Electronically Signed   By: Jeannine Boga M.D.   On: 11/10/2021 21:52   US Carotid Bilateral  Result Date: 11/11/2021 CLINICAL DATA:  Syncopal episodes. EXAM: BILATERAL CAROTID DUPLEX ULTRASOUND TECHNIQUE: Pearline Cables scale imaging, color Doppler and duplex ultrasound were performed of bilateral carotid and vertebral arteries in the neck. COMPARISON:  Similar study dated 06/10/2020. FINDINGS: Criteria: Quantification of carotid stenosis is based on velocity parameters that correlate the residual internal carotid diameter with NASCET-based stenosis levels, using the diameter of the distal internal carotid lumen as the denominator for stenosis measurement. The following velocity measurements were obtained: RIGHT ICA: 89 cm/sec CCA: 87 cm/sec SYSTOLIC ICA/CCA RATIO:  1.1, previously 0.8 ECA: 113 cm/sec LEFT ICA: 69 cm/sec CCA: 87 cm/sec SYSTOLIC ICA/CCA RATIO:  0.9 previously 0.8 ECA: 96 cm/sec RIGHT CAROTID ARTERY: There is no visible calcific disease. There is a small amount of heterogeneous atheromatous plaque at the carotid bifurcation which is nonstenosing. There is mild intimal thickening in the common carotid artery. RIGHT VERTEBRAL ARTERY: Flow is antegrade with low resistance waveform. LEFT CAROTID ARTERY: There is mild patchy heterogeneous soft plaque in the carotid bulb and bifurcation but it is nonstenosing. There is greater amount of plaque on the left than right and no shadowing calcified plaques identified. There is mild intimal thickening in the common carotid artery. LEFT  VERTEBRAL ARTERY: Flow is antegrade with low resistance waveform. IMPRESSION: Small amounts of heterogeneous plaque on the left more so than right and mild intimal thickening in both common carotid arteries. No hemodynamically significant stenosis is seen by ultrasound. Similar findings were noted in December 2021. Electronically Signed   By: Telford Nab M.D.   On: 11/11/2021 03:12   US Venous Img Lower Bilateral  Result Date: 11/10/2021 CLINICAL DATA:  Right leg swelling EXAM: Right LOWER EXTREMITY VENOUS DOPPLER ULTRASOUND TECHNIQUE: Gray-scale sonography with compression, as well as color and duplex ultrasound, were performed to evaluate the deep venous system(s) from the level of the common femoral vein through the popliteal and proximal calf veins. COMPARISON:  None Available. FINDINGS: VENOUS Normal compressibility of the common femoral, superficial femoral, and popliteal veins, as well as the visualized calf veins. Visualized portions of profunda femoral vein and great  saphenous vein unremarkable. No filling defects to suggest DVT on grayscale or color Doppler imaging. Doppler waveforms show normal direction of venous flow, normal respiratory plasticity and response to augmentation. Limited views of the contralateral common femoral vein are unremarkable. OTHER None. Limitations: none IMPRESSION: Negative. Electronically Signed   By: Franchot Gallo M.D.   On: 11/10/2021 17:46   DG Chest Portable 1 View  Result Date: 11/10/2021 CLINICAL DATA:  Shortness of breath EXAM: PORTABLE CHEST 1 VIEW COMPARISON:  Chest radiograph done on 03/06/2018 and cardiac CT done on 07/01/2021 FINDINGS: Transverse diameter of heart is within normal limits. Central pulmonary vessels are more prominent. There is interval increase in interstitial markings in the parahilar regions. There is no focal consolidation. There is no significant pleural effusion or pneumothorax. IMPRESSION: Central pulmonary vessels are more  prominent, possibly suggesting mild CHF. There is slight prominence of interstitial markings in the parahilar regions which may suggest mild interstitial edema or interstitial pneumonia. There is no focal pulmonary consolidation. Electronically Signed   By: Elmer Picker M.D.   On: 11/10/2021 16:22   US RENAL ARTERY DUPLEX COMPLETE  Result Date: 10/22/2021 CLINICAL DATA:  Chronic kidney disease, stage 3a (HCC) EXAM: RENAL/URINARY TRACT ULTRASOUND RENAL DUPLEX DOPPLER ULTRASOUND COMPARISON:  None Available. FINDINGS: Right Kidney: Length: 11.1 cm. Echogenicity within normal limits. No mass or hydronephrosis visualized. Left Kidney: Length: 11.6 cm. Echogenicity within normal limits. No mass or hydronephrosis visualized. Bladder:  Within normal limits. RENAL DUPLEX ULTRASOUND Right Renal Artery Velocities: Origin:  32 cm/sec Mid:  58 cm/sec Hilum:  102 cm/sec Interlobar:  83 cm/sec Arcuate:  44 cm/sec; normal resistive indices. Left Renal Artery Velocities: Origin:  121 cm/sec Mid:  80 cm/sec Hilum:  68 cm/sec Interlobar:  62 cm/sec Arcuate:  35 cm/sec; normal resistive indices. Aortic Velocity:  82 cm/sec Right Renal-Aortic Ratios: Origin: 0.4 Mid:  0.7 Hilum: 1.2 Interlobar: 1.0 Arcuate: 0.5 Left Renal-Aortic Ratios: Origin: 1.5 Mid: 1.0 Hilum: 0.8 Interlobar: 0.8 Arcuate: 0.4 Renal veins: Bilateral renal veins are patent. IMPRESSION: 1. Normal sonographic appearance of the kidneys.  No hydronephrosis. 2. Normal vascular examination of the kidneys. No findings of renal artery stenosis by Doppler criteria. Electronically Signed   By: Albin Felling M.D.   On: 10/22/2021 10:10       The results of significant diagnostics from this hospitalization (including imaging, microbiology, ancillary and laboratory) are listed below for reference.     Microbiology: No results found for this or any previous visit (from the past 240 hour(s)).   Labs:  CBC: Recent Labs  Lab 11/10/21 1343 11/11/21 0621   WBC 9.5 13.4*  HGB 12.5* 13.1  HCT 38.2* 40.1  MCV 95.3 94.4  PLT 208 211   BMP &GFR Recent Labs  Lab 11/10/21 1343 11/11/21 0621  NA 138 136  K 3.7 3.8  CL 109 103  CO2 22 22  GLUCOSE 176* 388*  BUN 22 32*  CREATININE 1.75* 1.99*  CALCIUM 8.2* 8.5*   Estimated Creatinine Clearance: 47.8 mL/min (A) (by C-G formula based on SCr of 1.99 mg/dL (H)). Liver & Pancreas: No results for input(s): AST, ALT, ALKPHOS, BILITOT, PROT, ALBUMIN in the last 168 hours. No results for input(s): LIPASE, AMYLASE in the last 168 hours. No results for input(s): AMMONIA in the last 168 hours. Diabetic: Recent Labs    11/11/21 0622  HGBA1C 7.0*   Recent Labs  Lab 11/10/21 2233 11/11/21 0748 11/11/21 1137  GLUCAP 313* 372* 492*   Cardiac Enzymes:  No results for input(s): CKTOTAL, CKMB, CKMBINDEX, TROPONINI in the last 168 hours. No results for input(s): PROBNP in the last 8760 hours. Coagulation Profile: No results for input(s): INR, PROTIME in the last 168 hours. Thyroid Function Tests: No results for input(s): TSH, T4TOTAL, FREET4, T3FREE, THYROIDAB in the last 72 hours. Lipid Profile: No results for input(s): CHOL, HDL, LDLCALC, TRIG, CHOLHDL, LDLDIRECT in the last 72 hours. Anemia Panel: No results for input(s): VITAMINB12, FOLATE, FERRITIN, TIBC, IRON, RETICCTPCT in the last 72 hours. Urine analysis:    Component Value Date/Time   COLORURINE STRAW (A) 11/10/2021 1618   APPEARANCEUR CLEAR (A) 11/10/2021 1618   LABSPEC 1.007 11/10/2021 1618   PHURINE 6.0 11/10/2021 1618   GLUCOSEU >=500 (A) 11/10/2021 1618   HGBUR NEGATIVE 11/10/2021 1618   BILIRUBINUR NEGATIVE 11/10/2021 1618   KETONESUR NEGATIVE 11/10/2021 1618   PROTEINUR 100 (A) 11/10/2021 1618   UROBILINOGEN 1.0 11/04/2010 0801   NITRITE NEGATIVE 11/10/2021 1618   LEUKOCYTESUR NEGATIVE 11/10/2021 1618   Sepsis Labs: Invalid input(s): PROCALCITONIN, LACTICIDVEN   Time coordinating discharge: 45  minutes  SIGNED:  Mercy Riding, MD  [facility] 11/11/2021, 6:56 PM

## 2021-11-11 NOTE — ED Notes (Signed)
Per Dr. Cyndia Skeeters, give 15 units Humalog and 15 units Semglee now. No need for STAT lab draw. Pt to be discharged after [facility] is given.

## 2021-11-11 NOTE — Assessment & Plan Note (Signed)
Hyperglycemia likely due to steroid.  Patient was given basal insulin 15 units and NovoLog 15 units prior to discharge.  He is on glipizide and metformin at home.  Encouraged good hydration.

## 2021-11-11 NOTE — Progress Notes (Signed)
PT Cancellation Note  Patient Details Name: James Lee MRN: [id number] DOB: [birth year]   Cancelled Treatment:    Reason Eval/Treat Not Completed: PT screened, no needs identified, will sign off, Patient and wife state he just walked in the hall with OT. Does not feel that he has any needs or equipment at this time.    Jonavin Seder 11/11/2021, 9:58 AM

## 2021-11-11 NOTE — Assessment & Plan Note (Signed)
Likely iatrogenic from multiple antihypertensive meds.  Orthostatic vitals negative.  EKG, serial troponin and BNP are reassuring. -Discontinued Bystolic.  Patient to hold Diovan-HCT at least for 1 week.

## 2021-11-11 NOTE — Assessment & Plan Note (Signed)
Was hypotensive on admission.  Normotensive.  Orthostatic vitals negative -Adjusted antihypertensive meds

## 2021-11-18 DIAGNOSIS — I10 Essential (primary) hypertension: Secondary | ICD-10-CM | POA: Diagnosis not present

## 2021-11-23 DIAGNOSIS — H524 Presbyopia: Secondary | ICD-10-CM | POA: Diagnosis not present

## 2021-12-22 DIAGNOSIS — E1122 Type 2 diabetes mellitus with diabetic chronic kidney disease: Secondary | ICD-10-CM | POA: Diagnosis not present

## 2021-12-22 DIAGNOSIS — R809 Proteinuria, unspecified: Secondary | ICD-10-CM | POA: Diagnosis not present

## 2021-12-22 DIAGNOSIS — N1831 Chronic kidney disease, stage 3a: Secondary | ICD-10-CM | POA: Diagnosis not present

## 2021-12-22 DIAGNOSIS — I129 Hypertensive chronic kidney disease with stage 1 through stage 4 chronic kidney disease, or unspecified chronic kidney disease: Secondary | ICD-10-CM | POA: Diagnosis not present

## 2022-01-12 DIAGNOSIS — G4733 Obstructive sleep apnea (adult) (pediatric): Secondary | ICD-10-CM | POA: Diagnosis not present

## 2022-01-19 ENCOUNTER — Ambulatory Visit
Admission: EM | Admit: 2022-01-19 | Discharge: 2022-01-19 | Disposition: A | Discharge status: Home / Self Care | Payer: Medicare Other | Attending: Family Medicine | Admitting: Family Medicine

## 2022-01-19 ENCOUNTER — Other Ambulatory Visit: Payer: Self-pay

## 2022-01-19 ENCOUNTER — Encounter: Payer: Self-pay | Admitting: *Deleted

## 2022-01-19 DIAGNOSIS — H1033 Unspecified acute conjunctivitis, bilateral: Secondary | ICD-10-CM

## 2022-01-19 MED ORDER — POLYMYXIN B-TRIMETHOPRIM 10000-0.1 UNIT/ML-% OP SOLN: 2.0000 [drp] | Freq: Three times a day (TID) | OPHTHALMIC | 0 refills | Status: AC

## 2022-01-19 NOTE — ED Provider Notes (Addendum)
James Lee    CSN: [id number] Arrival date & time: 01/19/22  1224      History   Chief Complaint Chief Complaint  Patient presents with   Eye Problem    HPI James Lee is a 71 y.o. male.   HPI Patient presents for evaluation of right eye for drainage and itchiness involving bilateral eyes x 3-4 days. The drainage from the right eye is worst compared to the left eye.  He denies any known sick contacts. He endorses copious amounts of drainage draining from both eyes.  He denies any other URI symptoms.   Past Medical History:  Diagnosis Date   Colon polyps    Coronary artery calcification seen on CAT scan    Diabetes mellitus without complication (HCC)    Dyslipidemia    GERD (gastroesophageal reflux disease)    Hyperlipidemia    Hypertension    LBP (low back pain)    Prostate cancer ([name]) 2014   prostate    RBBB    Sleep apnea    c-pap wears    Patient Active Problem List   Diagnosis Date Noted   Elevated serum creatinine 11/11/2021   NIDDM-2 with hyperglycemia 11/11/2021   Lactic acidosis 11/11/2021   Syncope 11/10/2021   RBBB 04/27/2021   Diabetic neuropathy ([name]) 11/30/2020   Hallux malleus 11/30/2020   S/P lumbar spinal fusion 03/09/2018   De Quervain's syndrome (tenosynovitis) 02/05/2018   Perforated bowel ([name]) 11/29/2017   Gastric ulcer 11/29/2017   Cervical vertebral fusion 11/10/2017   Trigger index finger of right hand 09/27/2017   Coronary artery calcification seen on CAT scan 09/02/2014   Benign essential HTN 09/02/2014   Hyperlipemia 09/02/2014   Sleep apnea    Pulmonary nodules 09/01/2014   DOE (dyspnea on exertion) 09/01/2014   Vitamin B12 deficiency (non anemic) 11/06/2012   Scrotal pain 08/16/2012   Reduced libido 08/16/2012   Personal history of prostate cancer 08/16/2012   Male urinary stress incontinence 06/21/2011   Male erectile dysfunction, unspecified 06/21/2011   Cancer of prostate ([name]) 03/08/2011    Past Surgical  History:  Procedure Laterality Date   ABDOMINAL SURGERY     gastric ulcer   back sugery     1996, 2017, 2018   [place]   2017, 2018, 2019   gastric ulcers     surgery 11-28-17   INGUINAL HERNIA REPAIR     right   LAPAROSCOPY N/A 11/28/2017   Procedure: LAPAROSCOPY DIAGNOSTIC converted to open;  Surgeon: Ralene Ok, MD;  Location: [name];  Service: General;  Laterality: N/A;   LAPAROTOMY N/A 11/28/2017   Procedure: EXPLORATORY LAPAROTOMY;  Surgeon: Ralene Ok, MD;  Location: [name];  Service: General;  Laterality: N/A;   MENISCUS REPAIR Right    POSTERIOR CERVICAL FUSION/FORAMINOTOMY N/A 11/10/2017   Procedure: Posterior cervical fusion with lateral mass fixation Cervical Three-Seven, cervical laminectomy Cervical Three- Seven;  Surgeon: Eustace Moore, MD;  Location: [facility];  Service: Neurosurgery;  Laterality: N/A;  Posterior cervical fusion with lateral mass fixation Cervical Three-Seven, cervical laminectomy Cervical Three- Seven   PROSTATECTOMY  01/2012   TONSILLECTOMY     TOOTH EXTRACTION         Home Medications    Prior to Admission medications   Medication Sig Start Date End Date Taking? Authorizing Provider  trimethoprim-polymyxin b (POLYTRIM) ophthalmic solution Place 2 drops into both eyes 3 (three) times daily for 7 days. 01/19/22 01/26/22 Yes Scot Jun, FNP  ACCU-CHEK AVIVA PLUS test strip CHECK BLOOD SUGAR IN THE AM BEFORE BREAKFAST AND IN THER EVENING AS NEEDED E11.9 01/03/18   [provider]  amLODipine (NORVASC) 10 MG tablet Take 10 mg by mouth daily.    [provider]  aspirin 81 MG tablet Take 1 tablet (81 mg total) by mouth daily. 03/16/18   Costella, Vista Mink, PA-C  gabapentin (NEURONTIN) 300 MG capsule Take 300 mg by mouth at bedtime.  10/08/15   [provider]  glipiZIDE (GLUCOTROL XL) 5 MG 24 hr tablet Take 5 mg by mouth daily. 11/17/20   [provider]  metFORMIN (GLUCOPHAGE) 500 MG tablet Take  1,000 mg by mouth 2 (two) times daily.     [provider]  metoprolol succinate (TOPROL-XL) 50 MG 24 hr tablet Take 1 tablet (50 mg total) by mouth daily. Take with or immediately following a meal. 04/27/21   Turner, Eber Hong, MD  Multiple Vitamins-Minerals (MULTIVITAMIN WITH MINERALS) tablet Take 1 tablet by mouth daily.    [provider]  NONFORMULARY OR COMPOUNDED ITEM Use as directed 11/30/20   Gardiner Barefoot, DPM  simvastatin (ZOCOR) 40 MG tablet Take 40 mg by mouth daily.     [provider]  valsartan-hydrochlorothiazide (DIOVAN-HCT) 320-25 MG tablet Take 1 tablet by mouth daily. 11/18/21   Mercy Riding, MD    Family History Family History  Problem Relation Age of Onset   Throat cancer Father    Cancer Father    Esophageal cancer Father        ?47 dx - died 1986-09-14   Coronary artery disease Mother    Prostate cancer Brother    Colon cancer Neg Hx    Rectal cancer Neg Hx    Stomach cancer Neg Hx     Social History Social History   Tobacco Use   Smoking status: Former    Packs/day: 1.00    Years: 40.00    Total pack years: 40.00    Types: Cigarettes    Start date: 03/13/2012   Smokeless tobacco: Never  Vaping Use   Vaping Use: Never used  Substance Use Topics   Alcohol use: Yes    Alcohol/week: 0.0 standard drinks of alcohol    Comment: rarely   Drug use: No     Allergies   Patient has no known allergies.   Review of Systems Review of Systems Pertinent negatives listed in HPI  Physical Exam Triage Vital Signs ED Triage Vitals [01/19/22 1241]  Enc Vitals Group     BP      Pulse      Resp      Temp      Temp src      SpO2      Weight      Height      Head Circumference      Peak Flow      Pain Score 0     Pain Loc      Pain Edu?      Excl. in [place]?    No data found.  Updated Vital Signs BP 124/66   Pulse 79   Temp 98.7 F (37.1 C)   Resp 20   SpO2 96%   Visual Acuity Right Eye Distance:   Left Eye Distance:    Bilateral Distance:    Right Eye Near:   Left Eye Near:    Bilateral Near:     Physical Exam Vitals reviewed.  Constitutional:  Appearance: Normal appearance.  Eyes:     General: Lids are normal.        Right eye: Discharge present.        Left eye: Discharge present.    Extraocular Movements: Extraocular movements intact.     Conjunctiva/sclera:     Right eye: Right conjunctiva is injected. Hemorrhage present.     Left eye: Left conjunctiva is injected. Hemorrhage present.  Cardiovascular:     Rate and Rhythm: Normal rate and regular rhythm.  Pulmonary:     Effort: Pulmonary effort is normal.     Breath sounds: Normal breath sounds.  Musculoskeletal:     Cervical back: Normal range of motion.  Skin:    General: Skin is warm and dry.     Capillary Refill: Capillary refill takes less than 2 seconds.  Neurological:     General: No focal deficit present.     Mental Status: He is alert and oriented to person, place, and time.    UC Treatments / Results  Labs (all labs ordered are listed, but only abnormal results are displayed) Labs Reviewed - No data to display  EKG   Radiology No results found.  Procedures Procedures (including critical care time)  Medications Ordered in UC Medications - No data to display  Initial Impression / Assessment and Plan / UC Course  I have reviewed the triage vital signs and the nursing notes.  Pertinent labs & imaging results that were available during my care of the patient were reviewed by me and considered in my medical decision making (see chart for details).    Treatment per discharge medication orders. Strict return precautions given if symptoms worsen or do not improve. Patient verbalized understanding and agreement with plan. Final Clinical Impressions(s) / UC Diagnoses   Final diagnoses:  Acute bacterial conjunctivitis of both eyes     Discharge Instructions      If your symptoms worsen or do not readily  improve follow-up with your primary care provider or return here for evaluation     ED Prescriptions     Medication Sig Dispense Auth. Provider   trimethoprim-polymyxin b (POLYTRIM) ophthalmic solution Place 2 drops into both eyes 3 (three) times daily for 7 days. 10 mL Scot Jun, FNP      PDMP not reviewed this encounter.   Scot Jun, FNP 01/19/22 1648    Scot Jun, [place] 01/19/22 [phone]

## 2022-01-19 NOTE — ED Triage Notes (Signed)
Pt reports drainage from RT eye for two days anditching to both eyes.

## 2022-01-19 NOTE — Discharge Instructions (Addendum)
If your symptoms worsen or do not readily improve follow-up with your primary care provider or return here for evaluation

## 2022-02-03 DIAGNOSIS — E038 Other specified hypothyroidism: Secondary | ICD-10-CM | POA: Diagnosis not present

## 2022-02-03 DIAGNOSIS — E1142 Type 2 diabetes mellitus with diabetic polyneuropathy: Secondary | ICD-10-CM | POA: Diagnosis not present

## 2022-02-03 DIAGNOSIS — E785 Hyperlipidemia, unspecified: Secondary | ICD-10-CM | POA: Diagnosis not present

## 2022-02-03 DIAGNOSIS — I10 Essential (primary) hypertension: Secondary | ICD-10-CM | POA: Diagnosis not present

## 2022-02-12 DIAGNOSIS — G4733 Obstructive sleep apnea (adult) (pediatric): Secondary | ICD-10-CM | POA: Diagnosis not present

## 2022-02-24 ENCOUNTER — Encounter: Payer: Self-pay | Admitting: Family Medicine

## 2022-02-24 ENCOUNTER — Ambulatory Visit (INDEPENDENT_AMBULATORY_CARE_PROVIDER_SITE_OTHER): Payer: Medicare Other | Admitting: Family Medicine

## 2022-02-24 VITALS — BP 130/82 | HR 81 | Temp 98.3°F | Ht 71.0 in | Wt 261.2 lb

## 2022-02-24 DIAGNOSIS — C61 Malignant neoplasm of prostate: Secondary | ICD-10-CM

## 2022-02-24 DIAGNOSIS — E1142 Type 2 diabetes mellitus with diabetic polyneuropathy: Secondary | ICD-10-CM

## 2022-02-24 DIAGNOSIS — I2510 Atherosclerotic heart disease of native coronary artery without angina pectoris: Secondary | ICD-10-CM

## 2022-02-24 DIAGNOSIS — Z23 Encounter for immunization: Secondary | ICD-10-CM | POA: Diagnosis not present

## 2022-02-24 DIAGNOSIS — Z1159 Encounter for screening for other viral diseases: Secondary | ICD-10-CM | POA: Diagnosis not present

## 2022-02-24 DIAGNOSIS — E1122 Type 2 diabetes mellitus with diabetic chronic kidney disease: Secondary | ICD-10-CM

## 2022-02-24 DIAGNOSIS — I10 Essential (primary) hypertension: Secondary | ICD-10-CM | POA: Diagnosis not present

## 2022-02-24 DIAGNOSIS — Z122 Encounter for screening for malignant neoplasm of respiratory organs: Secondary | ICD-10-CM

## 2022-02-24 DIAGNOSIS — N1832 Chronic kidney disease, stage 3b: Secondary | ICD-10-CM

## 2022-02-24 DIAGNOSIS — Z720 Tobacco use: Secondary | ICD-10-CM

## 2022-02-24 DIAGNOSIS — E7800 Pure hypercholesterolemia, unspecified: Secondary | ICD-10-CM

## 2022-02-24 DIAGNOSIS — E119 Type 2 diabetes mellitus without complications: Secondary | ICD-10-CM

## 2022-02-24 DIAGNOSIS — D509 Iron deficiency anemia, unspecified: Secondary | ICD-10-CM

## 2022-02-24 DIAGNOSIS — E559 Vitamin D deficiency, unspecified: Secondary | ICD-10-CM

## 2022-02-24 DIAGNOSIS — B9689 Other specified bacterial agents as the cause of diseases classified elsewhere: Secondary | ICD-10-CM

## 2022-02-24 DIAGNOSIS — H109 Unspecified conjunctivitis: Secondary | ICD-10-CM

## 2022-02-24 DIAGNOSIS — E538 Deficiency of other specified B group vitamins: Secondary | ICD-10-CM

## 2022-02-24 DIAGNOSIS — E1159 Type 2 diabetes mellitus with other circulatory complications: Secondary | ICD-10-CM

## 2022-02-24 DIAGNOSIS — H1033 Unspecified acute conjunctivitis, bilateral: Secondary | ICD-10-CM

## 2022-02-24 DIAGNOSIS — I152 Hypertension secondary to endocrine disorders: Secondary | ICD-10-CM

## 2022-02-24 DIAGNOSIS — Z136 Encounter for screening for cardiovascular disorders: Secondary | ICD-10-CM

## 2022-02-24 LAB — VITAMIN B12: Vitamin B-12: 494 pg/mL (ref 211–911)

## 2022-02-24 LAB — COMPREHENSIVE METABOLIC PANEL
ALT: 18 U/L (ref 0–53)
AST: 16 U/L (ref 0–37)
Albumin: 4.2 g/dL (ref 3.5–5.2)
Alkaline Phosphatase: 63 U/L (ref 39–117)
BUN: 27 mg/dL — ABNORMAL HIGH (ref 6–23)
CO2: 29 mEq/L (ref 19–32)
Calcium: 10.2 mg/dL (ref 8.4–10.5)
Chloride: 105 mEq/L (ref 96–112)
Creatinine, Ser: 1.8 mg/dL — ABNORMAL HIGH (ref 0.40–1.50)
GFR: 38.11 mL/min — ABNORMAL LOW (ref >60.00)
Glucose, Bld: 123 mg/dL — ABNORMAL HIGH (ref 70–99)
Potassium: 5.1 mEq/L (ref 3.5–5.1)
Sodium: 143 mEq/L (ref 135–145)
Total Bilirubin: 0.5 mg/dL (ref 0.2–1.2)
Total Protein: 6.7 g/dL (ref 6.0–8.3)

## 2022-02-24 LAB — TSH: TSH: 5.42 u[IU]/mL (ref 0.35–5.50)

## 2022-02-24 LAB — LDL CHOLESTEROL, DIRECT: Direct LDL: 80 mg/dL

## 2022-02-24 LAB — CBC
HCT: 45.3 % (ref 39.0–52.0)
Hemoglobin: 15.1 g/dL (ref 13.0–17.0)
MCHC: 33.3 g/dL (ref 30.0–36.0)
MCV: 94.3 fl (ref 78.0–100.0)
Platelets: 228 10*3/uL (ref 150.0–400.0)
RBC: 4.8 Mil/uL (ref 4.22–5.81)
RDW: 14 % (ref 11.5–15.5)
WBC: 10.1 10*3/uL (ref 4.0–10.5)

## 2022-02-24 LAB — VITAMIN D 25 HYDROXY (VIT D DEFICIENCY, FRACTURES): VITD: 15.33 ng/mL — ABNORMAL LOW (ref 30.00–100.00)

## 2022-02-24 LAB — LIPID PANEL
Cholesterol: 141 mg/dL (ref 0–200)
HDL: 28.9 mg/dL — ABNORMAL LOW (ref >39.00)
NonHDL: 111.92
Total CHOL/HDL Ratio: 5
Triglycerides: 342 mg/dL — ABNORMAL HIGH (ref 0.0–149.0)
VLDL: 68.4 mg/dL — ABNORMAL HIGH (ref 0.0–40.0)

## 2022-02-24 LAB — HEMOGLOBIN A1C: Hgb A1c MFr Bld: 8.2 % — ABNORMAL HIGH (ref 4.6–6.5)

## 2022-02-24 MED ORDER — ERYTHROMYCIN 5 MG/GM OP OINT: 1.0000 | TOPICAL_OINTMENT | Freq: Two times a day (BID) | OPHTHALMIC | 0 refills | Status: DC

## 2022-02-24 MED ORDER — OLOPATADINE HCL 0.2 % OP SOLN: OPHTHALMIC | 0 refills | Status: DC

## 2022-02-24 MED ORDER — SHINGRIX 50 MCG/0.5ML IM SUSR: 0.5000 mL | Freq: Once | INTRAMUSCULAR | 0 refills | Status: AC

## 2022-02-24 NOTE — Progress Notes (Unsigned)
    SUBJECTIVE:   CHIEF COMPLAINT / HPI: to establish care  Patient presents to clinic to establish care  HTN Asymptomatic. Tolerating medication well.  Recently had changes made by nephrology.    DM Type 2 Asymptomatic. Reports Ozempic was recently added but financially not able to start.   HLD On statin. Tolerating medication without myalgia  CKD Stage 3b Follow with [facility].  Reports had recent ACR completed.   Red eyes Patient recently seen at Urgent care for bacterial conjunctivitis.  Treated with Polymyxin B drops.  Reports resolved but has now recurred.  Denies any pain, fevers or visual changes.  Endorses eye discharge, stuck together upon awakening and feeling of irritation.    PERTINENT  PMH / PSH:  HTN HLD DM Type 3 CKD 3b OSA Obesity class 3 Tobacco use  OBJECTIVE:   BP 130/82 (BP Location: Left Arm, Patient Position: Sitting, Cuff Size: Large)   Pulse 81   Temp 98.3 F (36.8 C) (Oral)   Ht '5\' 11"'$  (1.803 m)   Wt 261 lb 3.2 oz (118.5 kg)   SpO2 98%   BMI 36.43 kg/m    General: Alert, no acute distress Cardio: Normal S1 and S2, RRR, no r/m/g Pulm: CTAB, normal work of breathing Abdomen: Bowel sounds normal. Abdomen soft and non-tender.  Extremities: No peripheral edema.  Neuro: Cranial nerves grossly intact   ASSESSMENT/PLAN:   Hypertension associated with diabetes ([place]) Not at goal.  Recently had changes made to medications. -Continue Amlodipine 10 mg qhs -Continue Olmesartan 10 mg qhs -Contnue Chlorthalidone 25 mg daily -Continue Metoprolol XL 50 mg daily, plan to switch to Carvedilol at next visit -Labs at next visit -Follow up in 2-3 weeks  Coronary artery calcification seen on CAT scan On statin.  Tolerating medication.  No myalgias -Continue Zocor 40 mg daily  Diabetic neuropathy (HCC) Follows with Podiatry  Continue Gabapentin 300 mg qhs Request patient to schedule yearly Diabetic foot exam  Vitamin B12 deficiency (non  anemic) Check Vit B 12 level today  Hyperlipemia Tolerating medication. No myalgias -Lipids today -Continue Zocor 40 mg daily  Type 2 diabetes mellitus with diabetic chronic kidney disease ([place]) Follows with Nephrology.  Recently added [place] and Ozempic. -Continue Farxiga 10 mg daily -TOC consult for financial assistance with Ozempic  -Continue Metformin 1000 mg BID -Continue Glipizide XL 5 mg daily -Continue statin -Continue ASA -Libre Sensor 2 sample provided -CBC, CMET, TSH, Vit B12, Vit D, Lipids, A1c today -Follow up with Nephrology for CKD Stage 3b, request records from [facility] -Follow up with PCP in 2 weeks  Tobacco use Current tobacco use. 1.5ppd x 50 yrs. -Low dose CT chest for lung cancer screening -abd u/s for AAA screening -Encouraged smoking cessation -Continue to readdress to assess for readiness to quit  Conjunctivitis Erythromycin ointment BID x 5 days Olopatadine drops BID Follow up if no improvement   HCM Follow up with colonoscopy, patient reports thinks every 5 years Low dose CT chest lung cancer screening Abdominal u/s screening for AAA Obtaine records from recent eye exam Hep C screening today Declined HIV PNA 20 vaccine today Flu vaccine today Script sent for Shingrix  PDMP Reviewed  Carollee Leitz, MD

## 2022-02-24 NOTE — Patient Instructions (Addendum)
It was a pleasure meeting you today. Thank you for allowing me to take part in your health care.  Our goals for today as we discussed include:  For your blood pressure Continue Amlodipine 10 mg at night Metoprolol 50 mg at night Chlorthalidone 25 mg daily Benicar 10 mg at night   For your diabetes Continue metformin 1000 mg twice daily Continue Farxiga 10 mg daily   Continue Zocor 40 mg daily for your high cholesterol  For your eye Use erythromycin ointment 1 strip to each eye twice a day for 5 days Use Pataday drops 1-2 drops to each eye daily.   Call you to schedule ultrasound for your abdomen and imaging for your lungs.  Please follow-up with your foot care doctor to have your diabetic foot exam for this year  Please have your eye exam from 04/23 forwarded to the office update your files  We will follow-up with your colonoscopy to see if needing to have completed at 5-year mark.  You received the pneumonia vaccine today. You received the flu vaccine today.  We will send in a prescription for your Shingrix vaccine.  Please follow-up with PCP in 2-3 weeks  If you have any questions or concerns, please do not hesitate to call the office at [phone].  I look forward to our next visit and until then take care and stay safe.  Regards,   Carollee Leitz, MD   [facility]

## 2022-02-25 LAB — HEPATITIS C ANTIBODY: Hepatitis C Ab: NONREACTIVE

## 2022-02-27 ENCOUNTER — Encounter: Payer: Self-pay | Admitting: Family Medicine

## 2022-02-27 DIAGNOSIS — H109 Unspecified conjunctivitis: Secondary | ICD-10-CM

## 2022-02-27 DIAGNOSIS — Z720 Tobacco use: Secondary | ICD-10-CM | POA: Insufficient documentation

## 2022-02-27 DIAGNOSIS — B9689 Other specified bacterial agents as the cause of diseases classified elsewhere: Secondary | ICD-10-CM

## 2022-02-27 DIAGNOSIS — I152 Hypertension secondary to endocrine disorders: Secondary | ICD-10-CM | POA: Insufficient documentation

## 2022-02-27 DIAGNOSIS — E1122 Type 2 diabetes mellitus with diabetic chronic kidney disease: Secondary | ICD-10-CM | POA: Insufficient documentation

## 2022-02-27 HISTORY — DX: Other specified bacterial agents as the cause of diseases classified elsewhere: B96.89

## 2022-02-27 HISTORY — DX: Unspecified conjunctivitis: H10.9

## 2022-02-27 NOTE — Assessment & Plan Note (Signed)
Check Vit B 12 level today

## 2022-02-27 NOTE — Assessment & Plan Note (Signed)
Tolerating medication. No myalgias -Lipids today -Continue Zocor 40 mg daily

## 2022-02-27 NOTE — Assessment & Plan Note (Signed)
Erythromycin ointment BID x 5 days Olopatadine drops BID Follow up if no improvement

## 2022-02-27 NOTE — Assessment & Plan Note (Signed)
On statin.  Tolerating medication.  No myalgias -Continue Zocor 40 mg daily

## 2022-02-27 NOTE — Assessment & Plan Note (Addendum)
Current tobacco use. 1.5ppd x 50 yrs. -Low dose CT chest for lung cancer screening -abd u/s for AAA screening -Encouraged smoking cessation -Continue to readdress to assess for readiness to quit

## 2022-02-27 NOTE — Assessment & Plan Note (Signed)
Follows with Nephrology.  Recently added [place] and Ozempic. -Continue Farxiga 10 mg daily -TOC consult for financial assistance with Ozempic  -Continue Metformin 1000 mg BID -Continue Glipizide XL 5 mg daily -Continue statin -Continue ASA -Libre Sensor 2 sample provided -CBC, CMET, TSH, Vit B12, Vit D, Lipids, A1c today -Follow up with Nephrology for CKD Stage 3b, request records from [place] up with PCP in 2 weeks

## 2022-02-27 NOTE — Assessment & Plan Note (Signed)
Not at goal.  Recently had changes made to medications. -Continue Amlodipine 10 mg qhs -Continue Olmesartan 10 mg qhs -Contnue Chlorthalidone 25 mg daily -Continue Metoprolol XL 50 mg daily, plan to switch to Carvedilol at next visit -Labs at next visit -Follow up in 2-3 weeks

## 2022-02-27 NOTE — Assessment & Plan Note (Addendum)
Follows with Podiatry  Continue Gabapentin 300 mg qhs Request patient to schedule yearly Diabetic foot exam

## 2022-02-28 ENCOUNTER — Other Ambulatory Visit: Payer: Self-pay | Admitting: Family

## 2022-02-28 ENCOUNTER — Other Ambulatory Visit: Payer: Self-pay

## 2022-02-28 MED ORDER — ROSUVASTATIN CALCIUM 10 MG PO TABS: 10.0000 mg | ORAL_TABLET | Freq: Every day | ORAL | 2 refills | Status: DC

## 2022-02-28 NOTE — Progress Notes (Signed)
Spoke to Patient to let him know to stop the Simvastatin and start the Crestor 10 mg. Patient verbalized understanding and is agreeable.

## 2022-03-01 ENCOUNTER — Other Ambulatory Visit: Payer: Self-pay | Admitting: Family Medicine

## 2022-03-09 ENCOUNTER — Telehealth: Payer: Self-pay | Admitting: Family Medicine

## 2022-03-09 NOTE — Telephone Encounter (Signed)
Patient was given a sample of the [place] 2 at last appointment. He has ran out and needs another sample of the [place] 2.

## 2022-03-10 NOTE — Telephone Encounter (Signed)
Spoke to Patient to let him know that a sample Elenor [place] 2 will be at the check in window for him to pick up.

## 2022-03-14 ENCOUNTER — Ambulatory Visit (INDEPENDENT_AMBULATORY_CARE_PROVIDER_SITE_OTHER): Payer: Medicare Other

## 2022-03-14 ENCOUNTER — Encounter: Payer: Self-pay | Admitting: Family

## 2022-03-14 ENCOUNTER — Ambulatory Visit (INDEPENDENT_AMBULATORY_CARE_PROVIDER_SITE_OTHER): Payer: Medicare Other | Admitting: Family

## 2022-03-14 VITALS — BP 140/80 | HR 73 | Temp 98.7°F | Resp 14 | Ht 71.0 in | Wt 264.2 lb

## 2022-03-14 DIAGNOSIS — J40 Bronchitis, not specified as acute or chronic: Secondary | ICD-10-CM | POA: Insufficient documentation

## 2022-03-14 DIAGNOSIS — G4733 Obstructive sleep apnea (adult) (pediatric): Secondary | ICD-10-CM | POA: Diagnosis not present

## 2022-03-14 DIAGNOSIS — R062 Wheezing: Secondary | ICD-10-CM | POA: Diagnosis not present

## 2022-03-14 DIAGNOSIS — J811 Chronic pulmonary edema: Secondary | ICD-10-CM | POA: Diagnosis not present

## 2022-03-14 DIAGNOSIS — R059 Cough, unspecified: Secondary | ICD-10-CM | POA: Diagnosis not present

## 2022-03-14 HISTORY — DX: Bronchitis, not specified as acute or chronic: J40

## 2022-03-14 MED ORDER — PREDNISONE 10 MG PO TABS: ORAL_TABLET | ORAL | 0 refills | Status: DC

## 2022-03-14 MED ORDER — AMOXICILLIN-POT CLAVULANATE 875-125 MG PO TABS: 1.0000 | ORAL_TABLET | Freq: Two times a day (BID) | ORAL | 0 refills | Status: AC

## 2022-03-14 MED ORDER — BENZONATATE 100 MG PO CAPS: 100.0000 mg | ORAL_CAPSULE | Freq: Three times a day (TID) | ORAL | 1 refills | Status: DC | PRN

## 2022-03-14 NOTE — Assessment & Plan Note (Addendum)
No acute respiratory distress.  Wheezing diffusely on exam.  History of smoking.  He is outside the window for antiviral treatment so we have opted not to test him for flu or COVID.  Calculated Crtcl 67 ml/min Based on duration of symptoms, we agreed to start prednisone with Augmentin.  I have also provided with Tessalon Perles.  He will take a probiotic and let me know how he is doing.  Pending chest x-ray to evaluate for PNA

## 2022-03-14 NOTE — Progress Notes (Signed)
Subjective:    Patient ID: James Lee, male    DOB: 10-13-1950, 71 y.o.   MRN: [id number]  CC: James Lee is a 71 y.o. male who presents today for an acute visit.    HPI: Complains of productive cough one week, unchanged.   Some SOB after coughing, which resolves. Endorses wheezing.  Endorses chills, clear runny nose  No fever, chills, bodyaches, sore throat, ear pain .     He has tried [employer] , old rx.   Wife has similar symptoms and she was  negative covid History of PUD, hypertension, diabetes, prostate cancer ,tobacco use, CKD ( GFR 38)  HISTORY:  Past Medical History:  Diagnosis Date   Benign essential HTN 09/02/2014   Colon polyps    Coronary artery calcification seen on CAT scan    Diabetes mellitus without complication ([place])    Dyslipidemia    Gastric ulcer 11/29/2017   GERD (gastroesophageal reflux disease)    Hyperlipidemia    Hypertension    Lactic acidosis 11/11/2021   LBP (low back pain)    NIDDM-2 with hyperglycemia 11/11/2021   Perforated bowel ([place]) 11/29/2017   Prostate cancer ([place]) 2014   prostate    RBBB    Scrotal pain 08/16/2012   Sleep apnea    c-pap wears   Past Surgical History:  Procedure Laterality Date   ABDOMINAL SURGERY     gastric ulcer   back sugery     1996, 2017, 2018   [place]   2017, 2018, 2019   gastric ulcers     surgery 11-28-17   INGUINAL HERNIA REPAIR     right   LAPAROSCOPY N/A 11/28/2017   Procedure: LAPAROSCOPY DIAGNOSTIC converted to open;  Surgeon: Ralene Ok, MD;  Location: [facility];  Service: General;  Laterality: N/A;   LAPAROTOMY N/A 11/28/2017   Procedure: EXPLORATORY LAPAROTOMY;  Surgeon: Ralene Ok, MD;  Location: [facility];  Service: General;  Laterality: N/A;   MENISCUS REPAIR Right    POSTERIOR CERVICAL FUSION/FORAMINOTOMY N/A 11/10/2017   Procedure: Posterior cervical fusion with lateral mass fixation Cervical Three-Seven, cervical laminectomy Cervical Three- Seven;  Surgeon: Eustace Moore, MD;  Location: [facility];  Service: Neurosurgery;  Laterality: N/A;  Posterior cervical fusion with lateral mass fixation Cervical Three-Seven, cervical laminectomy Cervical Three- Seven   PROSTATECTOMY  01/2012   TONSILLECTOMY     TOOTH EXTRACTION     Family History  Problem Relation Age of Onset   Hypertension Mother    Hyperlipidemia Mother    Early death Mother    Drug abuse Mother    Alcohol abuse Mother    Coronary artery disease Mother    Throat cancer Father    Cancer Father    Esophageal cancer Father        ?107 dx - died 48   Diabetes Sister    Depression Sister    Cancer Brother    Prostate cancer Brother    Colon cancer Neg Hx    Rectal cancer Neg Hx    Stomach cancer Neg Hx     Allergies: Patient has no known allergies. Current Outpatient Medications on File Prior to Visit  Medication Sig Dispense Refill   ACCU-CHEK AVIVA PLUS test strip CHECK BLOOD SUGAR IN THE AM BEFORE BREAKFAST AND IN THER EVENING AS NEEDED E11.9  1   amLODipine (NORVASC) 10 MG tablet Take 10 mg by mouth daily.     aspirin 81 MG tablet Take  1 tablet (81 mg total) by mouth daily. 30 tablet 0   chlorthalidone (HYGROTON) 25 MG tablet Take 25 mg by mouth daily.     FARXIGA 10 MG TABS tablet Take 10 mg by mouth daily.     gabapentin (NEURONTIN) 300 MG capsule Take 300 mg by mouth at bedtime.   1   metFORMIN (GLUCOPHAGE) 500 MG tablet Take 1,000 mg by mouth 2 (two) times daily.      metoprolol succinate (TOPROL-XL) 50 MG 24 hr tablet Take 1 tablet (50 mg total) by mouth daily. Take with or immediately following a meal. 90 tablet 3   Multiple Vitamins-Minerals (MULTIVITAMIN WITH MINERALS) tablet Take 1 tablet by mouth daily.     olmesartan (BENICAR) 20 MG tablet Take 10 mg by mouth daily.     Olopatadine HCl 0.2 % SOLN Apply 1-2 drops to both eyes twice a day 2.5 mL 0   rosuvastatin (CRESTOR) 10 MG tablet Take 1 tablet (10 mg total) by mouth daily. 30 tablet 2   glipiZIDE (GLUCOTROL XL) 5 MG  24 hr tablet Take 5 mg by mouth daily. (Patient not taking: Reported on 03/14/2022)     No current facility-administered medications on file prior to visit.    Social History   Tobacco Use   Smoking status: Every Day    Packs/day: 1.00    Years: 40.00    Total pack years: 40.00    Types: Cigarettes    Start date: 03/13/2012   Smokeless tobacco: Never  Vaping Use   Vaping Use: Never used  Substance Use Topics   Alcohol use: Not Currently    Comment: rarely   Drug use: No    Review of Systems  Constitutional:  Negative for chills and fever.  HENT:  Positive for congestion. Negative for sinus pressure and trouble swallowing.   Respiratory:  Positive for cough and wheezing. Negative for chest tightness and shortness of breath.   Cardiovascular:  Negative for chest pain, palpitations and leg swelling.  Gastrointestinal:  Negative for nausea and vomiting.      Objective:    BP (!) 140/80   Pulse 73   Temp 98.7 F (37.1 C) (Oral)   Resp 14   Ht '5\' 11"'$  (1.803 m)   Wt 264 lb 3.2 oz (119.8 kg)   SpO2 95%   BMI 36.85 kg/m   BP Readings from Last 3 Encounters:  03/14/22 (!) 140/80  02/24/22 130/82  01/19/22 124/66    Physical Exam Vitals reviewed.  Constitutional:      Appearance: He is well-developed.  HENT:     Head: Normocephalic and atraumatic.     Right Ear: Hearing, tympanic membrane, ear canal and external ear normal. No decreased hearing noted. No drainage, swelling or tenderness. No middle ear effusion. Tympanic membrane is not injected, erythematous or bulging.     Left Ear: Hearing, tympanic membrane, ear canal and external ear normal. No decreased hearing noted. No drainage, swelling or tenderness.  No middle ear effusion. Tympanic membrane is not injected, erythematous or bulging.     Nose: Nose normal.     Right Sinus: No maxillary sinus tenderness or frontal sinus tenderness.     Left Sinus: No maxillary sinus tenderness or frontal sinus tenderness.      Mouth/Throat:     Pharynx: Uvula midline. No oropharyngeal exudate or posterior oropharyngeal erythema.     Tonsils: No tonsillar abscesses.  Eyes:     Conjunctiva/sclera: Conjunctivae normal.  Cardiovascular:  Rate and Rhythm: Regular rhythm.     Heart sounds: Normal heart sounds.  Pulmonary:     Effort: Pulmonary effort is normal. No respiratory distress.     Breath sounds: Normal breath sounds. No wheezing, rhonchi or rales.  Musculoskeletal:     Right lower leg: No edema.     Left lower leg: No edema.  Lymphadenopathy:     Head:     Right side of head: No submental, submandibular, tonsillar, preauricular, posterior auricular or occipital adenopathy.     Left side of head: No submental, submandibular, tonsillar, preauricular, posterior auricular or occipital adenopathy.     Cervical: No cervical adenopathy.  Skin:    General: Skin is warm and dry.  Neurological:     Mental Status: He is alert.  Psychiatric:        Speech: Speech normal.        Behavior: Behavior normal.        Assessment & Plan:   Problem List Items Addressed This Visit       Respiratory   Bronchitis - Primary    No acute respiratory distress.  Wheezing diffusely on exam.  History of smoking.  He is outside the window for antiviral treatment so we have opted not to test him for flu or COVID.  Calculated Crtcl 67 ml/min Based on duration of symptoms, we agreed to start prednisone with Augmentin.  I have also provided with Tessalon Perles.  He will take a probiotic and let me know how he is doing.  Pending chest x-ray to evaluate for PNA      Relevant Medications   predniSONE (DELTASONE) 10 MG tablet   benzonatate (TESSALON) 100 MG capsule   amoxicillin-clavulanate (AUGMENTIN) 875-125 MG tablet   Other Relevant Orders   DG Chest 2 View      I am having James Lee start on predniSONE, benzonatate, and amoxicillin-clavulanate. I am also having him maintain his amLODipine, multivitamin with  minerals, gabapentin, metFORMIN, Accu-Chek Aviva Plus, aspirin, glipiZIDE, metoprolol succinate, chlorthalidone, Farxiga, olmesartan, Olopatadine HCl, and rosuvastatin.   Meds ordered this encounter  Medications   predniSONE (DELTASONE) 10 MG tablet    Sig: Take 40 mg by mouth on day 1, then taper 10 mg daily until gone    Dispense:  10 tablet    Refill:  0    Order Specific Question:   Supervising Provider    Answer:   Crecencio Mc [[id number]]   benzonatate (TESSALON) 100 MG capsule    Sig: Take 1 capsule (100 mg total) by mouth 3 (three) times daily as needed for cough.    Dispense:  20 capsule    Refill:  1    Order Specific Question:   Supervising Provider    Answer:   Deborra Medina L [[id number]]   amoxicillin-clavulanate (AUGMENTIN) 875-125 MG tablet    Sig: Take 1 tablet by mouth 2 (two) times daily for 7 days.    Dispense:  14 tablet    Refill:  0    Order Specific Question:   Supervising Provider    Answer:   Crecencio Mc [[id number]]    Return precautions given.   Risks, benefits, and alternatives of the medications and treatment plan prescribed today were discussed, and patient expressed understanding.   Education regarding symptom management and diagnosis given to patient on AVS.  Continue to follow with Carollee Leitz, MD for routine health maintenance.   James Lee and I agreed with plan.   Joycelyn Schmid  Vidal Schwalbe, FNP

## 2022-03-14 NOTE — Patient Instructions (Signed)
Please start Augmentin (antibiotic) today. Ensure to take probiotics while on antibiotics and also for 2 weeks after completion. This can either be by eating yogurt daily or taking a probiotic supplement over the counter such as Culturelle.It is important to re-colonize the gut with good bacteria and also to prevent any diarrheal infections associated with antibiotic use.   Tomorrow morning I like for you to start prednisone and make sure that you take all prednisone doses in the morning time as this medication can be quite stimulating and interfere with sleep.  If you feel increased anxiety or palpitation on prednisone, please stop medication immediately.  I have also sent in cough medication called Tessalon Perles.  Please let me know how you are doing

## 2022-03-15 ENCOUNTER — Other Ambulatory Visit: Payer: Self-pay

## 2022-03-15 DIAGNOSIS — Z122 Encounter for screening for malignant neoplasm of respiratory organs: Secondary | ICD-10-CM

## 2022-03-15 DIAGNOSIS — E1122 Type 2 diabetes mellitus with diabetic chronic kidney disease: Secondary | ICD-10-CM

## 2022-03-17 ENCOUNTER — Ambulatory Visit: Payer: Medicare Other | Admitting: Family Medicine

## 2022-03-22 DIAGNOSIS — I1A0 Resistant hypertension: Secondary | ICD-10-CM | POA: Diagnosis not present

## 2022-03-22 DIAGNOSIS — N1831 Chronic kidney disease, stage 3a: Secondary | ICD-10-CM | POA: Diagnosis not present

## 2022-03-22 DIAGNOSIS — E1122 Type 2 diabetes mellitus with diabetic chronic kidney disease: Secondary | ICD-10-CM | POA: Diagnosis not present

## 2022-03-28 DIAGNOSIS — E1122 Type 2 diabetes mellitus with diabetic chronic kidney disease: Secondary | ICD-10-CM | POA: Diagnosis not present

## 2022-04-04 ENCOUNTER — Encounter: Payer: Self-pay | Admitting: Family Medicine

## 2022-04-04 ENCOUNTER — Other Ambulatory Visit: Payer: Self-pay | Admitting: Family Medicine

## 2022-04-04 ENCOUNTER — Other Ambulatory Visit: Payer: Self-pay

## 2022-04-04 ENCOUNTER — Ambulatory Visit (INDEPENDENT_AMBULATORY_CARE_PROVIDER_SITE_OTHER): Payer: Medicare Other | Admitting: Family Medicine

## 2022-04-04 VITALS — BP 138/84 | HR 81 | Temp 97.4°F | Ht 71.0 in | Wt 261.8 lb

## 2022-04-04 DIAGNOSIS — E1159 Type 2 diabetes mellitus with other circulatory complications: Secondary | ICD-10-CM | POA: Diagnosis not present

## 2022-04-04 DIAGNOSIS — E1122 Type 2 diabetes mellitus with diabetic chronic kidney disease: Secondary | ICD-10-CM

## 2022-04-04 DIAGNOSIS — N1832 Chronic kidney disease, stage 3b: Secondary | ICD-10-CM

## 2022-04-04 DIAGNOSIS — E7800 Pure hypercholesterolemia, unspecified: Secondary | ICD-10-CM

## 2022-04-04 DIAGNOSIS — J40 Bronchitis, not specified as acute or chronic: Secondary | ICD-10-CM

## 2022-04-04 DIAGNOSIS — I152 Hypertension secondary to endocrine disorders: Secondary | ICD-10-CM

## 2022-04-04 MED ORDER — GLIPIZIDE ER 5 MG PO TB24: 5.0000 mg | ORAL_TABLET | Freq: Every day | ORAL | 1 refills | Status: DC

## 2022-04-04 MED ORDER — RYBELSUS 3 MG PO TABS: 3.0000 mg | ORAL_TABLET | Freq: Every day | ORAL | 0 refills | Status: DC

## 2022-04-04 NOTE — Progress Notes (Signed)
    SUBJECTIVE:   CHIEF COMPLAINT / HPI: follow up DM  Presents for follow up for . Seen in clinic on 09/14 to establish care.  No changes to medications at that time.  Recommended to start Ozempic at that time as previously prescribed.  Still unable to obtain medication secondary to finances and would like to continue to use oral medications.  Was recently treated for Bronchitis with steroid taper pack. Notes that blood glucose had been elevated in 200's.  Has Libre sensor now.  Had not been taking Glipizide as prescribed.   Reports he has a lot of medications and is confusing to remember which medications are for what.  HTN Asymptomatic.  Has been elevated despite taking medications.  Likely from steroid use.  Tobacco Continued tobacco use   PERTINENT  PMH / PSH:  DM type 2 HTN HLD   OBJECTIVE:   BP 138/84 (BP Location: Left Arm, Patient Position: Sitting, Cuff Size: Large)   Pulse 81   Temp (!) 97.4 F (36.3 C) (Oral)   Ht '5\' 11"'$  (1.803 m)   Wt 261 lb 12.8 oz (118.8 kg)   SpO2 99%   BMI 36.51 kg/m    General: Alert, no acute distress Cardio: Normal S1 and S2, RRR, no r/m/g Pulm: CTAB, normal work of breathing, expiratory wheezing throughout lung fields Abdomen: Bowel sounds normal. Abdomen soft and non-tender.  Extremities: No peripheral edema.    ASSESSMENT/PLAN:   Type 2 diabetes mellitus with diabetic chronic kidney disease (HCC) Asymptomatic. -Restart Glipizide XR 5 mg daily -Start Rybelsus 3 mg daily -Continue Farxiga 10 mg daily -Continue Metformin XR 1000 mg BID -Continue Olmesartan 20 mg daily -Continue Norvasc 10 mg daily -Follows with Nephrology -Will repeat BP at next visit, if continues to remain elevated will likely need additional antihypertensives -Continue Crestor 10 mg daily -Continue ASA 81 mg daily -Follow up in 1 week, bring all meds for review   Hyperlipemia No myalgias Continue Crestor 10 mg daily  Bronchitis Improving. Continue  to have expiratory wheeze. Completed course of steroids and antibiotics.  No shortness of breath and feels better.  Continued tobacco use -Will reassess in 1 week -Strict return precautions provided  Hypertension associated with diabetes ([place]) Elevated today.  Likely secondary to steroid use. -Conintue Norvasc 10 mg daily -Continue Benicar 20 mg daily -Follow up in 1 week for BP check -Continue to follow up with Nephrology as scheduled    Patient to bring in all medications and BP monitor at next visit Referral sent for medication management  Carollee Leitz, MD

## 2022-04-04 NOTE — Patient Instructions (Signed)
It was a pleasure meeting you today. Thank you for allowing me to take part in your health care.  Our goals for today as we discussed include:  For your blood pressure Continue Olmesartan to 10 mg at night Continue Amlodipine 10 mg at night Continue Chlorthalidone 25 mg daily Monitor blood blood pressure at home in a quiet and relaxed area.   For your diabetes Restart Glipizide XR 5 mg with breakfast Continue Metformin 1000 mg two times a day Start Rybelsus 3 mg daily for 30 days, side effects include constipation and nausea.    Please bring in all your medication and your blood pressure monitor at your next visit  Please follow-up with PCP in 1 week  If you have any questions or concerns, please do not hesitate to call the office at [phone].  I look forward to our next visit and until then take care and stay safe.  Regards,   Carollee Leitz, MD   [facility]

## 2022-04-07 ENCOUNTER — Telehealth: Payer: Self-pay

## 2022-04-07 NOTE — Chronic Care Management (AMB) (Signed)
   Care Guide Note  04/07/2022 Name: Damontay Alred MRN: [id number] DOB: February 23, 1951  Referred by: Carollee Leitz, MD Reason for referral : Care Coordination (Outreach to schedule with Pharm D )   Jatinder Mcdonagh is a 71 y.o. year old male who is a primary care patient of Carollee Leitz, MD. Leandrew Keech was referred to the pharmacist for assistance related to HTN and DM.    Successful contact was made with the patient to discuss pharmacy services including being ready for the pharmacist to call at least 5 minutes before the scheduled appointment time, to have medication bottles and any blood sugar or blood pressure readings ready for review. The patient agreed to meet with the pharmacist via with the pharmacist via telephone visit on 04/22/2022.    Noreene Larsson, [place], [place] [address] Direct Dial: [phone] [email or web address]

## 2022-04-10 ENCOUNTER — Encounter: Payer: Self-pay | Admitting: Family Medicine

## 2022-04-10 NOTE — Assessment & Plan Note (Signed)
Improving. Continue to have expiratory wheeze. Completed course of steroids and antibiotics.  No shortness of breath and feels better.  Continued tobacco use -Will reassess in 1 week -Strict return precautions provided

## 2022-04-10 NOTE — Assessment & Plan Note (Signed)
No myalgias Continue Crestor 10 mg daily

## 2022-04-10 NOTE — Assessment & Plan Note (Signed)
Asymptomatic. -Restart Glipizide XR 5 mg daily -Start Rybelsus 3 mg daily -Continue Farxiga 10 mg daily -Continue Metformin XR 1000 mg BID -Continue Olmesartan 20 mg daily -Continue Norvasc 10 mg daily -Follows with Nephrology -Will repeat BP at next visit, if continues to remain elevated will likely need additional antihypertensives -Continue Crestor 10 mg daily -Continue ASA 81 mg daily -Follow up in 1 week, bring all meds for review

## 2022-04-10 NOTE — Assessment & Plan Note (Addendum)
Elevated today.  Likely secondary to steroid use. -Conintue Norvasc 10 mg daily -Continue Benicar 20 mg daily -Follow up in 1 week for BP check -Continue to follow up with Nephrology as scheduled

## 2022-04-11 ENCOUNTER — Ambulatory Visit (INDEPENDENT_AMBULATORY_CARE_PROVIDER_SITE_OTHER): Payer: Medicare Other | Admitting: Family Medicine

## 2022-04-11 ENCOUNTER — Encounter: Payer: Self-pay | Admitting: Family Medicine

## 2022-04-11 VITALS — BP 130/70 | HR 80 | Temp 98.6°F | Ht 71.5 in | Wt 261.6 lb

## 2022-04-11 DIAGNOSIS — E1122 Type 2 diabetes mellitus with diabetic chronic kidney disease: Secondary | ICD-10-CM | POA: Diagnosis not present

## 2022-04-11 DIAGNOSIS — N1832 Chronic kidney disease, stage 3b: Secondary | ICD-10-CM

## 2022-04-11 DIAGNOSIS — Z135 Encounter for screening for eye and ear disorders: Secondary | ICD-10-CM | POA: Diagnosis not present

## 2022-04-11 DIAGNOSIS — Z1211 Encounter for screening for malignant neoplasm of colon: Secondary | ICD-10-CM

## 2022-04-11 DIAGNOSIS — J40 Bronchitis, not specified as acute or chronic: Secondary | ICD-10-CM | POA: Diagnosis not present

## 2022-04-11 DIAGNOSIS — N1831 Chronic kidney disease, stage 3a: Secondary | ICD-10-CM | POA: Diagnosis not present

## 2022-04-11 DIAGNOSIS — I152 Hypertension secondary to endocrine disorders: Secondary | ICD-10-CM

## 2022-04-11 DIAGNOSIS — E1159 Type 2 diabetes mellitus with other circulatory complications: Secondary | ICD-10-CM | POA: Diagnosis not present

## 2022-04-11 MED ORDER — SHINGRIX 50 MCG/0.5ML IM SUSR: 0.5000 mL | Freq: Once | INTRAMUSCULAR | 1 refills | Status: AC

## 2022-04-11 MED ORDER — RSVPREF3 VAC RECOMB ADJUVANTED 120 MCG/0.5ML IM SUSR: 0.5000 mL | Freq: Once | INTRAMUSCULAR | 0 refills | Status: AC

## 2022-04-11 NOTE — Patient Instructions (Addendum)
It was a pleasure meeting you today. Thank you for allowing me to take part in your health care.  Our goals for today as we discussed include:  For your blood pressure Continue current medication Follow up with Nephrology as scheduled  Follow up with Pharmacy to review all medications  Referral sent for colonoscopy  Prescriptions sent to pharmacy for Shingles and RSV vaccine.  Please call [facility] clinic to schedule appointment for upcoming year. Referral sent.    Please have your previous Eye doctor fax results of your most recent eye exam  Please follow-up with PCP in 3 months  If you have any questions or concerns, please do not hesitate to call the office at [phone].  I look forward to our next visit and until then take care and stay safe.  Regards,   Carollee Leitz, MD   [facility]

## 2022-04-11 NOTE — Progress Notes (Signed)
    SUBJECTIVE:   CHIEF COMPLAINT / HPI: follow up blood pressure, medication review, diabetes, bronchitis  Bronchitis Feeling much better.  Has not had to use Albuterol inhaler as frequently.  Cough now back at baseline.  Continues to use tobacco.  Denies any fevers, productive sputum or shortness of breath.  HTN Improving since off steroids.  Did not bring in home BP cuff.  Denies any headaches, visual changes, shortness of breath, chest pain or lower extremity swelling.  Taking Norvasc and Benicar.  Did not bring in all medications today as previously recommended.   Reports taking Norvasc 10 mg, Metoprolol 50 mg, Chlorthalidone 25 mg and Benicar 40 mg daily.  Previously had reports 20 mg of Benicar.    Diabetes Type 2 Asymptomatic.  Reports taking metformin 1000 mg twice a day, Farxiga 10 mg, Glipizide 5 mg.  Recently started Rybelsus and tolerating well.  Did not pick up prescription as was expensive.     PERTINENT  PMH / PSH:  HTN DM Type 2 HLD CKD  OBJECTIVE:   BP 130/70 (BP Location: Right Arm, Patient Position: Sitting, Cuff Size: Normal)   Pulse 80   Temp 98.6 F (37 C) (Oral)   Ht 5' 11.5" (1.816 m)   Wt 261 lb 9.6 oz (118.7 kg)   SpO2 94%   BMI 35.98 kg/m    General: Alert, no acute distress Cardio: Normal S1 and S2, RRR, no r/m/g Pulm: CTAB, normal work of breathing mg  Extremities: No peripheral edema.    ASSESSMENT/PLAN:   Bronchitis resolved  Hypertension associated with diabetes ([place]) Goal <130/80.  Chronic. Asymptomatic.  BP improved since last visit.  Likely steroid induced. -Continue Benicar 40 mg daily -Continue Chlorthalidone 25 mg daily -Continue Metoprolol 50 mg daily -Continue to monitor BP at home  -Referral to [facility] for medication review had been previously placed -Follow up in 3 months -Strict return precautions provided    Type 2 diabetes mellitus with diabetic chronic kidney disease ([place]) Chronic. Goal BP <130/80 Follows  with [facility], Dr Joylene Grapes -Continue Farxiga 10 mg daily -Request patient to have records faxed from Nephrology. -Urine ACR if not already completed, at next visit -Continue to follow up with Nephrology -Continue Metformin  1000 mg  BID -Continue Glipizide 5 mg daily -Continue Rybelsus 3 mg daily, if more expensive than the injectable can switch to Ozempic.   -Referral to pharmacy previously sent to help with finances if possible and medication management of DM/CKD/HTN -Continue Crestor 10 mg daily -Ophthalmology referral -Follow up in 3 months or sooner if needed  HCM Shingrix and Arexvy vaccine prescriptions sent Refer for Colonoscopy Follows with [facility], foot exam due. Recommend follow up with Podiatrist or schedule appointment with PCP to have completed CT low dose for lung cancer screening pending Declined Medicare AWV   Carollee Leitz, MD

## 2022-04-12 ENCOUNTER — Other Ambulatory Visit: Payer: Self-pay

## 2022-04-12 ENCOUNTER — Telehealth: Payer: Self-pay

## 2022-04-12 DIAGNOSIS — Z86010 Personal history of colonic polyps: Secondary | ICD-10-CM

## 2022-04-12 NOTE — Telephone Encounter (Signed)
Gastroenterology Pre-Procedure Review  Request Date: 05/03/22 Requesting Physician: Dr. Vicente Males  PATIENT REVIEW QUESTIONS: The patient responded to the following health history questions as indicated:    1. Are you having any GI issues? no 2. Do you have a personal history of Polyps? yes (last colonoscopy patient stated was performed by Dr. Daisey Must in 2018) 3. Do you have a family history of Colon Cancer or Polyps? no 4. Diabetes Mellitus? yes (patient advised to hold Glipizide 1 day prior to Colonoscopy on 11/20.  Hold Metformin 2 days prior to Colonoscopy on 11/19.  Hold Semaglutide (Rybelsus) 3 days prior to Colonoscopy on 11/18) 5. Joint replacements in the past 12 months?no 6. Major health problems in the past 3 months?no 7. Any artificial heart valves, MVP, or defibrillator?no    MEDICATIONS & ALLERGIES:    Patient reports the following regarding taking any anticoagulation/antiplatelet therapy:   Plavix, Coumadin, Eliquis, Xarelto, Lovenox, Pradaxa, Brilinta, or Effient? no Aspirin? Yes '81mg'$  daily  Patient confirms/reports the following medications:  Current Outpatient Medications  Medication Sig Dispense Refill   amLODipine (NORVASC) 10 MG tablet Take 10 mg by mouth daily.     aspirin 81 MG tablet Take 1 tablet (81 mg total) by mouth daily. 30 tablet 0   chlorthalidone (HYGROTON) 25 MG tablet Take 25 mg by mouth daily.     FARXIGA 10 MG TABS tablet Take 10 mg by mouth daily.     gabapentin (NEURONTIN) 300 MG capsule Take 300 mg by mouth at bedtime.   1   glipiZIDE (GLUCOTROL XL) 5 MG 24 hr tablet TAKE 1 TABLET(5 MG) BY MOUTH DAILY 90 tablet 0   metFORMIN (GLUCOPHAGE) 500 MG tablet Take 1,000 mg by mouth 2 (two) times daily.      metoprolol succinate (TOPROL-XL) 50 MG 24 hr tablet Take 1 tablet (50 mg total) by mouth daily. Take with or immediately following a meal. 90 tablet 3   Multiple Vitamins-Minerals (MULTIVITAMIN WITH MINERALS) tablet Take 1 tablet by mouth daily.      olmesartan (BENICAR) 20 MG tablet Take 10 mg by mouth daily.     rosuvastatin (CRESTOR) 10 MG tablet Take 1 tablet (10 mg total) by mouth daily. 30 tablet 2   Semaglutide (RYBELSUS) 3 MG TABS Take 3 mg by mouth daily. 30 tablet 0   No current facility-administered medications for this visit.    Patient confirms/reports the following allergies:  No Known Allergies  No orders of the defined types were placed in this encounter.   AUTHORIZATION INFORMATION Primary Insurance: 1D#: Group #:  Secondary Insurance: 1D#: Group #:  SCHEDULE INFORMATION: Date: 05/03/22 Time: Location: [place]

## 2022-04-13 NOTE — Telephone Encounter (Signed)
Amlodipine was sent to Pharmacy on 04/05/22

## 2022-04-22 ENCOUNTER — Other Ambulatory Visit: Payer: Medicare Other | Admitting: Pharmacist

## 2022-04-24 ENCOUNTER — Encounter: Payer: Self-pay | Admitting: Family Medicine

## 2022-04-24 MED ORDER — OLMESARTAN MEDOXOMIL 40 MG PO TABS: 40.0000 mg | ORAL_TABLET | Freq: Every day | ORAL | Status: DC

## 2022-04-24 NOTE — Assessment & Plan Note (Addendum)
Chronic. Goal BP <130/80 Follows with [facility], Dr Joylene Grapes -Continue Farxiga 10 mg daily -Request patient to have records faxed from Nephrology. -Urine ACR if not already completed, at next visit -Continue to follow up with Nephrology -Continue Metformin  1000 mg  BID -Continue Glipizide 5 mg daily -Continue Rybelsus 3 mg daily, if more expensive than the injectable can switch to Ozempic.   -Referral to pharmacy previously sent to help with finances if possible and medication management of DM/CKD/HTN -Continue Crestor 10 mg daily -Ophthalmology referral -Follow up in 3 months or sooner if needed

## 2022-04-24 NOTE — Assessment & Plan Note (Signed)
resolved 

## 2022-04-24 NOTE — Assessment & Plan Note (Addendum)
Goal <130/80.  Chronic. Asymptomatic.  BP improved since last visit.  Likely steroid induced. -Continue Benicar 40 mg daily -Continue Chlorthalidone 25 mg daily -Continue Metoprolol 50 mg daily -Continue to monitor BP at home  -Referral to [facility] for medication review had been previously placed -Follow up in 3 months -Strict return precautions provided

## 2022-04-26 ENCOUNTER — Telehealth: Payer: Self-pay

## 2022-04-26 ENCOUNTER — Other Ambulatory Visit: Payer: Self-pay | Admitting: Family Medicine

## 2022-04-26 ENCOUNTER — Other Ambulatory Visit: Payer: Medicare Other | Admitting: Pharmacist

## 2022-04-26 MED ORDER — RYBELSUS 7 MG PO TABS: 7.0000 mg | ORAL_TABLET | Freq: Every day | ORAL | Status: DC

## 2022-04-26 NOTE — Progress Notes (Signed)
04/26/2022 Name: James Lee MRN: [id number] DOB: 1950/12/21  Chief Complaint  Patient presents with   Medication Management   Diabetes   Hypertension   Hyperlipidemia    James Lee is a 71 y.o. year old male who presented for a telephone visit.   They were referred to the pharmacist by their PCP for assistance in managing diabetes, hypertension, and hyperlipidemia.    Subjective:  Care Team: Primary Care Provider: Carollee Leitz, MD ; Next Scheduled Visit: 07/12/22  Neph: James Lee -  Cards: James Lee  Medication Access/Adherence  Current Pharmacy:  [facility] [place] [place] [address] Phone: [phone] Fax: [phone]   Patient reports affordability concerns with their medications: Yes  Patient reports access/transportation concerns to their pharmacy: No  Patient reports adherence concerns with their medications:  No     Diabetes:  Current medications: metformin XR 1000 mg twice daily, Farxiga 10 mg daily, glipizide XL 5 mg daily; Rybelsus 3 mg daily  Reports significant improvement in blood sugars since starting Rybelsus   Date of Download: 11/1-11/14/23 % Time CGM is active: 56% Average Glucose: 162 mg/dL Glucose Management Indicator: 7.2  Glucose Variability: 22.4 (goal <36%) Time in Goal:  - Time in range 70-180: 74% - Time above range: 26% - Time below range: 0%  Patient denies hypoglycemic s/sx including dizziness, shakiness, sweating.   Most recent eGFR <45, however, he reports he had labs done last month at [facility].   Hypertension:  Current medications: olmesartan 40 mg daily- though 20 mg was most recently filled; amlodipine 10 mg daily, chlorthalidone 25 mg daily, metoprolol succinate 50 mg daily   Patient has a validated, automated, upper arm home BP cuff but has not been checking recently.   Hyperlipidemia/ASCVD Risk  Reduction  Current lipid lowering medications: rosuvastatin 10 mg daily   Antiplatelet regimen: aspirin 81 mg daily    Health Maintenance  Health Maintenance Due  Topic Date Due   Medicare Annual Wellness (AWV)  Never done   OPHTHALMOLOGY EXAM  Never done   Diabetic kidney evaluation - Urine ACR  Never done   COLONOSCOPY (Pts 45-85yr Insurance coverage will need to be confirmed)  Never done   Lung Cancer Screening  01/28/2016   Zoster Vaccines- Shingrix (2 of 2) 12/23/2016   COVID-19 Vaccine (7 - Mixed Product risk series) 05/26/2020   FOOT EXAM  11/30/2021     Objective: Lab Results  Component Value Date   HGBA1C 8.2 (H) 02/24/2022    Lab Results  Component Value Date   CREATININE 1.80 (H) 02/24/2022   BUN 27 (H) 02/24/2022   NA 143 02/24/2022   K 5.1 02/24/2022   CL 105 02/24/2022   CO2 29 02/24/2022    Lab Results  Component Value Date   CHOL 141 02/24/2022   HDL 28.90 (L) 02/24/2022   LDLCALC 75 04/27/2021   LDLDIRECT 80.0 02/24/2022   TRIG 342.0 (H) 02/24/2022   CHOLHDL 5 02/24/2022    Medications Reviewed Today     Reviewed by HOsker Mason RPH-CPP (Pharmacist) on 04/26/22 at 0Olivia Lopez de GutierrezList Status: <None>   Medication Order Taking? Sig Documenting Provider Last Dose Status Informant  amLODipine (NORVASC) 5 MG tablet [id number] Take 5 mg by mouth daily. [provider] Taking Active Self  aspirin 81 MG tablet [id number] Take 1 tablet (81 mg total) by mouth daily.  Costella, Vista Mink, PA-C Taking Active Self  chlorthalidone (HYGROTON) 25 MG tablet [id number] Yes Take 25 mg by mouth daily. [provider] Taking Active   docusate sodium (COLACE) 100 MG capsule [id number] Yes Take 100 mg by mouth 2 (two) times daily. [provider] Taking Active   FARXIGA 10 MG TABS tablet [id number] Yes Take 10 mg by mouth daily. [provider] Taking Active   gabapentin (NEURONTIN) 300 MG capsule [id number] Yes Take 600 mg by  mouth 2 (two) times daily. [provider] Taking Active Self           Med Note Caryn Section, Marylyn Ishihara A   Thu Nov 12, 2015  9:17 AM)    glipiZIDE (GLUCOTROL XL) 5 MG 24 hr tablet [id number] Yes TAKE 1 TABLET(5 MG) BY MOUTH DAILY Carollee Leitz, MD Taking Active   metFORMIN (GLUCOPHAGE-XR) 500 MG 24 hr tablet [id number] Yes Take 1,000 mg by mouth 2 (two) times daily with a meal. [provider] Taking Active   metoprolol succinate (TOPROL-XL) 50 MG 24 hr tablet [id number] Yes Take 1 tablet (50 mg total) by mouth daily. Take with or immediately following a meal. James Lee, Eber Hong, MD Taking Active Self  Multiple Vitamins-Minerals (MULTIVITAMIN WITH MINERALS) tablet [id number] Yes Take 1 tablet by mouth daily. [provider] Taking Active Self  olmesartan (BENICAR) 40 MG tablet [id number] Yes Take 1 tablet (40 mg total) by mouth daily. Carollee Leitz, MD Taking Active   rosuvastatin (CRESTOR) 10 MG tablet [id number] No Take 1 tablet (10 mg total) by mouth daily.  Patient not taking: Reported on 04/26/2022   James Arnold, FNP Not Taking Active   Semaglutide [facility]) 3 MG TABS [id number] Yes Take 3 mg by mouth daily. Carollee Leitz, MD Taking Active               Assessment/Plan:   Diabetes: - Currently uncontrolled - Reviewed goal A1c, goal fasting, and goal 2 hour post prandial glucose - Contacted [facility] to review most recent eGFR; Dr. Joylene Lee nurse reports it was 66. This is appropriate to continue current dose of metformin.  - Continue Rybelsus 3 mg, metformin XR 1000 mg twice daily, Farxiga 10 mg, glipizide XL 5 mg daily at this time. Discussed income. Patient qualifies for Rybelsus assistance, but not [place] assistance. Will collaborate with clinical staff to complete assistance for PAP for Rybelsus 7 mg. Goal to eliminate glipizide moving forward. Will collaborate with PCP to place order for Rybelsus 7 mg with No Print.  Hypertension: - Currently controlled per last  office reading, however, unclear what dose of olmesartan patient is supposed to be on - [facility]. They had recently reduced him to olmesartan 20 mg daily. Notified patient to take the 20 mg tablet strength that he recently picked up from the pharmacy and check home BP readings.  - Reviewed to continue chlorthalidone, amlodipine, and metoprolol as prescribed.    Hyperlipidemia/ASCVD Risk Reduction: - Currently uncontrolled but could be impacted by medication noncompliance.  - Recommend to restart rosuvastatin 10 mg daily. Patient verbalizes understanding.   Follow Up Plan: phone call in 6 weeks  Catie TJodi Mourning, PharmD, [facility] [phone]

## 2022-04-26 NOTE — Telephone Encounter (Signed)
Medication Samples have been provided to the patient.  Drug name: Rybelsus       Strength: '3mg'$         Qty: 30  LOT: [id number]  Exp.Date: 05/13/2023  Dosing instructions: Take 1 tablet by mouth daily.   The patient has been instructed regarding the correct time, dose, and frequency of taking this medication, including desired effects and most common side effects.   Ferne Reus 3:25 PM 04/26/2022

## 2022-04-27 DIAGNOSIS — E1122 Type 2 diabetes mellitus with diabetic chronic kidney disease: Secondary | ICD-10-CM | POA: Diagnosis not present

## 2022-05-02 ENCOUNTER — Other Ambulatory Visit: Payer: Self-pay | Admitting: *Deleted

## 2022-05-02 ENCOUNTER — Encounter: Payer: Self-pay | Admitting: Gastroenterology

## 2022-05-02 ENCOUNTER — Other Ambulatory Visit: Payer: Self-pay

## 2022-05-02 ENCOUNTER — Telehealth: Payer: Self-pay

## 2022-05-02 ENCOUNTER — Telehealth: Payer: Self-pay | Admitting: Gastroenterology

## 2022-05-02 DIAGNOSIS — Z87891 Personal history of nicotine dependence: Secondary | ICD-10-CM

## 2022-05-02 DIAGNOSIS — Z122 Encounter for screening for malignant neoplasm of respiratory organs: Secondary | ICD-10-CM

## 2022-05-02 DIAGNOSIS — F17210 Nicotine dependence, cigarettes, uncomplicated: Secondary | ICD-10-CM

## 2022-05-02 MED ORDER — NA SULFATE-K SULFATE-MG SULF 17.5-3.13-1.6 GM/177ML PO SOLN: 1.0000 | Freq: Once | ORAL | 0 refills | Status: AC

## 2022-05-02 MED ORDER — PEG 3350-KCL-NA BICARB-NACL 420 G PO SOLR: 4000.0000 mL | Freq: Once | ORAL | 0 refills | Status: AC

## 2022-05-02 NOTE — Addendum Note (Signed)
Addended by: Vanetta Mulders on: 05/02/2022 10:18 AM   Modules accepted: Orders

## 2022-05-02 NOTE — Telephone Encounter (Signed)
Mailed [employer] application to patient home for Rybelsus   Please have office staff fax to [phone] ATTENTION Nahima Ales. Patient will be returning to office.   Sandre [facility] Patient Advocate

## 2022-05-02 NOTE — Telephone Encounter (Signed)
Patient states his bowel prep was not set to his pharmacy. He is asking if you can send it to [facility] on [address]. His colonoscopy is in the morning. Patient is asking that he gets a call back when it has been sent in.

## 2022-05-02 NOTE — Telephone Encounter (Signed)
Suprep was sent to pharmacy this morning, however it is not preferred by patients insurance.  Informed patient that his prep has been changed to Nulytely prep.  Instructed patient to fill to the fill line with clear liquid, mix prep well.  Drinking 8 oz every 30 mins until entire contents have been completed.  If he starts to experience any nausea advised him to stop drinking it for a bit then resume.  Patient verbalized understanding.  Thanks,  [place], [place]

## 2022-05-03 ENCOUNTER — Ambulatory Visit: Payer: Medicare Other | Admitting: Anesthesiology

## 2022-05-03 ENCOUNTER — Ambulatory Visit
Admission: RE | Admit: 2022-05-03 | Discharge: 2022-05-03 | Disposition: A | Discharge status: Home / Self Care | Payer: Medicare Other | Attending: Gastroenterology | Admitting: Gastroenterology

## 2022-05-03 ENCOUNTER — Encounter
Admission: RE | Disposition: A | Discharge status: Home / Self Care | Payer: Self-pay | Source: Home / Self Care | Attending: Gastroenterology

## 2022-05-03 ENCOUNTER — Encounter: Payer: Self-pay | Admitting: Gastroenterology

## 2022-05-03 ENCOUNTER — Other Ambulatory Visit: Payer: Self-pay

## 2022-05-03 DIAGNOSIS — K635 Polyp of colon: Secondary | ICD-10-CM | POA: Insufficient documentation

## 2022-05-03 DIAGNOSIS — Z860100 Personal history of colon polyps, unspecified: Secondary | ICD-10-CM

## 2022-05-03 DIAGNOSIS — F17210 Nicotine dependence, cigarettes, uncomplicated: Secondary | ICD-10-CM | POA: Diagnosis not present

## 2022-05-03 DIAGNOSIS — I10 Essential (primary) hypertension: Secondary | ICD-10-CM | POA: Insufficient documentation

## 2022-05-03 DIAGNOSIS — Z8711 Personal history of peptic ulcer disease: Secondary | ICD-10-CM | POA: Diagnosis not present

## 2022-05-03 DIAGNOSIS — Z7984 Long term (current) use of oral hypoglycemic drugs: Secondary | ICD-10-CM | POA: Insufficient documentation

## 2022-05-03 DIAGNOSIS — E119 Type 2 diabetes mellitus without complications: Secondary | ICD-10-CM | POA: Diagnosis not present

## 2022-05-03 DIAGNOSIS — I2510 Atherosclerotic heart disease of native coronary artery without angina pectoris: Secondary | ICD-10-CM | POA: Insufficient documentation

## 2022-05-03 DIAGNOSIS — Z6835 Body mass index (BMI) 35.0-35.9, adult: Secondary | ICD-10-CM | POA: Diagnosis not present

## 2022-05-03 DIAGNOSIS — R0600 Dyspnea, unspecified: Secondary | ICD-10-CM | POA: Diagnosis not present

## 2022-05-03 DIAGNOSIS — Z1211 Encounter for screening for malignant neoplasm of colon: Secondary | ICD-10-CM | POA: Diagnosis not present

## 2022-05-03 HISTORY — DX: Personal history of colonic polyps: Z86.010

## 2022-05-03 HISTORY — PX: COLONOSCOPY WITH PROPOFOL: SHX5780

## 2022-05-03 HISTORY — DX: Personal history of colon polyps, unspecified: Z86.0100

## 2022-05-03 LAB — GLUCOSE, CAPILLARY: Glucose-Capillary: 122 mg/dL — ABNORMAL HIGH (ref 70–99)

## 2022-05-03 SURGERY — COLONOSCOPY WITH PROPOFOL
Anesthesia: General

## 2022-05-03 MED ORDER — SODIUM CHLORIDE 0.9 % IV SOLN
INTRAVENOUS | Status: DC
  Administered 2022-05-03: 1000 mL via INTRAVENOUS

## 2022-05-03 MED ORDER — PROPOFOL 10 MG/ML IV BOLUS
INTRAVENOUS | Status: DC | PRN
  Administered 2022-05-03: 50 mg via INTRAVENOUS

## 2022-05-03 MED ORDER — LIDOCAINE HCL (CARDIAC) PF 100 MG/5ML IV SOSY
PREFILLED_SYRINGE | INTRAVENOUS | Status: DC | PRN
  Administered 2022-05-03: 100 mg via INTRAVENOUS

## 2022-05-03 MED ORDER — PROPOFOL 500 MG/50ML IV EMUL
INTRAVENOUS | Status: DC | PRN
  Administered 2022-05-03: 140 ug/kg/min via INTRAVENOUS

## 2022-05-03 NOTE — Op Note (Signed)
[facility] Patient Name: James Lee Procedure Date: 05/03/2022 10:15 AM MRN: [id number] Account #: [id number] Date of Birth: Oct 23, 1950 Admit Type: Outpatient Age: 70 Room: [facility] ENDO ROOM 2 Gender: Male Note Status: Finalized Instrument Name: Park Meo [id number] Procedure:             Colonoscopy Indications:           Surveillance: Personal history of colonic polyps                         (unknown histology) on last colonoscopy 5 years ago Providers:             [name] MD, MD Medicines:             Monitored Anesthesia Care Complications:         No immediate complications. Procedure:             Pre-Anesthesia Assessment:                        - Prior to the procedure, a History and Physical was                         performed, and patient medications, allergies and                         sensitivities were reviewed. The patient's tolerance                         of previous anesthesia was reviewed.                        - The risks and benefits of the procedure and the                         sedation options and risks were discussed with the                         patient. All questions were answered and informed                         consent was obtained.                        - ASA Grade Assessment: II - A patient with mild                         systemic disease.                        After obtaining informed consent, the colonoscope was                         passed under direct vision. Throughout the procedure,                         the patient's blood pressure, pulse, and oxygen                         saturations were monitored continuously. The  Colonoscope was introduced through the anus and                         advanced to the the cecum, identified by the                         appendiceal orifice. The colonoscopy was performed                         with ease. The patient tolerated  the procedure well.                         The quality of the bowel preparation was inadequate.                         The appendiceal orifice was photographed. Findings:      Four semi-pedunculated polyps were found in the ascending colon. The       polyps were large in size. Polypectomy was not attempted due to       inadequate bowel preparation. Impression:            - Preparation of the colon was inadequate.                        - Four large polyps in the ascending colon. Resection                         not attempted.                        - No specimens collected. Recommendation:        - Repeat colonoscopy tomorrow because the bowel                         preparation was suboptimal. Procedure Code(s):     --- Professional ---                        [phone], Colonoscopy, flexible; diagnostic, including                         collection of specimen(s) by brushing or washing, when                         performed (separate procedure) Diagnosis Code(s):     --- Professional ---                        Z86.010, Personal history of colonic polyps                        D12.2, Benign neoplasm of ascending colon CPT copyright 2022 American Medical Association. All rights reserved. The codes documented in this report are preliminary and upon coder review may  be revised to meet current compliance requirements. [name], MD [name] MD, MD 05/03/2022 10:40:10 AM This report has been signed electronically. Number of Addenda: 0 Note Initiated On: 05/03/2022 10:15 AM Total Procedure Duration: 0 hours 3 minutes 30 seconds  Estimated Blood Loss:  Estimated blood loss: none.      [facility]

## 2022-05-03 NOTE — Anesthesia Procedure Notes (Signed)
Date/Time: 05/03/2022 10:28 AM  Performed by: Lily Peer, Tinnie Kunin, CRNAPre-anesthesia Checklist: Patient identified, Emergency Drugs available, Suction available, Patient being monitored and Timeout performed Patient Re-evaluated:Patient Re-evaluated prior to induction Oxygen Delivery Method: Nasal cannula Induction Type: IV induction

## 2022-05-03 NOTE — Anesthesia Preprocedure Evaluation (Signed)
Anesthesia Evaluation  Patient identified by MRN, date of birth, ID band Patient awake    Reviewed: Allergy & Precautions, NPO status , Patient's Chart, lab work & pertinent test results  Airway Mallampati: II  TM Distance: >3 FB Neck ROM: Full    Dental  (+) Partial Upper   Pulmonary neg pulmonary ROS, sleep apnea and Continuous Positive Airway Pressure Ventilation , Current Smoker and Patient abstained from smoking.   Pulmonary exam normal  + decreased breath sounds      Cardiovascular Exercise Tolerance: Good hypertension, Pt. on medications + CAD and + DOE  negative cardio ROS Normal cardiovascular exam Rhythm:Regular     Neuro/Psych negative neurological ROS  negative psych ROS   GI/Hepatic negative GI ROS, Neg liver ROS, PUD,GERD  Medicated,,  Endo/Other  negative endocrine ROSdiabetes, Well Controlled, Type 2, Oral Hypoglycemic Agents  Morbid obesity  Renal/GU negative Renal ROS  negative genitourinary   Musculoskeletal   Abdominal  (+) + obese  Peds negative pediatric ROS (+)  Hematology negative hematology ROS (+)   Anesthesia Other Findings Past Medical History: 09/02/2014: Benign essential HTN No date: Colon polyps No date: Coronary artery calcification seen on CAT scan No date: Diabetes mellitus without complication (HCC) No date: Dyslipidemia 11/29/2017: Gastric ulcer No date: GERD (gastroesophageal reflux disease) No date: Hyperlipidemia No date: Hypertension 11/11/2021: Lactic acidosis No date: LBP (low back pain) 11/11/2021: NIDDM-2 with hyperglycemia 11/29/2017: Perforated bowel ([place]) 2014: Prostate cancer ([place])     Comment:  prostate  No date: RBBB 08/16/2012: Scrotal pain No date: Sleep apnea     Comment:  c-pap wears  Past Surgical History: No date: ABDOMINAL SURGERY     Comment:  gastric ulcer No date: back sugery     Comment:  1996, 2017, 2018 1996: BACK SURGERY     Comment:  2017,  2018, 2019 No date: gastric ulcers     Comment:  surgery 11-28-17 No date: INGUINAL HERNIA REPAIR     Comment:  right 11/28/2017: LAPAROSCOPY; N/A     Comment:  Procedure: LAPAROSCOPY DIAGNOSTIC converted to open;                Surgeon: Ralene Ok, MD;  Location: [place];                Service: General;  Laterality: N/A; 11/28/2017: LAPAROTOMY; N/A     Comment:  Procedure: EXPLORATORY LAPAROTOMY;  Surgeon: Ralene Ok, MD;  Location: [place];  Service: General;                Laterality: N/A; No date: MENISCUS REPAIR; Right 11/10/2017: POSTERIOR CERVICAL FUSION/FORAMINOTOMY; N/A     Comment:  Procedure: Posterior cervical fusion with lateral mass               fixation Cervical Three-Seven, cervical laminectomy               Cervical Three- Seven;  Surgeon: Eustace Moore, MD;                Location: [place];  Service: Neurosurgery;  Laterality:               N/A;  Posterior cervical fusion with lateral mass               fixation Cervical Three-Seven, cervical laminectomy               Cervical  Three- Seven 01/2012: PROSTATECTOMY No date: TONSILLECTOMY No date: TOOTH EXTRACTION  BMI    Body Mass Index: 35.00 kg/m      Reproductive/Obstetrics negative OB ROS                             Anesthesia Physical Anesthesia Plan  ASA: 3  Anesthesia Plan: General   Post-op Pain Management:    Induction: Intravenous  PONV Risk Score and Plan: Propofol infusion and TIVA  Airway Management Planned: Natural Airway  Additional Equipment:   Intra-op Plan:   Post-operative Plan:   Informed Consent: I have reviewed the patients History and Physical, chart, labs and discussed the procedure including the risks, benefits and alternatives for the proposed anesthesia with the patient or authorized representative who has indicated his/her understanding and acceptance.     Dental Advisory Given  Plan Discussed with: CRNA and  Surgeon  Anesthesia Plan Comments:        Anesthesia Quick Evaluation

## 2022-05-03 NOTE — H&P (Signed)
[name], MD [address], [place], [place], [place], [address] [address], [place], [place], [place], [address] Phone: [phone]  Fax: [phone]  Primary Care Physician:  Carollee Leitz, MD   Pre-Procedure History & Physical: HPI:  James Lee is a 71 y.o. male is here for an colonoscopy.   Past Medical History:  Diagnosis Date   Benign essential HTN 09/02/2014   Colon polyps    Coronary artery calcification seen on CAT scan    Diabetes mellitus without complication ([place])    Dyslipidemia    Gastric ulcer 11/29/2017   GERD (gastroesophageal reflux disease)    Hyperlipidemia    Hypertension    Lactic acidosis 11/11/2021   LBP (low back pain)    NIDDM-2 with hyperglycemia 11/11/2021   Perforated bowel ([place]) 11/29/2017   Prostate cancer ([place]) 2014   prostate    RBBB    Scrotal pain 08/16/2012   Sleep apnea    c-pap wears    Past Surgical History:  Procedure Laterality Date   ABDOMINAL SURGERY     gastric ulcer   back sugery     1996, 2017, 2018   [place]   2017, 2018, 2019   gastric ulcers     surgery 11-28-17   INGUINAL HERNIA REPAIR     right   LAPAROSCOPY N/A 11/28/2017   Procedure: LAPAROSCOPY DIAGNOSTIC converted to open;  Surgeon: Ralene Ok, MD;  Location: [place];  Service: General;  Laterality: N/A;   LAPAROTOMY N/A 11/28/2017   Procedure: EXPLORATORY LAPAROTOMY;  Surgeon: Ralene Ok, MD;  Location: [place];  Service: General;  Laterality: N/A;   MENISCUS REPAIR Right    POSTERIOR CERVICAL FUSION/FORAMINOTOMY N/A 11/10/2017   Procedure: Posterior cervical fusion with lateral mass fixation Cervical Three-Seven, cervical laminectomy Cervical Three- Seven;  Surgeon: Eustace Moore, MD;  Location: [place];  Service: Neurosurgery;  Laterality: N/A;  Posterior cervical fusion with lateral mass fixation Cervical Three-Seven, cervical laminectomy Cervical Three- Seven   PROSTATECTOMY  01/2012   TONSILLECTOMY     TOOTH EXTRACTION      Prior to  Admission medications   Medication Sig Start Date End Date Taking? Authorizing Provider  amLODipine (NORVASC) 5 MG tablet Take 5 mg by mouth daily.   Yes [provider]  aspirin 81 MG tablet Take 1 tablet (81 mg total) by mouth daily. 03/16/18  Yes Costella, Vista Mink, PA-C  chlorthalidone (HYGROTON) 25 MG tablet Take 25 mg by mouth daily. 12/31/21  Yes [provider]  Continuous Blood Gluc Sensor (FREESTYLE LIBRE 2 SENSOR) MISC by Does not apply route.   Yes [provider]  docusate sodium (COLACE) 100 MG capsule Take 100 mg by mouth 2 (two) times daily.   Yes [provider]  FARXIGA 10 MG TABS tablet Take 10 mg by mouth daily. 12/22/21  Yes [provider]  gabapentin (NEURONTIN) 300 MG capsule Take 600 mg by mouth 2 (two) times daily. 10/08/15  Yes [provider]  glipiZIDE (GLUCOTROL XL) 5 MG 24 hr tablet TAKE 1 TABLET(5 MG) BY MOUTH DAILY 04/05/22  Yes Carollee Leitz, MD  metFORMIN (GLUCOPHAGE-XR) 500 MG 24 hr tablet Take 1,000 mg by mouth 2 (two) times daily with a meal.   Yes [provider]  metoprolol succinate (TOPROL-XL) 50 MG 24 hr tablet Take 1 tablet (50 mg total) by mouth daily. Take with or immediately following a meal. 04/27/21  Yes Turner, Eber Hong, MD  Multiple Vitamins-Minerals (MULTIVITAMIN  WITH MINERALS) tablet Take 1 tablet by mouth daily.   Yes [provider]  olmesartan (BENICAR) 20 MG tablet Take 20 mg by mouth daily.   Yes [provider]  Semaglutide (RYBELSUS) 7 MG TABS Take 7 mg by mouth daily. 04/26/22  Yes Carollee Leitz, MD  rosuvastatin (CRESTOR) 10 MG tablet Take 1 tablet (10 mg total) by mouth daily. Patient not taking: Reported on 04/26/2022 02/28/22   Dutch Quint B, FNP    Allergies as of 04/12/2022   (No Known Allergies)    Family History  Problem Relation Age of Onset   Hypertension Mother    Hyperlipidemia Mother    Early death Mother    Drug abuse Mother    Alcohol  abuse Mother    Coronary artery disease Mother    Throat cancer Father    Cancer Father    Esophageal cancer Father        ?76 dx - died 28   Diabetes Sister    Depression Sister    Cancer Brother    Prostate cancer Brother    Colon cancer Neg Hx    Rectal cancer Neg Hx    Stomach cancer Neg Hx     Social History   Socioeconomic History   Marital status: Married    Spouse name: Not on file   Number of children: 2   Years of education: Not on file   Highest education level: Not on file  Occupational History   Occupation: Tel Visual merchandiser: [employer]  Tobacco Use   Smoking status: Every Day    Packs/day: 1.00    Years: 40.00    Total pack years: 40.00    Types: Cigarettes    Start date: 03/13/2012   Smokeless tobacco: Never  Vaping Use   Vaping Use: Never used  Substance and Sexual Activity   Alcohol use: Not Currently    Comment: rarely   Drug use: No   Sexual activity: Not on file  Other Topics Concern   Not on file  Social History Narrative   Not on file   Social Determinants of Health   Financial Resource Strain: Not on file  Food Insecurity: Not on file  Transportation Needs: Not on file  Physical Activity: Not on file  Stress: Not on file  Social Connections: Not on file  Intimate Partner Violence: Not on file    Review of Systems: See HPI, otherwise negative ROS  Physical Exam: BP 125/66   Pulse 72   Temp (!) 96.3 F (35.7 C) (Temporal)   Resp 18   Ht 5' 11.5" (1.816 m)   Wt 115.5 kg   SpO2 100%   BMI 35.00 kg/m  General:   Alert,  pleasant and cooperative in NAD Head:  Normocephalic and atraumatic. Neck:  Supple; no masses or thyromegaly. Lungs:  Clear throughout to auscultation, normal respiratory effort.    Heart:  +S1, +S2, Regular rate and rhythm, No edema. Abdomen:  Soft, nontender and nondistended. Normal bowel sounds, without guarding, and without rebound.   Neurologic:  Alert and  oriented x4;  grossly normal  neurologically.  Impression/Plan: James Lee is here for an colonoscopy to be performed for surveillance due to prior history of colon polyps   Risks, benefits, limitations, and alternatives regarding  colonoscopy have been reviewed with the patient.  Questions have been answered.  All parties agreeable.   [name], MD  05/03/2022, 10:22 AM

## 2022-05-03 NOTE — Transfer of Care (Signed)
Immediate Anesthesia Transfer of Care Note  Patient: James Lee  Procedure(s) Performed: COLONOSCOPY WITH PROPOFOL  Patient Location: Endoscopy Unit  Anesthesia Type:General  Level of Consciousness: drowsy  Airway & Oxygen Therapy: Patient Spontanous Breathing  Post-op Assessment: Report given to RN and Post -op Vital signs reviewed and stable  Post vital signs: Reviewed and stable  Last Vitals:  Vitals Value Taken Time  BP 103/53   Temp    Pulse 71   Resp 14   SpO2 95     Last Pain:  Vitals:   05/03/22 0957  TempSrc: Temporal  PainSc: 0-No pain         Complications: No notable events documented.

## 2022-05-03 NOTE — Anesthesia Postprocedure Evaluation (Signed)
Anesthesia Post Note  Patient: James Lee  Procedure(s) Performed: COLONOSCOPY WITH PROPOFOL  Patient location during evaluation: PACU Anesthesia Type: General Level of consciousness: awake and awake and alert Pain management: satisfactory to patient Vital Signs Assessment: post-procedure vital signs reviewed and stable Respiratory status: spontaneous breathing and nonlabored ventilation Cardiovascular status: stable Anesthetic complications: no  No notable events documented.   Last Vitals:  Vitals:   05/03/22 1051 05/03/22 1101  BP: 132/79 132/80  Pulse:  64  Resp: 20 15  Temp:    SpO2:  97%    Last Pain:  Vitals:   05/03/22 1101  TempSrc:   PainSc: 0-No pain                 VAN STAVEREN,Latitia Housewright

## 2022-05-04 ENCOUNTER — Other Ambulatory Visit: Payer: Self-pay

## 2022-05-04 ENCOUNTER — Ambulatory Visit: Payer: Medicare Other | Admitting: Anesthesiology

## 2022-05-04 ENCOUNTER — Ambulatory Visit
Admission: RE | Admit: 2022-05-04 | Discharge: 2022-05-04 | Disposition: A | Discharge status: Home / Self Care | Payer: Medicare Other | Attending: Gastroenterology | Admitting: Gastroenterology

## 2022-05-04 ENCOUNTER — Encounter
Admission: RE | Disposition: A | Discharge status: Home / Self Care | Payer: Self-pay | Source: Home / Self Care | Attending: Gastroenterology

## 2022-05-04 ENCOUNTER — Encounter: Payer: Self-pay | Admitting: Gastroenterology

## 2022-05-04 DIAGNOSIS — Z1211 Encounter for screening for malignant neoplasm of colon: Secondary | ICD-10-CM

## 2022-05-04 DIAGNOSIS — D122 Benign neoplasm of ascending colon: Secondary | ICD-10-CM | POA: Diagnosis not present

## 2022-05-04 DIAGNOSIS — D123 Benign neoplasm of transverse colon: Secondary | ICD-10-CM | POA: Insufficient documentation

## 2022-05-04 DIAGNOSIS — Z9079 Acquired absence of other genital organ(s): Secondary | ICD-10-CM | POA: Diagnosis not present

## 2022-05-04 DIAGNOSIS — D125 Benign neoplasm of sigmoid colon: Secondary | ICD-10-CM

## 2022-05-04 DIAGNOSIS — I10 Essential (primary) hypertension: Secondary | ICD-10-CM | POA: Diagnosis not present

## 2022-05-04 DIAGNOSIS — G4730 Sleep apnea, unspecified: Secondary | ICD-10-CM | POA: Diagnosis not present

## 2022-05-04 DIAGNOSIS — Z8711 Personal history of peptic ulcer disease: Secondary | ICD-10-CM | POA: Insufficient documentation

## 2022-05-04 DIAGNOSIS — Z6835 Body mass index (BMI) 35.0-35.9, adult: Secondary | ICD-10-CM | POA: Insufficient documentation

## 2022-05-04 DIAGNOSIS — E119 Type 2 diabetes mellitus without complications: Secondary | ICD-10-CM | POA: Diagnosis not present

## 2022-05-04 DIAGNOSIS — E669 Obesity, unspecified: Secondary | ICD-10-CM | POA: Diagnosis not present

## 2022-05-04 DIAGNOSIS — D120 Benign neoplasm of cecum: Secondary | ICD-10-CM | POA: Insufficient documentation

## 2022-05-04 DIAGNOSIS — Z7984 Long term (current) use of oral hypoglycemic drugs: Secondary | ICD-10-CM | POA: Insufficient documentation

## 2022-05-04 DIAGNOSIS — K219 Gastro-esophageal reflux disease without esophagitis: Secondary | ICD-10-CM | POA: Insufficient documentation

## 2022-05-04 DIAGNOSIS — Z8546 Personal history of malignant neoplasm of prostate: Secondary | ICD-10-CM | POA: Diagnosis not present

## 2022-05-04 DIAGNOSIS — K279 Peptic ulcer, site unspecified, unspecified as acute or chronic, without hemorrhage or perforation: Secondary | ICD-10-CM | POA: Diagnosis not present

## 2022-05-04 DIAGNOSIS — F17210 Nicotine dependence, cigarettes, uncomplicated: Secondary | ICD-10-CM | POA: Diagnosis not present

## 2022-05-04 DIAGNOSIS — I2510 Atherosclerotic heart disease of native coronary artery without angina pectoris: Secondary | ICD-10-CM | POA: Insufficient documentation

## 2022-05-04 DIAGNOSIS — E785 Hyperlipidemia, unspecified: Secondary | ICD-10-CM | POA: Diagnosis not present

## 2022-05-04 DIAGNOSIS — D124 Benign neoplasm of descending colon: Secondary | ICD-10-CM | POA: Diagnosis not present

## 2022-05-04 DIAGNOSIS — Z860100 Personal history of colon polyps, unspecified: Secondary | ICD-10-CM

## 2022-05-04 DIAGNOSIS — D126 Benign neoplasm of colon, unspecified: Secondary | ICD-10-CM

## 2022-05-04 DIAGNOSIS — K635 Polyp of colon: Secondary | ICD-10-CM | POA: Diagnosis not present

## 2022-05-04 HISTORY — PX: COLONOSCOPY WITH PROPOFOL: SHX5780

## 2022-05-04 HISTORY — DX: Encounter for screening for malignant neoplasm of colon: Z12.11

## 2022-05-04 LAB — GLUCOSE, CAPILLARY: Glucose-Capillary: 130 mg/dL — ABNORMAL HIGH (ref 70–99)

## 2022-05-04 SURGERY — COLONOSCOPY WITH PROPOFOL
Anesthesia: General

## 2022-05-04 MED ORDER — SODIUM CHLORIDE (PF) 0.9 % IJ SOLN
INTRAMUSCULAR | Status: DC | PRN
  Administered 2022-05-04: 6 mL via INTRAVENOUS

## 2022-05-04 MED ORDER — SODIUM CHLORIDE 0.9 % IV SOLN: INTRAVENOUS | Status: DC

## 2022-05-04 MED ORDER — PROPOFOL 10 MG/ML IV BOLUS
INTRAVENOUS | Status: DC | PRN
  Administered 2022-05-04: 100 mg via INTRAVENOUS

## 2022-05-04 MED ORDER — LIDOCAINE HCL (CARDIAC) PF 100 MG/5ML IV SOSY
PREFILLED_SYRINGE | INTRAVENOUS | Status: DC | PRN
  Administered 2022-05-04: 50 mg via INTRAVENOUS

## 2022-05-04 MED ORDER — PHENYLEPHRINE HCL (PRESSORS) 10 MG/ML IV SOLN
INTRAVENOUS | Status: DC | PRN
  Administered 2022-05-04: 160 ug via INTRAVENOUS

## 2022-05-04 MED ORDER — PROPOFOL 500 MG/50ML IV EMUL
INTRAVENOUS | Status: DC | PRN
  Administered 2022-05-04: 150 ug/kg/min via INTRAVENOUS

## 2022-05-04 NOTE — Anesthesia Procedure Notes (Signed)
Date/Time: 05/04/2022 10:48 AM  Performed by: Johnna Acosta, CRNAPre-anesthesia Checklist: Patient identified, Emergency Drugs available, Suction available, Patient being monitored and Timeout performed Patient Re-evaluated:Patient Re-evaluated prior to induction Oxygen Delivery Method: Nasal cannula Preoxygenation: Pre-oxygenation with 100% oxygen Induction Type: IV induction

## 2022-05-04 NOTE — Anesthesia Preprocedure Evaluation (Signed)
Anesthesia Evaluation  Patient identified by MRN, date of birth, ID band Patient awake    Reviewed: Allergy & Precautions, NPO status , Patient's Chart, lab work & pertinent test results  Airway Mallampati: II  TM Distance: >3 FB Neck ROM: Full    Dental  (+) Partial Upper   Pulmonary neg pulmonary ROS, sleep apnea and Continuous Positive Airway Pressure Ventilation , Current Smoker and Patient abstained from smoking.   Pulmonary exam normal  + decreased breath sounds      Cardiovascular Exercise Tolerance: Good hypertension, Pt. on medications + CAD and + DOE  negative cardio ROS Normal cardiovascular exam Rhythm:Regular     Neuro/Psych negative neurological ROS  negative psych ROS   GI/Hepatic negative GI ROS, Neg liver ROS, PUD,GERD  Medicated,,  Endo/Other  negative endocrine ROSdiabetes, Well Controlled, Type 2, Oral Hypoglycemic Agents  Morbid obesity  Renal/GU negative Renal ROS  negative genitourinary   Musculoskeletal   Abdominal  (+) + obese  Peds negative pediatric ROS (+)  Hematology negative hematology ROS (+)   Anesthesia Other Findings Past Medical History: 09/02/2014: Benign essential HTN No date: Colon polyps No date: Coronary artery calcification seen on CAT scan No date: Diabetes mellitus without complication (HCC) No date: Dyslipidemia 11/29/2017: Gastric ulcer No date: GERD (gastroesophageal reflux disease) No date: Hyperlipidemia No date: Hypertension 11/11/2021: Lactic acidosis No date: LBP (low back pain) 11/11/2021: NIDDM-2 with hyperglycemia 11/29/2017: Perforated bowel ([place]) 2014: Prostate cancer ([place])     Comment:  prostate  No date: RBBB 08/16/2012: Scrotal pain No date: Sleep apnea     Comment:  c-pap wears  Past Surgical History: No date: ABDOMINAL SURGERY     Comment:  gastric ulcer No date: back sugery     Comment:  1996, 2017, 2018 1996: BACK SURGERY     Comment:  2017,  2018, 2019 No date: gastric ulcers     Comment:  surgery 11-28-17 No date: INGUINAL HERNIA REPAIR     Comment:  right 11/28/2017: LAPAROSCOPY; N/A     Comment:  Procedure: LAPAROSCOPY DIAGNOSTIC converted to open;                Surgeon: Ralene Ok, MD;  Location: [facility];                Service: General;  Laterality: N/A; 11/28/2017: LAPAROTOMY; N/A     Comment:  Procedure: EXPLORATORY LAPAROTOMY;  Surgeon: Ralene Ok, MD;  Location: [facility];  Service: General;                Laterality: N/A; No date: MENISCUS REPAIR; Right 11/10/2017: POSTERIOR CERVICAL FUSION/FORAMINOTOMY; N/A     Comment:  Procedure: Posterior cervical fusion with lateral mass               fixation Cervical Three-Seven, cervical laminectomy               Cervical Three- Seven;  Surgeon: Eustace Moore, MD;                Location: [facility];  Service: Neurosurgery;  Laterality:               N/A;  Posterior cervical fusion with lateral mass               fixation Cervical Three-Seven, cervical laminectomy               Cervical  Three- Seven 01/2012: PROSTATECTOMY No date: TONSILLECTOMY No date: TOOTH EXTRACTION  BMI    Body Mass Index: 35.00 kg/m      Reproductive/Obstetrics negative OB ROS                             Anesthesia Physical Anesthesia Plan  ASA: 3  Anesthesia Plan: General   Post-op Pain Management:    Induction: Intravenous  PONV Risk Score and Plan: Propofol infusion and TIVA  Airway Management Planned: Natural Airway  Additional Equipment:   Intra-op Plan:   Post-operative Plan:   Informed Consent: I have reviewed the patients History and Physical, chart, labs and discussed the procedure including the risks, benefits and alternatives for the proposed anesthesia with the patient or authorized representative who has indicated his/her understanding and acceptance.     Dental Advisory Given  Plan Discussed with: CRNA and  Surgeon  Anesthesia Plan Comments: (IVGA, PIV x 1, ASA SM, RBO discussed, Consent signed.)       Anesthesia Quick Evaluation

## 2022-05-04 NOTE — Anesthesia Postprocedure Evaluation (Signed)
Anesthesia Post Note  Patient: Levi Klaiber  Procedure(s) Performed: COLONOSCOPY WITH PROPOFOL  Patient location during evaluation: PACU Anesthesia Type: General Level of consciousness: awake and alert Pain management: pain level controlled Vital Signs Assessment: post-procedure vital signs reviewed and stable Respiratory status: spontaneous breathing, nonlabored ventilation, respiratory function stable and patient connected to nasal cannula oxygen Cardiovascular status: stable and blood pressure returned to baseline Postop Assessment: no apparent nausea or vomiting Anesthetic complications: no   No notable events documented.   Last Vitals:  Vitals:   05/04/22 1139 05/04/22 1149  BP: 97/78 123/81  Pulse: 80 76  Resp: 20 16  Temp: 36.8 C   SpO2: 100% 98%    Last Pain:  Vitals:   05/04/22 1149  TempSrc:   PainSc: 0-No pain                 Milinda Pointer

## 2022-05-04 NOTE — Op Note (Signed)
[facility] Gastroenterology Patient Name: James Lee Procedure Date: 05/04/2022 10:26 AM MRN: [id number] Account #: [id number] Date of Birth: 05/03/1951 Admit Type: Outpatient Age: 71 Room: [facility] ENDO ROOM 2 Gender: Male Note Status: Finalized Instrument Name: Jasper Riling [id number] Procedure:             Colonoscopy Indications:           Screening for colorectal malignant neoplasm Providers:             [name] MD, MD Referring MD:          Carollee Leitz (Referring MD) Medicines:             Monitored Anesthesia Care Complications:         No immediate complications. Procedure:             Pre-Anesthesia Assessment:                        - Prior to the procedure, a History and Physical was                         performed, and patient medications, allergies and                         sensitivities were reviewed. The patient's tolerance                         of previous anesthesia was reviewed.                        - The risks and benefits of the procedure and the                         sedation options and risks were discussed with the                         patient. All questions were answered and informed                         consent was obtained.                        - ASA Grade Assessment: II - A patient with mild                         systemic disease.                        After obtaining informed consent, the colonoscope was                         passed under direct vision. Throughout the procedure,                         the patient's blood pressure, pulse, and oxygen                         saturations were monitored continuously. The                         Colonoscope was introduced through  the anus and                         advanced to the the cecum, identified by the                         appendiceal orifice. The colonoscopy was performed                         without difficulty. The patient tolerated the                          procedure well. The quality of the bowel preparation                         was fair. The appendiceal orifice was photographed. Findings:      The perianal and digital rectal examinations were normal.      Two sessile polyps were found in the cecum. The polyps were 5 to 8 mm in       size. These polyps were removed with a cold snare. Resection and       retrieval were complete. To prevent bleeding after the polypectomy, one       hemostatic clip was successfully placed. There was no bleeding at the       end of the procedure.      Two semi-sessile polyps were found in the ascending colon. The polyps       were 7 to 10 mm in size. These polyps were removed with a saline       injection-lift technique using a hot snare. Resection and retrieval were       complete.      A 5 mm polyp was found in the ascending colon. The polyp was sessile.       The polyp was removed with a cold snare. Resection and retrieval were       complete.      A 30 mm polyp was found in the ascending colon. The polyp was       semi-pedunculated. Preparations were made for mucosal resection.       Demarcation of the lesion was performed with narrow band imaging to       clearly identify the boundaries of the lesion. Eleview was injected to       raise the lesion. Piecemeal mucosal resection using a snare was       performed. Resection and retrieval were complete. Resected tissue       margins were examined and clear of polyp tissue. To prevent bleeding       after the polypectomy, three hemostatic clips were successfully placed.       There was no bleeding at the end of the procedure.      Two sessile polyps were found in the transverse colon. The polyps were 8       to 10 mm in size. These polyps were removed with a cold snare. Resection       and retrieval were complete. To prevent bleeding after the polypectomy,       one hemostatic clip was successfully placed. There was no bleeding at       the end of the  procedure.      Two sessile polyps were found in the sigmoid colon. The polyps were  6 to       8 mm in size. These polyps were removed with a cold snare. Resection and       retrieval were complete.      The exam was otherwise without abnormality on direct and retroflexion       views. Impression:            - Preparation of the colon was fair.                        - Two 5 to 8 mm polyps in the cecum, removed with a                         cold snare. Resected and retrieved. Clip was placed.                        - Two 7 to 10 mm polyps in the ascending colon,                         removed using injection-lift and a hot snare. Resected                         and retrieved.                        - One 5 mm polyp in the ascending colon, removed with                         a cold snare. Resected and retrieved.                        - One 30 mm polyp in the ascending colon, removed with                         mucosal resection. Resected and retrieved. Clips were                         placed.                        - Two 8 to 10 mm polyps in the transverse colon,                         removed with a cold snare. Resected and retrieved.                         Clip was placed.                        - Two 6 to 8 mm polyps in the sigmoid colon, removed                         with a cold snare. Resected and retrieved.                        - The examination was otherwise normal on direct and  retroflexion views.                        - Mucosal resection was performed. Resection and                         retrieval were complete. Recommendation:        - Discharge patient to home (with escort).                        - Resume previous diet.                        - No ibuprofen, naproxen, or other non-steroidal                         anti-inflammatory drugs for 12 weeks after polyp                         removal.                        - Repeat  colonoscopy in 6 months for surveillance                         after piecemeal polypectomy. Procedure Code(s):     --- Professional ---                        [id number], Colonoscopy, flexible; with endoscopic mucosal                         resection                        45385, 59, Colonoscopy, flexible; with removal of                         tumor(s), polyp(s), or other lesion(s) by snare                         technique                        45381, 62, Colonoscopy, flexible; with directed                         submucosal injection(s), any substance CPT copyright 2022 American Medical Association. All rights reserved. The codes documented in this report are preliminary and upon coder review may  be revised to meet current compliance requirements. [name], MD [name] MD, MD 05/04/2022 11:29:19 AM This report has been signed electronically. Number of Addenda: 0 Note Initiated On: 05/04/2022 10:26 AM Scope Withdrawal Time: 0 hours 31 minutes 51 seconds  Total Procedure Duration: 0 hours 33 minutes 31 seconds  Estimated Blood Loss:  Estimated blood loss: none.      [employer]

## 2022-05-04 NOTE — H&P (Signed)
James Bellows, MD [address], [place], [place], [place], [address] [address], [place], [place], [place], [address] Phone: [phone]  Fax: [phone]  Primary Care Physician:  Carollee Leitz, MD   Pre-Procedure History & Physical: HPI:  James Lee is a 71 y.o. male is here for an colonoscopy.   Past Medical History:  Diagnosis Date   Benign essential HTN 09/02/2014   Colon polyps    Coronary artery calcification seen on CAT scan    Diabetes mellitus without complication ([place])    Dyslipidemia    Gastric ulcer 11/29/2017   GERD (gastroesophageal reflux disease)    Hyperlipidemia    Hypertension    Lactic acidosis 11/11/2021   LBP (low back pain)    NIDDM-2 with hyperglycemia 11/11/2021   Perforated bowel ([place]) 11/29/2017   Prostate cancer ([place]) 2014   prostate    RBBB    Scrotal pain 08/16/2012   Sleep apnea    c-pap wears    Past Surgical History:  Procedure Laterality Date   ABDOMINAL SURGERY     gastric ulcer   back sugery     1996, 2017, 2018   [place]   2017, 2018, 2019   COLONOSCOPY WITH PROPOFOL N/A 05/03/2022   Procedure: COLONOSCOPY WITH PROPOFOL;  Surgeon: James Bellows, MD;  Location: [facility] ENDOSCOPY;  Service: Gastroenterology;  Laterality: N/A;   gastric ulcers     surgery 11-28-17   INGUINAL HERNIA REPAIR     right   LAPAROSCOPY N/A 11/28/2017   Procedure: LAPAROSCOPY DIAGNOSTIC converted to open;  Surgeon: Ralene Ok, MD;  Location: [place];  Service: General;  Laterality: N/A;   LAPAROTOMY N/A 11/28/2017   Procedure: EXPLORATORY LAPAROTOMY;  Surgeon: Ralene Ok, MD;  Location: [facility];  Service: General;  Laterality: N/A;   MENISCUS REPAIR Right    POSTERIOR CERVICAL FUSION/FORAMINOTOMY N/A 11/10/2017   Procedure: Posterior cervical fusion with lateral mass fixation Cervical Three-Seven, cervical laminectomy Cervical Three- Seven;  Surgeon: Eustace Moore, MD;  Location: [place];  Service: Neurosurgery;  Laterality: N/A;  Posterior  cervical fusion with lateral mass fixation Cervical Three-Seven, cervical laminectomy Cervical Three- Seven   PROSTATECTOMY  01/2012   TONSILLECTOMY     TOOTH EXTRACTION      Prior to Admission medications   Medication Sig Start Date End Date Taking? Authorizing Provider  amLODipine (NORVASC) 5 MG tablet Take 5 mg by mouth daily.   Yes [provider]  aspirin 81 MG tablet Take 1 tablet (81 mg total) by mouth daily. 03/16/18  Yes Costella, Vista Mink, PA-C  chlorthalidone (HYGROTON) 25 MG tablet Take 25 mg by mouth daily. 12/31/21  Yes [provider]  FARXIGA 10 MG TABS tablet Take 10 mg by mouth daily. 12/22/21  Yes [provider]  gabapentin (NEURONTIN) 300 MG capsule Take 600 mg by mouth 2 (two) times daily. 10/08/15  Yes [provider]  glipiZIDE (GLUCOTROL XL) 5 MG 24 hr tablet TAKE 1 TABLET(5 MG) BY MOUTH DAILY 04/05/22  Yes Carollee Leitz, MD  metFORMIN (GLUCOPHAGE-XR) 500 MG 24 hr tablet Take 1,000 mg by mouth 2 (two) times daily with a meal.   Yes [provider]  metoprolol succinate (TOPROL-XL) 50 MG 24 hr tablet Take 1 tablet (50 mg total) by mouth daily. Take with or immediately following a meal. 04/27/21  Yes Turner, Traci R, MD  olmesartan (BENICAR) 20 MG tablet Take 20 mg by mouth daily.   Yes [provider]  rosuvastatin (CRESTOR) 10 MG tablet Take 1 tablet (10 mg total) by mouth daily. 02/28/22  Yes Dutch Quint B, FNP  Continuous Blood Gluc Sensor (FREESTYLE LIBRE 2 SENSOR) MISC by Does not apply route.    [provider]  docusate sodium (COLACE) 100 MG capsule Take 100 mg by mouth 2 (two) times daily.    [provider]  Multiple Vitamins-Minerals (MULTIVITAMIN WITH MINERALS) tablet Take 1 tablet by mouth daily.    [provider]  Semaglutide (RYBELSUS) 7 MG TABS Take 7 mg by mouth daily. 04/26/22   Carollee Leitz, MD    Allergies as of 05/03/2022   (No Known Allergies)    Family History   Problem Relation Age of Onset   Hypertension Mother    Hyperlipidemia Mother    Early death Mother    Drug abuse Mother    Alcohol abuse Mother    Coronary artery disease Mother    Throat cancer Father    Cancer Father    Esophageal cancer Father        ?64 dx - died 41   Diabetes Sister    Depression Sister    Cancer Brother    Prostate cancer Brother    Colon cancer Neg Hx    Rectal cancer Neg Hx    Stomach cancer Neg Hx     Social History   Socioeconomic History   Marital status: Married    Spouse name: Not on file   Number of children: 2   Years of education: Not on file   Highest education level: Not on file  Occupational History   Occupation: Tel Visual merchandiser: [employer]  Tobacco Use   Smoking status: Every Day    Packs/day: 1.00    Years: 40.00    Total pack years: 40.00    Types: Cigarettes    Start date: 03/13/2012   Smokeless tobacco: Never  Vaping Use   Vaping Use: Never used  Substance and Sexual Activity   Alcohol use: Not Currently    Comment: rarely   Drug use: No   Sexual activity: Not on file  Other Topics Concern   Not on file  Social History Narrative   Not on file   Social Determinants of Health   Financial Resource Strain: Not on file  Food Insecurity: Not on file  Transportation Needs: Not on file  Physical Activity: Not on file  Stress: Not on file  Social Connections: Not on file  Intimate Partner Violence: Not on file    Review of Systems: See HPI, otherwise negative ROS  Physical Exam: BP 135/74   Pulse 84   Temp (!) 96.4 F (35.8 C) (Temporal)   Resp 20   Ht '5\' 11"'$  (1.803 m)   Wt 115.2 kg   SpO2 99%   BMI 35.43 kg/m  General:   Alert,  pleasant and cooperative in NAD Head:  Normocephalic and atraumatic. Neck:  Supple; no masses or thyromegaly. Lungs:  Clear throughout to auscultation, normal respiratory effort.    Heart:  +S1, +S2, Regular rate and rhythm, No edema. Abdomen:  Soft, nontender and  nondistended. Normal bowel sounds, without guarding, and without rebound.   Neurologic:  Alert and  oriented x4;  grossly normal neurologically.  Impression/Plan: [name] is here for an colonoscopy to be performed for Screening colonoscopy average risk   Risks, benefits, limitations, and alternatives regarding  colonoscopy have been reviewed with the patient.  Questions  have been answered.  All parties agreeable.   James Bellows, MD  05/04/2022, 10:35 AM

## 2022-05-04 NOTE — Transfer of Care (Signed)
Immediate Anesthesia Transfer of Care Note  Patient: James Lee  Procedure(s) Performed: COLONOSCOPY WITH PROPOFOL  Patient Location: PACU  Anesthesia Type:General  Level of Consciousness: sedated  Airway & Oxygen Therapy: Patient Spontanous Breathing  Post-op Assessment: Report given to RN and Post -op Vital signs reviewed and stable  Post vital signs: Reviewed and stable  Last Vitals:  Vitals Value Taken Time  BP    Temp    Pulse    Resp    SpO2      Last Pain:  Vitals:   05/04/22 0946  TempSrc: Temporal  PainSc: 0-No pain         Complications: No notable events documented.

## 2022-05-06 LAB — SURGICAL PATHOLOGY

## 2022-05-11 ENCOUNTER — Encounter: Payer: Self-pay | Admitting: Family Medicine

## 2022-05-16 ENCOUNTER — Telehealth: Payer: Self-pay | Admitting: Family Medicine

## 2022-05-16 NOTE — Telephone Encounter (Signed)
Copied from [place] [id number]. Topic: Medicare AWV > May 16, 2022  1:37 PM Devoria Glassing wrote: Reason for CRM: LVM 05/16/22 to r/s AWV 05/23/22 to 05/27/22 at 9:45 khc Please confirm appt change.  If not we can r/s the week of 12/18 - 12/22.

## 2022-05-23 ENCOUNTER — Ambulatory Visit: Payer: Medicare Other

## 2022-05-23 NOTE — Progress Notes (Signed)
Non urgent video or office follow up over next few weeks to discuss the results of the colonoscopy

## 2022-05-25 ENCOUNTER — Telehealth: Payer: Self-pay

## 2022-05-25 NOTE — Telephone Encounter (Signed)
-----   Message from [name], MD sent at 05/23/2022 10:45 AM EST ----- Non urgent video or office follow up over next few weeks to discuss the results of the colonoscopy

## 2022-05-25 NOTE — Telephone Encounter (Signed)
Called patient to let him know that Dr. Vicente Males wanted him to have a video visit with him so he could go over his colonoscopy results. Patient agreed and he his video visit appointment will be on 06/01/2022 at 1:15 PM.

## 2022-05-27 ENCOUNTER — Ambulatory Visit: Payer: Medicare Other

## 2022-05-27 DIAGNOSIS — E1122 Type 2 diabetes mellitus with diabetic chronic kidney disease: Secondary | ICD-10-CM | POA: Diagnosis not present

## 2022-05-29 ENCOUNTER — Other Ambulatory Visit: Payer: Self-pay | Admitting: Family

## 2022-05-30 ENCOUNTER — Other Ambulatory Visit: Payer: Self-pay | Admitting: Cardiology

## 2022-05-31 ENCOUNTER — Other Ambulatory Visit: Payer: Medicare Other | Admitting: Pharmacist

## 2022-05-31 ENCOUNTER — Telehealth: Payer: Self-pay | Admitting: Pharmacist

## 2022-05-31 NOTE — Patient Instructions (Signed)
James Lee,   We will submit your assistance application for Rybelsus through [employer].   Check your blood pressure twice weekly, and any time you have concerning symptoms like headache, chest pain, dizziness, shortness of breath, or vision changes.   Our goal is less than 130/80.  To appropriately check your blood pressure, make sure you do the following:  1) Avoid caffeine, exercise, or tobacco products for 30 minutes before checking. Empty your bladder. 2) Sit with your back supported in a flat-backed chair. Rest your arm on something flat (arm of the chair, table, etc). 3) Sit still with your feet flat on the floor, resting, for at least 5 minutes.  4) Check your blood pressure. Take 1-2 readings.  5) Write down these readings and bring with you to any provider appointments.  Bring your home blood pressure machine with you to a provider's office for accuracy comparison at least once a year.   Make sure you take your blood pressure medications before you come to any office visit, even if you were asked to fast for labs.  Take care!  Catie Hedwig Morton, PharmD, [facility] [phone]

## 2022-05-31 NOTE — Progress Notes (Unsigned)
Patient's wife notes they dropped off patient portion of [employer] patient assistance application at the front desk on Friday and it was placed in the stack for Dr. Loistine Chance CMA.   Can these be faxed to Anette Riedel, CPhT at [phone]?  Additionally, do we have any Rybelsus 3 mg samples in stock?  Thanks!

## 2022-05-31 NOTE — Progress Notes (Signed)
05/31/2022 Name: James Lee MRN: [id number] DOB: 08-13-1950  Chief Complaint  Patient presents with   Medication Management   Diabetes    James Lee is a 71 y.o. year old male who presented for a telephone visit.   They were referred to the pharmacist by their PCP for assistance in managing diabetes.    Subjective:  Care Team: Primary Care Provider: Carollee Leitz, MD ; Next Scheduled Visit: 07/13/21  Medication Access/Adherence  Current Pharmacy:  [facility] [place], [address] [place] [address] Phone: [phone] Fax: [phone]   Patient reports affordability concerns with their medications: Yes  Patient reports access/transportation concerns to their pharmacy: No  Patient reports adherence concerns with their medications:  Yes     Diabetes:  Current medications: metformin XR 1000 mg twice daily, Farxiga 10 mg daily, had been taking Rybelsus 3 mg daily but ran out.   Current glucose readings: 160-180s   Current medication access support: working on patient assistance for Rybelsus 7 mg daily. Reports he tolerated Rybelsus 3 mg well, sugars were much improved. This is also reflected on [name].   Patient reports   Hypertension:  Current medications: amlodipine 5 mg daily, metoprolol succinate 50 mg daily, chlorthalidone 50 mg daily (per nephrology), olmesartan 20 mg daily  Patient has a validated, automated, upper arm home BP cuff, has not been checking lately  Hyperlipidemia/ASCVD Risk Reduction  Current lipid lowering medications: rosuvastatin 10 mg daily  Antiplatelet regimen: aspirin 81 mg daily   Health Maintenance  Health Maintenance Due  Topic Date Due   Medicare Annual Wellness (AWV)  Never done   OPHTHALMOLOGY EXAM  Never done   Diabetic kidney evaluation - Urine ACR  Never done   Lung Cancer Screening  01/28/2016   Zoster Vaccines- Shingrix (2 of 2)  12/23/2016   FOOT EXAM  11/30/2021   COVID-19 Vaccine (7 - 2023-24 season) 02/11/2022     Objective: Lab Results  Component Value Date   HGBA1C 8.2 (H) 02/24/2022    Lab Results  Component Value Date   CREATININE 1.80 (H) 02/24/2022   BUN 27 (H) 02/24/2022   NA 143 02/24/2022   K 5.1 02/24/2022   CL 105 02/24/2022   CO2 29 02/24/2022    Lab Results  Component Value Date   CHOL 141 02/24/2022   HDL 28.90 (L) 02/24/2022   LDLCALC 75 04/27/2021   LDLDIRECT 80.0 02/24/2022   TRIG 342.0 (H) 02/24/2022   CHOLHDL 5 02/24/2022    Medications Reviewed Today     Reviewed by Osker Mason, RPH-CPP (Pharmacist) on 05/31/22 at [place] List Status: <None>   Medication Order Taking? Sig Documenting Provider Last Dose Status Informant  amLODipine (NORVASC) 5 MG tablet [id number] Yes Take 5 mg by mouth daily. [provider] Taking Active Self  aspirin 81 MG tablet [id number] Yes Take 1 tablet (81 mg total) by mouth daily. Costella, Vista Mink, PA-C Taking Active Self  chlorthalidone (HYGROTON) 50 MG tablet [id number] Yes Take 50 mg by mouth daily. [provider] Taking Active   Continuous Blood Gluc Sensor (FREESTYLE LIBRE 2 SENSOR) [place] [id number] Yes by Does not apply route. [provider] Taking Active   docusate sodium (COLACE) 100 MG capsule [id number] Yes Take 100 mg by mouth 2 (two) times daily. [provider] Taking Active   FARXIGA 10 MG TABS tablet [id number] Yes Take  10 mg by mouth daily. [provider] Taking Active   gabapentin (NEURONTIN) 300 MG capsule [id number] Yes Take 600 mg by mouth 2 (two) times daily. [provider] Taking Active Self           Med Note Caryn Section, Marylyn Ishihara A   Thu Nov 12, 2015  9:17 AM)    glipiZIDE (GLUCOTROL XL) 5 MG 24 hr tablet [id number] Yes TAKE 1 TABLET(5 MG) BY MOUTH DAILY James Leitz, MD Taking Active   metFORMIN (GLUCOPHAGE-XR) 500 MG 24 hr tablet [id number] Yes Take 1,000 mg by mouth 2  (two) times daily with a meal. [provider] Taking Active   metoprolol succinate (TOPROL-XL) 50 MG 24 hr tablet [id number] Yes Take 1 tablet (50 mg total) by mouth daily. Sueanne Margarita, MD Taking Active   Multiple Vitamins-Minerals (MULTIVITAMIN WITH MINERALS) tablet [id number] Yes Take 1 tablet by mouth daily. [provider] Taking Active Self  olmesartan (BENICAR) 20 MG tablet [id number] Yes Take 20 mg by mouth daily. [provider] Taking Active   rosuvastatin (CRESTOR) 10 MG tablet [id number] Yes Take 1 tablet (10 mg total) by mouth daily. Dutch Quint B, FNP Taking Active   Semaglutide (RYBELSUS) 7 MG TABS [id number] No Take 7 mg by mouth daily.  Patient not taking: Reported on 05/31/2022   James Leitz, MD Not Taking Active               Assessment/Plan:   Diabetes: - Currently uncontrolled - Recommend to continue to collaborate with [facility] regarding medication assistance. Will collaborate with office to see if there are any Rybelsus samples. In the meantime, continue current regimen.   - Recommend to check glucose continuously using CGM.   Hypertension: - Currently controlled per last documented office reading - Reviewed long term cardiovascular and renal outcomes of uncontrolled blood pressure - Reviewed appropriate blood pressure monitoring technique and reviewed goal blood pressure. Recommended to check home blood pressure and heart rate periodically - Recommend to continue current regimen  Hyperlipidemia/ASCVD Risk Reduction: - Currently uncontrolled.  - Recommend to continue current regimen at this time  Follow Up Plan: phone call in ~ 4 weeks  Catie Hedwig Morton, PharmD, [facility] [phone]

## 2022-06-01 ENCOUNTER — Telehealth (INDEPENDENT_AMBULATORY_CARE_PROVIDER_SITE_OTHER): Payer: Medicare Other | Admitting: Gastroenterology

## 2022-06-01 DIAGNOSIS — Z86010 Personal history of colonic polyps: Secondary | ICD-10-CM | POA: Diagnosis not present

## 2022-06-01 DIAGNOSIS — D120 Benign neoplasm of cecum: Secondary | ICD-10-CM | POA: Diagnosis not present

## 2022-06-01 NOTE — Telephone Encounter (Signed)
Faxed paperwork on 05/31/22

## 2022-06-01 NOTE — Telephone Encounter (Signed)
Has this paperwork been faxed to me? I need to send you all the pcp pages to complete the app before sending to the pap company.  Fax [phone]

## 2022-06-01 NOTE — Addendum Note (Signed)
Addended by: Wayna Chalet on: 06/01/2022 01:41 PM   Modules accepted: Orders

## 2022-06-01 NOTE — Telephone Encounter (Signed)
It was faxed on 05/31/22

## 2022-06-01 NOTE — Progress Notes (Signed)
James Lee , MD [address]  [place]  [place], [place] [address]  Main: [phone]  Fax: [phone]   Primary Care Physician: Carollee Leitz, MD  Virtual Visit via Video Note  I connected with patient on 06/01/22 at  1:15 PM EST by video and verified that I am speaking with the correct person using two identifiers.   I discussed the limitations, risks, security and privacy concerns of performing an evaluation and management service by video  and the availability of in person appointments. I also discussed with the patient that there may be a patient responsible charge related to this service. The patient expressed understanding and agreed to proceed.  Location of Patient: Home Location of Provider: Home Persons involved: Patient and provider only   History of Present Illness: Chief Complaint  Patient presents with   history of colon polyps    HPI: James Lee is a 71 y.o. male    He is here today to discuss the results of his colonoscopy.  He has a history of prior colon polyps and recollects that the last colonoscopy was over 6 years back at [facility] gastroenterology where he had a few polyps taken out.  He has a 30 year old son who also had a colonoscopy at the age of 99 and was found to have multiple polyps in his colon.  I performed his colonoscopy on 05/04/2022 and resected numerous polyps 2 subcentimeter polyps in the cecum resected, 2 semisessile polyps 7 to 10 mm in size resected in the ascending colon using EMR, 5 mm polyp in the ascending colon resected with a cold snare, 30 mm polyp in the ascending colon semipedunculated resected via EMR piecemeal following which 3 clips were placed.  2 sessile polyps in the transverse colon 8 to 10 mm in size resected with a cold snare and 2 sessile polyps in the sigmoid colon 6 to 8 mm in size resected as well.  Pathology revealed that the cecal polyps were a combination of tubular adenoma and sessile serrated polyps.  The  large polyp in the ascending colon which was resected via hot snare showed focal high-grade dysplasia and the cauterized polyp base appears narrowly free of dysplasia.  Transverse colon polyps were tubular adenomas descending colon polyps were also sessile serrated polyps and tubular adenomas negative for high-grade dysplasia and malignancy.  There were 4 polyps in the ascending colon resected hot snare and 1 with a cold snare, multiple fragments were noted low-grade dysplasia present at cautery.     Current Outpatient Medications  Medication Sig Dispense Refill   amLODipine (NORVASC) 5 MG tablet Take 5 mg by mouth daily.     aspirin 81 MG tablet Take 1 tablet (81 mg total) by mouth daily. 30 tablet 0   chlorthalidone (HYGROTON) 50 MG tablet Take 50 mg by mouth daily.     Continuous Blood Gluc Sensor (FREESTYLE LIBRE 2 SENSOR) MISC by Does not apply route.     docusate sodium (COLACE) 100 MG capsule Take 100 mg by mouth 2 (two) times daily.     FARXIGA 10 MG TABS tablet Take 10 mg by mouth daily.     gabapentin (NEURONTIN) 300 MG capsule Take 600 mg by mouth 2 (two) times daily.  1   glipiZIDE (GLUCOTROL XL) 5 MG 24 hr tablet TAKE 1 TABLET(5 MG) BY MOUTH DAILY 90 tablet 0   metFORMIN (GLUCOPHAGE-XR) 500 MG 24 hr tablet Take 1,000 mg by mouth 2 (two) times daily with a  meal.     metoprolol succinate (TOPROL-XL) 50 MG 24 hr tablet Take 1 tablet (50 mg total) by mouth daily. 90 tablet 0   Multiple Vitamins-Minerals (MULTIVITAMIN WITH MINERALS) tablet Take 1 tablet by mouth daily.     olmesartan (BENICAR) 20 MG tablet Take 20 mg by mouth daily.     rosuvastatin (CRESTOR) 10 MG tablet Take 1 tablet (10 mg total) by mouth daily. 30 tablet 2   Semaglutide (RYBELSUS) 7 MG TABS Take 7 mg by mouth daily. (Patient not taking: Reported on 05/31/2022)     No current facility-administered medications for this visit.    Allergies as of 06/01/2022   (No Known Allergies)    Review of Systems:    All  systems reviewed and negative except where noted in HPI.  General Appearance:    Alert, cooperative, no distress, appears stated age  Head:    Normocephalic, without obvious abnormality, atraumatic  Eyes:    PERRL, conjunctiva/corneas clear,  Ears:    Grossly normal hearing    Neurologic:  Grossly normal    Observations/Objective:  Labs: CMP     Component Value Date/Time   NA 143 02/24/2022 1048   K 5.1 02/24/2022 1048   CL 105 02/24/2022 1048   CO2 29 02/24/2022 1048   GLUCOSE 123 (H) 02/24/2022 1048   BUN 27 (H) 02/24/2022 1048   CREATININE 1.80 (H) 02/24/2022 1048   CALCIUM 10.2 02/24/2022 1048   PROT 6.7 02/24/2022 1048   ALBUMIN 4.2 02/24/2022 1048   AST 16 02/24/2022 1048   ALT 18 02/24/2022 1048   ALKPHOS 63 02/24/2022 1048   BILITOT 0.5 02/24/2022 1048   GFRNONAA 36 (L) 11/11/2021 0621   GFRAA >60 03/06/2018 0946   Lab Results  Component Value Date   WBC 10.1 02/24/2022   HGB 15.1 02/24/2022   HCT 45.3 02/24/2022   MCV 94.3 02/24/2022   PLT 228.0 02/24/2022    Imaging Studies: No results found.  Assessment and Plan:   James Lee is a 71 y.o. y/o male with a history of over 10 tubular adenomas resected via colonoscopy, son had colon polyps in his 63s.  The polyps were a combination of serrated adenomas, tubular adenoma without dysplasia and tubular adenoma with high-grade dysplasia.  The margins appeared to be free of dysplasia and the polyp with high-grade dysplasia but the polyp which was taken out in pieces showed low-grade dysplasia at the cautery site.  Endoscopically I am reasonably confident that the polyps had been resected.  Explained to him that we need to repeat colonoscopy in 6 months due to piecemeal polypectomy and review of his family history we will need to screen him for genetic disorders which can be passed on from 1 generation to another which would put his children at high risk for colon polyps and may need more intensive screening.  He agreed  to proceed with both recommendations    I have discussed alternative options, risks & benefits,  which include, but are not limited to, bleeding, infection, perforation,respiratory complication & drug reaction.  The patient agrees with this plan & written consent will be obtained.       I discussed the assessment and treatment plan with the patient. The patient was provided an opportunity to ask questions and all were answered. The patient agreed with the plan and demonstrated an understanding of the instructions.   The patient was advised to call back or seek an in-person evaluation if the symptoms worsen or  if the condition fails to improve as anticipated.  I provided 14 minutes of face-to-face time during this encounter.  Dr James Bellows MD,MRCP [facility]) Gastroenterology/Hepatology Pager: [phone]   Speech recognition software was used to dictate this note.

## 2022-06-02 NOTE — Telephone Encounter (Signed)
I faxed it to the number on the form.

## 2022-06-03 ENCOUNTER — Telehealth: Payer: Self-pay

## 2022-06-07 NOTE — Telephone Encounter (Signed)
error 

## 2022-06-08 ENCOUNTER — Encounter: Payer: Self-pay | Admitting: Nurse Practitioner

## 2022-06-08 ENCOUNTER — Ambulatory Visit (INDEPENDENT_AMBULATORY_CARE_PROVIDER_SITE_OTHER): Payer: Medicare Other | Admitting: Nurse Practitioner

## 2022-06-08 ENCOUNTER — Telehealth: Payer: Self-pay

## 2022-06-08 VITALS — BP 138/78 | HR 83 | Temp 99.0°F | Ht 71.0 in | Wt 268.8 lb

## 2022-06-08 DIAGNOSIS — E119 Type 2 diabetes mellitus without complications: Secondary | ICD-10-CM

## 2022-06-08 DIAGNOSIS — B9689 Other specified bacterial agents as the cause of diseases classified elsewhere: Secondary | ICD-10-CM

## 2022-06-08 DIAGNOSIS — H109 Unspecified conjunctivitis: Secondary | ICD-10-CM | POA: Diagnosis not present

## 2022-06-08 DIAGNOSIS — Z0100 Encounter for examination of eyes and vision without abnormal findings: Secondary | ICD-10-CM | POA: Diagnosis not present

## 2022-06-08 HISTORY — DX: Type 2 diabetes mellitus without complications: E11.9

## 2022-06-08 MED ORDER — ERYTHROMYCIN 5 MG/GM OP OINT: 1.0000 | TOPICAL_OINTMENT | Freq: Two times a day (BID) | OPHTHALMIC | 0 refills | Status: AC

## 2022-06-08 NOTE — Assessment & Plan Note (Signed)
Erythromycin ointment BID. Will place referral to [facility] for eye exam.

## 2022-06-08 NOTE — Assessment & Plan Note (Signed)
Refer to [facility]. Patient has been seeing a pediatric ophthalmologist and is requesting a new provider. Discussed importance of completing yearly eye exam.

## 2022-06-08 NOTE — Telephone Encounter (Signed)
Medication Samples have been provided to the patient.  Drug name: Rybelsus       Strength: 3 mg        Qty: 1  LOT: [id number]  Exp.Date: 07/2023  Dosing instructions: Take 7 mg by mouth daily   The patient has been instructed regarding the correct time, dose, and frequency of taking this medication, including desired effects and most common side effects.   Gracy Racer 8:24 AM 06/08/2022

## 2022-06-08 NOTE — Progress Notes (Signed)
Tomasita Morrow, NP-C Phone: [phone]  James Lee is a 71 y.o. male who presents today for conjunctivitis.  Patient reports recurring conjunctivitis. The most recent occurring 3-4 days ago. He was seen in September for the same thing, treated with Erythromycin ointment and symptoms resolved. He is requesting more ointment.  Reports redness and discharge from bilateral eyes. Mild burning and itching, eyes feel very irritated. Occasional blurry vision. Eyes matted shut in the mornings.   Patient reports he has not had an eye exam this year and would like a referral to a new Ophthalmologist.   Social History   Tobacco Use  Smoking Status Every Day   Packs/day: 1.00   Years: 40.00   Total pack years: 40.00   Types: Cigarettes   Start date: 03/13/2012  Smokeless Tobacco Never    Current Outpatient Medications on File Prior to Visit  Medication Sig Dispense Refill   amLODipine (NORVASC) 5 MG tablet Take 5 mg by mouth daily.     aspirin 81 MG tablet Take 1 tablet (81 mg total) by mouth daily. 30 tablet 0   chlorthalidone (HYGROTON) 50 MG tablet Take 50 mg by mouth daily.     Continuous Blood Gluc Sensor (FREESTYLE LIBRE 2 SENSOR) MISC by Does not apply route.     docusate sodium (COLACE) 100 MG capsule Take 100 mg by mouth 2 (two) times daily.     FARXIGA 10 MG TABS tablet Take 10 mg by mouth daily.     gabapentin (NEURONTIN) 300 MG capsule Take 600 mg by mouth 2 (two) times daily.  1   glipiZIDE (GLUCOTROL XL) 5 MG 24 hr tablet TAKE 1 TABLET(5 MG) BY MOUTH DAILY 90 tablet 0   metFORMIN (GLUCOPHAGE-XR) 500 MG 24 hr tablet Take 1,000 mg by mouth 2 (two) times daily with a meal.     metoprolol succinate (TOPROL-XL) 50 MG 24 hr tablet Take 1 tablet (50 mg total) by mouth daily. 90 tablet 0   Multiple Vitamins-Minerals (MULTIVITAMIN WITH MINERALS) tablet Take 1 tablet by mouth daily.     olmesartan (BENICAR) 20 MG tablet Take 20 mg by mouth daily.     rosuvastatin (CRESTOR) 10 MG tablet  Take 1 tablet (10 mg total) by mouth daily. 30 tablet 2   Semaglutide (RYBELSUS) 7 MG TABS Take 7 mg by mouth daily. (Patient not taking: Reported on 05/31/2022)     No current facility-administered medications on file prior to visit.     ROS see history of present illness  Objective  Physical Exam Vitals:   06/08/22 0807  BP: 138/78  Pulse: 83  Temp: 99 F (37.2 C)  SpO2: 96%    BP Readings from Last 3 Encounters:  06/08/22 138/78  05/04/22 123/81  05/03/22 132/80   Wt Readings from Last 3 Encounters:  06/08/22 268 lb 12.8 oz (121.9 kg)  05/04/22 254 lb (115.2 kg)  05/03/22 254 lb 8.3 oz (115.5 kg)    Physical Exam Constitutional:      General: He is not in acute distress.    Appearance: Normal appearance.  HENT:     Head: Normocephalic.  Eyes:     General:        Right eye: Discharge present.        Left eye: Discharge present.    Pupils: Pupils are equal, round, and reactive to light.     Comments: Erythema present bilaterally  Cardiovascular:     Rate and Rhythm: Normal rate and regular rhythm.  Pulmonary:  Effort: Pulmonary effort is normal. No respiratory distress.     Breath sounds: Normal breath sounds.  Neurological:     Mental Status: He is alert.    Assessment/Plan: Please see individual problem list.  Bacterial conjunctivitis of both eyes  Erythromycin ointment BID. Will place referral to [facility] for eye exam.   Diabetic eye exam [facility])  Refer to [facility]. Patient has been seeing a pediatric ophthalmologist and is requesting a new provider. Discussed importance of completing yearly eye exam.     Return if symptoms worsen or fail to improve.   Tomasita Morrow, NP-C [place]

## 2022-06-09 ENCOUNTER — Ambulatory Visit (INDEPENDENT_AMBULATORY_CARE_PROVIDER_SITE_OTHER): Payer: Medicare Other

## 2022-06-09 VITALS — Ht 71.0 in | Wt 268.0 lb

## 2022-06-09 DIAGNOSIS — Z0000 Encounter for general adult medical examination without abnormal findings: Secondary | ICD-10-CM | POA: Diagnosis not present

## 2022-06-09 NOTE — Patient Instructions (Addendum)
James Lee , Thank you for taking time to come for your Medicare Wellness Visit. I appreciate your ongoing commitment to your health goals. Please review the following plan we discussed and let me know if I can assist you in the future.   These are the goals we discussed:  Goals       Patient Stated     Lose weight (pt-stated)      Portion control meals Chair exercises        This is a list of the screening recommended for you and due dates:  Health Maintenance  Topic Date Due   Eye exam for diabetics  Never done   Yearly kidney health urinalysis for diabetes  Never done   Screening for Lung Cancer  01/28/2016   Complete foot exam   11/30/2021   COVID-19 Vaccine (5 - 2023-24 season) 06/25/2022*   Zoster (Shingles) Vaccine (2 of 2) 09/08/2022*   Hemoglobin A1C  08/25/2022   Yearly kidney function blood test for diabetes  02/25/2023   Medicare Annual Wellness Visit  06/10/2023   DTaP/Tdap/Td vaccine (4 - Td or Tdap) 10/29/2026   Colon Cancer Screening  05/04/2032   Pneumonia Vaccine  Completed   Flu Shot  Completed   Hepatitis C Screening: USPSTF Recommendation to screen - Ages 18-79 yo.  Completed   HPV Vaccine  Aged Out  *Topic was postponed. The date shown is not the original due date.    Advanced directives: End of life planning; Advance aging; Advanced directives discussed.  Copy of current HCPOA requested.  Additional information available in office as needed.   Conditions/risks identified: none new.   Next appointment: Follow up in one year for your annual wellness visit.   Preventive Care 36 Years and Older, Male  Preventive care refers to lifestyle choices and visits with your health care provider that can promote health and wellness. What does preventive care include? A yearly physical exam. This is also called an annual well check. Dental exams once or twice a year. Routine eye exams. Ask your health care provider how often you should have your eyes  checked. Personal lifestyle choices, including: Daily care of your teeth and gums. Regular physical activity. Eating a healthy diet. Avoiding tobacco and drug use. Limiting alcohol use. Practicing safe sex. Taking low doses of aspirin every day. Taking vitamin and mineral supplements as recommended by your health care provider. What happens during an annual well check? The services and screenings done by your health care provider during your annual well check will depend on your age, overall health, lifestyle risk factors, and family history of disease. Counseling  Your health care provider may ask you questions about your: Alcohol use. Tobacco use. Drug use. Emotional well-being. Home and relationship well-being. Sexual activity. Eating habits. History of falls. Memory and ability to understand (cognition). Work and work Statistician. Screening  You may have the following tests or measurements: Height, weight, and BMI. Blood pressure. Lipid and cholesterol levels. These may be checked every 5 years, or more frequently if you are over 90 years old. Skin check. Lung cancer screening. You may have this screening every year starting at age 65 if you have a 30-pack-year history of smoking and currently smoke or have quit within the past 15 years. Fecal occult blood test (FOBT) of the stool. You may have this test every year starting at age 76. Flexible sigmoidoscopy or colonoscopy. You may have a sigmoidoscopy every 5 years or a colonoscopy every 10  years starting at age 71. Prostate cancer screening. Recommendations will vary depending on your family history and other risks. Hepatitis C blood test. Hepatitis B blood test. Sexually transmitted disease (STD) testing. Diabetes screening. This is done by checking your blood sugar (glucose) after you have not eaten for a while (fasting). You may have this done every 1-3 years. Abdominal aortic aneurysm (AAA) screening. You may need this  if you are a current or former smoker. Osteoporosis. You may be screened starting at age 37 if you are at high risk. Talk with your health care provider about your test results, treatment options, and if necessary, the need for more tests. Vaccines  Your health care provider may recommend certain vaccines, such as: Influenza vaccine. This is recommended every year. Tetanus, diphtheria, and acellular pertussis (Tdap, Td) vaccine. You may need a Td booster every 10 years. Zoster vaccine. You may need this after age 35. Pneumococcal 13-valent conjugate (PCV13) vaccine. One dose is recommended after age 59. Pneumococcal polysaccharide (PPSV23) vaccine. One dose is recommended after age 47. Talk to your health care provider about which screenings and vaccines you need and how often you need them. This information is not intended to replace advice given to you by your health care provider. Make sure you discuss any questions you have with your health care provider. Document Released: 06/26/2015 Document Revised: 02/17/2016 Document Reviewed: 03/31/2015 Elsevier Interactive Patient Education  2017 [employer] Prevention in the Home Falls can cause injuries. They can happen to people of all ages. There are many things you can do to make your home safe and to help prevent falls. What can I do on the outside of my home? Regularly fix the edges of walkways and driveways and fix any cracks. Remove anything that might make you trip as you walk through a door, such as a raised step or threshold. Trim any bushes or trees on the path to your home. Use bright outdoor lighting. Clear any walking paths of anything that might make someone trip, such as rocks or tools. Regularly check to see if handrails are loose or broken. Make sure that both sides of any steps have handrails. Any raised decks and porches should have guardrails on the edges. Have any leaves, snow, or ice cleared regularly. Use sand  or salt on walking paths during winter. Clean up any spills in your garage right away. This includes oil or grease spills. What can I do in the bathroom? Use night lights. Install grab bars by the toilet and in the tub and shower. Do not use towel bars as grab bars. Use non-skid mats or decals in the tub or shower. If you need to sit down in the shower, use a plastic, non-slip stool. Keep the floor dry. Clean up any water that spills on the floor as soon as it happens. Remove soap buildup in the tub or shower regularly. Attach bath mats securely with double-sided non-slip rug tape. Do not have throw rugs and other things on the floor that can make you trip. What can I do in the bedroom? Use night lights. Make sure that you have a light by your bed that is easy to reach. Do not use any sheets or blankets that are too big for your bed. They should not hang down onto the floor. Have a firm chair that has side arms. You can use this for support while you get dressed. Do not have throw rugs and other things on the floor  that can make you trip. What can I do in the kitchen? Clean up any spills right away. Avoid walking on wet floors. Keep items that you use a lot in easy-to-reach places. If you need to reach something above you, use a strong step stool that has a grab bar. Keep electrical cords out of the way. Do not use floor polish or wax that makes floors slippery. If you must use wax, use non-skid floor wax. Do not have throw rugs and other things on the floor that can make you trip. What can I do with my stairs? Do not leave any items on the stairs. Make sure that there are handrails on both sides of the stairs and use them. Fix handrails that are broken or loose. Make sure that handrails are as long as the stairways. Check any carpeting to make sure that it is firmly attached to the stairs. Fix any carpet that is loose or worn. Avoid having throw rugs at the top or bottom of the stairs.  If you do have throw rugs, attach them to the floor with carpet tape. Make sure that you have a light switch at the top of the stairs and the bottom of the stairs. If you do not have them, ask someone to add them for you. What else can I do to help prevent falls? Wear shoes that: Do not have high heels. Have rubber bottoms. Are comfortable and fit you well. Are closed at the toe. Do not wear sandals. If you use a stepladder: Make sure that it is fully opened. Do not climb a closed stepladder. Make sure that both sides of the stepladder are locked into place. Ask someone to hold it for you, if possible. Clearly mark and make sure that you can see: Any grab bars or handrails. First and last steps. Where the edge of each step is. Use tools that help you move around (mobility aids) if they are needed. These include: Canes. Walkers. Scooters. Crutches. Turn on the lights when you go into a dark area. Replace any light bulbs as soon as they burn out. Set up your furniture so you have a clear path. Avoid moving your furniture around. If any of your floors are uneven, fix them. If there are any pets around you, be aware of where they are. Review your medicines with your doctor. Some medicines can make you feel dizzy. This can increase your chance of falling. Ask your doctor what other things that you can do to help prevent falls. This information is not intended to replace advice given to you by your health care provider. Make sure you discuss any questions you have with your health care provider. Document Released: 03/26/2009 Document Revised: 11/05/2015 Document Reviewed: 07/04/2014 Elsevier Interactive Patient Education  2017 [employer].

## 2022-06-09 NOTE — Progress Notes (Signed)
Subjective:   James Lee is a 71 y.o. male who presents for an Initial Medicare Annual Wellness Visit.  Review of Systems    No ROS.  Medicare Wellness Virtual Visit.  Visual/audio telehealth visit, UTA vital signs.   See social history for additional risk factors.   Cardiac Risk Factors include: advanced age (>20mn, >>52women);male gender     Objective:    Today's Vitals   06/09/22 1307  Weight: 268 lb (121.6 kg)  Height: _0  (1.803 m)   Body mass index is 37.38 kg/m.     06/09/2022    1:07 PM 05/04/2022    9:43 AM 05/03/2022    9:55 AM 11/10/2021    1:40 PM 03/09/2018    3:00 PM 03/06/2018    9:29 AM 11/29/2017    8:00 AM  Advanced Directives  Does Patient Have a Medical Advance Directive? No Yes Yes No No Yes No  Type of ACorporate treasurerof [place] will [place] will   Living will   Does patient want to make changes to medical advance directive?      No - Patient declined   Would patient like information on creating a medical advance directive? No - Patient declined   No - Patient declined No - Patient declined  No - Patient declined    Current Medications (verified) Outpatient Encounter Medications as of 06/09/2022  Medication Sig   amLODipine (NORVASC) 5 MG tablet Take 5 mg by mouth daily.   aspirin 81 MG tablet Take 1 tablet (81 mg total) by mouth daily.   chlorthalidone (HYGROTON) 50 MG tablet Take 50 mg by mouth daily.   Continuous Blood Gluc Sensor (FREESTYLE LIBRE 2 SENSOR) MISC by Does not apply route.   docusate sodium (COLACE) 100 MG capsule Take 100 mg by mouth 2 (two) times daily.   erythromycin ophthalmic ointment Place 1 Application into both eyes 2 (two) times daily for 5 days. For 5 days   FARXIGA 10 MG TABS tablet Take 10 mg by mouth daily.   gabapentin (NEURONTIN) 300 MG capsule Take 600 mg by mouth 2 (two) times daily.   glipiZIDE (GLUCOTROL XL) 5 MG 24 hr tablet TAKE 1 TABLET(5 MG) BY  MOUTH DAILY   metFORMIN (GLUCOPHAGE-XR) 500 MG 24 hr tablet Take 1,000 mg by mouth 2 (two) times daily with a meal.   metoprolol succinate (TOPROL-XL) 50 MG 24 hr tablet Take 1 tablet (50 mg total) by mouth daily.   Multiple Vitamins-Minerals (MULTIVITAMIN WITH MINERALS) tablet Take 1 tablet by mouth daily.   olmesartan (BENICAR) 20 MG tablet Take 20 mg by mouth daily.   rosuvastatin (CRESTOR) 10 MG tablet Take 1 tablet (10 mg total) by mouth daily.   Semaglutide (RYBELSUS) 7 MG TABS Take 7 mg by mouth daily. (Patient not taking: Reported on 05/31/2022)   No facility-administered encounter medications on file as of 06/09/2022.    Allergies (verified) Patient has no known allergies.   History: Past Medical History:  Diagnosis Date   Benign essential HTN 09/02/2014   Colon polyps    Coronary artery calcification seen on CAT scan    [place]abetes mellitus [place] complication [place]    Dyslipidemia    Gastric ulcer 11/29/2017   GERD (gastroesophageal reflux disease)    Hyperlipidemia    Hypertension    Lactic acidosis 11/11/2021   LBP (low back pain)    NIDDM-2 with hyperglycemia 11/11/2021   Perforated bowel ([place] 11/29/2017  Prostate cancer [facility]) 2014   prostate    RBBB    Scrotal pain 08/16/2012   Sleep apnea    c-pap wears   Past Surgical History:  Procedure Laterality Date   ABDOMINAL SURGERY     gastric ulcer   back sugery     1996, 2017, 2018   [place]   2017, 2018, 2019   COLONOSCOPY WITH PROPOFOL N/A 05/03/2022   Procedure: COLONOSCOPY WITH PROPOFOL;  Surgeon: [name], MD;  Location: [facility] ENDOSCOPY;  Service: Gastroenterology;  Laterality: N/A;   COLONOSCOPY WITH PROPOFOL N/A 05/04/2022   Procedure: COLONOSCOPY WITH PROPOFOL;  Surgeon: [name], MD;  Location: [facility] ENDOSCOPY;  Service: Gastroenterology;  Laterality: N/A;   gastric ulcers     surgery 11-28-17   INGUINAL HERNIA REPAIR     right   LAPAROSCOPY N/A 11/28/2017   Procedure: LAPAROSCOPY  DIAGNOSTIC converted to open;  Surgeon: Ralene Ok, MD;  Location: [place];  Service: General;  Laterality: N/A;   LAPAROTOMY N/A 11/28/2017   Procedure: EXPLORATORY LAPAROTOMY;  Surgeon: Ralene Ok, MD;  Location: [place];  Service: General;  Laterality: N/A;   MENISCUS REPAIR Right    POSTERIOR CERVICAL FUSION/FORAMINOTOMY N/A 11/10/2017   Procedure: Posterior cervical fusion with lateral mass fixation Cervical Three-Seven, cervical laminectomy Cervical Three- Seven;  Surgeon: Eustace Moore, MD;  Location: [place];  Service: Neurosurgery;  Laterality: N/A;  Posterior cervical fusion with lateral mass fixation Cervical Three-Seven, cervical laminectomy Cervical Three- Seven   PROSTATECTOMY  01/2012   TONSILLECTOMY     TOOTH EXTRACTION     Family History  Problem Relation Age of Onset   Hypertension Mother    Hyperlipidemia Mother    Early death Mother    Drug abuse Mother    Alcohol abuse Mother    Coronary artery disease Mother    Throat cancer Father    Cancer Father    Esophageal cancer Father        ?69 dx - died 64   Diabetes Sister    Depression Sister    Cancer Brother    Prostate cancer Brother    Colon cancer Neg Hx    Rectal cancer Neg Hx    Stomach cancer Neg Hx    Social History   Socioeconomic History   Marital status: Married    Spouse name: Not on file   Number of children: 2   Years of education: Not on file   Highest education level: Not on file  Occupational History   Occupation: Tel Visual merchandiser: [employer]  Tobacco Use   Smoking status: Every Day    Packs/day: 1.00    Years: 40.00    Total pack years: 40.00    Types: Cigarettes    Start date: 03/13/2012   Smokeless tobacco: Never  Vaping Use   Vaping Use: Never used  Substance and Sexual Activity   Alcohol use: Not Currently    Comment: rarely   Drug use: No   Sexual activity: Not on file  Other Topics Concern   Not on file  Social History Narrative   Not on file    Social Determinants of Health   Financial Resource Strain: Low Risk  (06/09/2022)   Overall Financial Resource Strain (CARDIA)    Difficulty of Paying Living Expenses: Not hard at all  Food Insecurity: No Food Insecurity (06/09/2022)   Hunger Vital Sign    Worried About Running Out of Food in  the Last Year: Never true    [place] in the Last Year: Never true  Transportation Needs: No Transportation Needs (06/09/2022)   PRAPARE - Hydrologist (Medical): No    Lack of Transportation (Non-Medical): No  Physical Activity: Unknown (06/09/2022)   Exercise Vital Sign    Days of Exercise per Week: 0 days    Minutes of Exercise per Session: Not on file  Stress: No Stress Concern Present (06/09/2022)   [place]    Feeling of Stress : Not at all  Social Connections: Unknown (06/09/2022)   Social Connection and Isolation Panel [NHANES]    Frequency of Communication with Friends and Family: Not on file    Frequency of Social Gatherings with Friends and Family: Not on file    Attends Religious Services: Not on file    Active Member of Clubs or Organizations: Not on file    Attends Archivist Meetings: Not on file    Marital Status: Married    Tobacco Counseling Ready to quit: Not Answered Counseling given: Not Answered   Clinical Intake:  Pre-visit preparation completed: Yes        Diabetes: Yes (Followed by PCP)  Nutrition Risk Assessment: Does the patient have any non-healing wounds?  No   Diabetes: Is the patient diabetic?  Yes  If diabetic, was a CBG obtained today?  Yes , 42 Did the patient bring in their glucometer from home?  No  How often do you monitor your CBG's? Wears Libre and checks periodically throughout the day.   Diabetes Management: Does the patient want to be seen by Chronic Care Management for management of their diabetes?  No  Would the  patient like to be referred to a Nutritionist or for Diabetic Management?  No   Diabetic Exams: Diabetic foot exam- due. Scheduled with [place]. Annual visits.  Eye Exam-UTD.   How often do you need to have someone help you when you read instructions, pamphlets, or other written materials from your doctor or pharmacy?: 1 - Never    Interpreter Needed?: No    Activities of Daily Living    06/09/2022    1:11 PM  In your present state of health, do you have any difficulty performing the following activities:  Hearing? 0  Vision? 0  Difficulty concentrating or making decisions? 0  Walking or climbing stairs? 1  Dressing or bathing? 0  Doing errands, shopping? 0  Preparing Food and eating ? N  Using the Toilet? N  In the past six months, have you accidently leaked urine? N  Do you have problems with loss of bowel control? N  Managing your Medications? N  Managing your Finances? N  Housekeeping or managing your Housekeeping? N    Patient Care Team: Carollee Leitz, MD as PCP - General (Family Medicine) Sueanne Margarita, MD as PCP - Cardiology (Cardiology) Osker Mason, RPH-CPP (Pharmacist)  Indicate any recent Medical Services you may have received from other than [facility] providers in the past year (date may be approximate).     Assessment:   This is a routine wellness examination for Santhiago.  I connected with  Andres Labrum on 06/09/22 by a audio enabled telemedicine application and verified that I am speaking with the correct person using two identifiers.  Patient Location: Home  Provider Location: Office/Clinic  I discussed the limitations of evaluation and management by telemedicine. The  patient expressed understanding and agreed to proceed.   Hearing/Vision screen Hearing Screening - Comments:: Patient is able to hear conversational tones without difficulty.  No issues reported.   Vision Screening - Comments:: Followed by [facility] currently but plans to  start care with [facility].  Wears corrective lenses They have seen their ophthalmologist in the last 12 months.    Dietary issues and exercise activities discussed: Intensity: Mild He tries to have a healthy diet and good water intake.    Goals Addressed               This Visit's Progress     Patient Stated     Lose weight (pt-stated)        Portion control meals Chair exercises       Depression Screen    06/09/2022    1:16 PM 06/08/2022    8:08 AM 04/11/2022   12:01 PM 04/04/2022    3:02 PM 03/14/2022   11:44 AM 02/24/2022    9:22 AM  PHQ 2/9 Scores  PHQ - 2 Score 1 1 0 1 1 0  PHQ- 9 Score 2 2        Fall Risk    06/09/2022    1:10 PM 06/08/2022    8:08 AM 04/04/2022    3:01 PM 03/14/2022   11:44 AM 02/24/2022    9:22 AM  Fall Risk   Falls in the past year? 0 0 0 1 1  Number falls in past yr: 0 0 0 0 0  Injury with Fall? 0 0 0 0 0  Risk for fall due to :  No Fall Risks No Fall Risks History of fall(s) History of fall(s)  Follow up _0     FALL RISK PREVENTION PERTAINING TO THE HOME: Home free of loose throw rugs in walkways, pet beds, electrical cords, etc? Yes  Adequate lighting in your home to reduce risk of falls? Yes   ASSISTIVE DEVICES UTILIZED TO PREVENT FALLS: Life alert? Yes  Use of a cane, walker or w/c? Yes  Grab bars in the bathroom? Yes  Shower chair or bench in shower? Yes   TIMED UP AND GO: Was the test performed? No .    Cognitive Function:        06/09/2022    1:34 PM  6CIT Screen  What Year? 0 points  What month? 0 points  What time? 0 points  Count back from 20 0 points  Months in reverse 0 points  Repeat phrase 0 points  Total Score 0 points    Immunizations Immunization History  Administered Date(s) Administered   Influenza,inj,Quad PF,6+ Mos 03/10/2018, 02/24/2022    Influenza-Unspecified 06/13/2013   PNEUMOCOCCAL CONJUGATE-20 02/24/2022   Pfizer Covid-19 Vaccine Bivalent Booster 65yr & up 12/26/2020   Pneumococcal-Unspecified 06/13/2014, 10/28/2016   Td 06/18/2014, 10/28/2016   Tdap 06/18/2014   Unspecified SARS-COV-2 Vaccination 07/27/2019, 08/21/2019, 03/31/2020   Zoster Recombinat (Shingrix) 10/28/2016   Covid-19 vaccine status: Completed vaccines x4. Immunization record updated.   Shingrix vaccine- 1st dose done. Agrees to update immunization record once series is completed.   Screening Tests Health Maintenance  Topic Date Due   OPHTHALMOLOGY EXAM  Never done   Diabetic kidney evaluation - Urine ACR  Never done   Lung Cancer Screening  01/28/2016   FOOT EXAM  11/30/2021   COVID-19 Vaccine (5 - 2023-24 season) 06/25/2022 (Originally 02/11/2022)  Zoster Vaccines- Shingrix (2 of 2) 09/08/2022 (Originally 12/23/2016)   HEMOGLOBIN A1C  08/25/2022   Diabetic kidney evaluation - eGFR measurement  02/25/2023   Medicare Annual Wellness (AWV)  06/10/2023   DTaP/Tdap/Td (4 - Td or Tdap) 10/29/2026   COLONOSCOPY (Pts 45-23yr Insurance coverage will need to be confirmed)  05/04/2032   Pneumonia Vaccine 71 Years old  Completed   INFLUENZA VACCINE  Completed   Hepatitis C Screening  Completed   HPV VACCINES  Aged Out   Health Maintenance Health Maintenance Due  Topic Date Due   OPHTHALMOLOGY EXAM  Never done   Diabetic kidney evaluation - Urine ACR  Never done   Lung Cancer Screening  01/28/2016   FOOT EXAM  11/30/2021   DG Chest 2 View- completed 03/15/22.   Hepatitis C Screening: Completed 02/2022.  Vision Screening: Recommended annual ophthalmology exams for early detection of glaucoma and other disorders of the eye.  Dental Screening: Recommended annual dental exams for proper oral hygiene.  Community Resource Referral / Chronic Care Management: CRR required this visit?  No   CCM required this visit?  No      Plan:     I have  personally reviewed and noted the following in the patient's chart:   Medical and social history Use of alcohol, tobacco or illicit drugs  Current medications and supplements including opioid prescriptions. Patient is not currently taking opioid prescriptions. Functional ability and status Nutritional status Physical activity Advanced directives List of other physicians Hospitalizations, surgeries, and ER visits in previous 12 months Vitals Screenings to include cognitive, depression, and falls Referrals and appointments  In addition, I have reviewed and discussed with patient certain preventive protocols, quality metrics, and best practice recommendations. A written personalized care plan for preventive services as well as general preventive health recommendations were provided to patient.     DLeta Jungling LPN   [id number]

## 2022-06-09 NOTE — Telephone Encounter (Signed)
Pt turn in there portion of PAP app. to office and it was sent to [employer] incomplete with out  doctor portion and income with application.  Waiting on company to send responds and then I will proceed. CMA informed me that the documents was sent as well to a batching process and should be in pt media in a couple days. I will follow up by checking to see it PAP application (pt portion has been batched in media.   [facility] Patient Advocate   Please be advised

## 2022-06-10 NOTE — Telephone Encounter (Signed)
Who was the provider that sign off for Dr. Volanda Napoleon.

## 2022-06-10 NOTE — Telephone Encounter (Signed)
James Lee

## 2022-06-10 NOTE — Telephone Encounter (Signed)
Dr. Volanda Napoleon will not be back in the office until Tuesday.

## 2022-06-10 NOTE — Telephone Encounter (Signed)
Printed paperwork off of media and faxed to "ATTENTION NICOLE" at [phone]

## 2022-06-10 NOTE — Telephone Encounter (Signed)
I received your paperwork and since Dr. Volanda Napoleon will not be back until 06/14/22 I asked the doc of the day to sign off on it and faxed it to [phone] "ATTENTION: NICOLE LUCAS.

## 2022-06-10 NOTE — Telephone Encounter (Signed)
FAXED PCP page(s) to office. Please HAVE DR. Volanda Napoleon complete and sign and fax TO [phone] ATTENTION [place] [facility] Patient Advocate

## 2022-06-13 HISTORY — PX: CATARACT EXTRACTION: SUR2

## 2022-06-14 ENCOUNTER — Ambulatory Visit (INDEPENDENT_AMBULATORY_CARE_PROVIDER_SITE_OTHER): Payer: Medicare Other | Admitting: Acute Care

## 2022-06-14 ENCOUNTER — Encounter: Payer: Self-pay | Admitting: Acute Care

## 2022-06-14 DIAGNOSIS — F17210 Nicotine dependence, cigarettes, uncomplicated: Secondary | ICD-10-CM

## 2022-06-14 NOTE — Telephone Encounter (Signed)
Thanks!  Charles Niese L. CPhT Rx Patient Advocate  

## 2022-06-14 NOTE — Progress Notes (Signed)
Virtual Visit via Telephone Note  I connected with James Lee on 06/14/22 at  2:30 PM EST by telephone and verified that I am speaking with the correct person using two identifiers.  Location: Patient:  At home Provider:  [place], [place], [place], [address]    I discussed the limitations, risks, security and privacy concerns of performing an evaluation and management service by telephone and the availability of in person appointments. I also discussed with the patient that there may be a patient responsible charge related to this service. The patient expressed understanding and agreed to proceed.     Shared Decision Making Visit Lung Cancer Screening Program [phone])   Eligibility: Age 72 y.o. Pack Years Smoking History Calculation  75  pack year smoking history (# packs/per year x # years smoked) Recent History of coughing up blood  no Unexplained weight loss? no ( >Than 15 pounds within the last 6 months ) Prior History Lung / other cancer no (Diagnosis within the last 5 years already requiring surveillance chest CT Scans). Smoking Status Current Smoker Former Smokers: Years since quit:  NA  Quit Date:  NA  Visit Components: Discussion included one or more decision making aids. yes Discussion included risk/benefits of screening. yes Discussion included potential follow up diagnostic testing for abnormal scans. yes Discussion included meaning and risk of over diagnosis. yes Discussion included meaning and risk of False Positives. yes Discussion included meaning of total radiation exposure. yes  Counseling Included: Importance of adherence to annual lung cancer LDCT screening. yes Impact of comorbidities on ability to participate in the program. yes Ability and willingness to under diagnostic treatment. yes  Smoking Cessation Counseling: Current Smokers:  Discussed importance of smoking cessation. yes Information about tobacco cessation classes and  interventions provided to patient. yes Patient provided with "ticket" for LDCT Scan. yes Symptomatic Patient. no  Counseling NA Diagnosis Code: Tobacco Use Z72.0 Asymptomatic Patient yes  Counseling (Intermediate counseling: > three minutes counseling) Q6834 Former Smokers:  Discussed the importance of maintaining cigarette abstinence. yes Diagnosis Code: Personal History of Nicotine Dependence. H96.222 Information about tobacco cessation classes and interventions provided to patient. Yes Patient provided with "ticket" for LDCT Scan. yes Written Order for Lung Cancer Screening with LDCT placed in Epic. Yes (CT Chest Lung Cancer Screening Low Dose W/O CM) [id number] Z12.2-Screening of respiratory organs Z87.891-Personal history of nicotine dependence  I have spent 25 minutes of face to face/ virtual visit   time with  James Lee discussing the risks and benefits of lung cancer screening. We viewed / discussed a power point together that explained in detail the above noted topics. We paused at intervals to allow for questions to be asked and answered to ensure understanding.We discussed that the single most powerful action that he can take to decrease his risk of developing lung cancer is to quit smoking. We discussed whether or not he is ready to commit to setting a quit date. We discussed options for tools to aid in quitting smoking including nicotine replacement therapy, non-nicotine medications, support groups, Quit Smart classes, and behavior modification. We discussed that often times setting smaller, more achievable goals, such as eliminating 1 cigarette a day for a week and then 2 cigarettes a day for a week can be helpful in slowly decreasing the number of cigarettes smoked. This allows for a sense of accomplishment as well as providing a clinical benefit. I provided  him  with smoking cessation  information  with contact information  for community resources, classes, free nicotine replacement  therapy, and access to mobile apps, text messaging, and on-line smoking cessation help. I have also provided  him  the office contact information in the event he needs to contact me, or the screening staff. We discussed the time and location of the scan, and that either Doroteo Glassman RN, Joella Prince, RN  or I will call / send a letter with the results within 24-72 hours of receiving them. The patient verbalized understanding of all of  the above and had no further questions upon leaving the office. They have my contact information in the event they have any further questions.  I spent 3 minutes counseling on smoking cessation and the health risks of continued tobacco abuse.  I explained to the patient that there has been a high incidence of coronary artery disease noted on these exams. I explained that this is a non-gated exam therefore degree or severity cannot be determined. This patient is on statin therapy. I have asked the patient to follow-up with their PCP regarding any incidental finding of coronary artery disease and management with diet or medication as their PCP  feels is clinically indicated. The patient verbalized understanding of the above and had no further questions upon completion of the visit.      Magdalen Spatz, NP 06/14/2022

## 2022-06-14 NOTE — Telephone Encounter (Signed)
Submitted application for RYBELSUS to [employer] for patient assistance.   Phone: [phone]  James Lee L. CPhT Rx Patient Advocate  

## 2022-06-14 NOTE — Patient Instructions (Signed)
Thank you for participating in the [facility] Cancer Screening Program. It was our pleasure to meet you today. We will call you with the results of your scan within the next few days. Your scan will be assigned a Lung RADS category score by the physicians reading the scans.  This Lung RADS score determines follow up scanning.  See below for description of categories, and follow up screening recommendations. We will be in touch to schedule your follow up screening annually or based on recommendations of our providers. We will fax a copy of your scan results to your Primary Care Physician, or the physician who referred you to the program, to ensure they have the results. Please call the office if you have any questions or concerns regarding your scanning experience or results.  Our office number is [phone]. Please speak with Denise Phelps, RN. , or  Denise Buckner RN, They are  our Lung Cancer Screening RN.'s If They are unavailable when you call, Please leave a message on the voice mail. We will return your call at our earliest convenience.This voice mail is monitored several times a day.  Remember, if your scan is normal, we will scan you annually as long as you continue to meet the criteria for the program. (Age 55-77, Current smoker or smoker who has quit within the last 15 years). If you are a smoker, remember, quitting is the single most powerful action that you can take to decrease your risk of lung cancer and other pulmonary, breathing related problems. We know quitting is hard, and we are here to help.  Please let us know if there is anything we can do to help you meet your goal of quitting. If you are a former smoker, congratulations. We are proud of you! Remain smoke free! Remember you can refer friends or family members through the number above.  We will screen them to make sure they meet criteria for the program. Thank you for helping us take better care of you by  participating in Lung Screening.  You can receive free nicotine replacement therapy ( patches, gum or mints) by calling 1-800-QUIT NOW. Please call so we can get you on the path to becoming  a non-smoker. I know it is hard, but you can do this!  Lung RADS Categories:  Lung RADS 1: no nodules or definitely non-concerning nodules.  Recommendation is for a repeat annual scan in 12 months.  Lung RADS 2:  nodules that are non-concerning in appearance and behavior with a very low likelihood of becoming an active cancer. Recommendation is for a repeat annual scan in 12 months.  Lung RADS 3: nodules that are probably non-concerning , includes nodules with a low likelihood of becoming an active cancer.  Recommendation is for a 6-month repeat screening scan. Often noted after an upper respiratory illness. We will be in touch to make sure you have no questions, and to schedule your 6-month scan.  Lung RADS 4 A: nodules with concerning findings, recommendation is most often for a follow up scan in 3 months or additional testing based on our provider's assessment of the scan. We will be in touch to make sure you have no questions and to schedule the recommended 3 month follow up scan.  Lung RADS 4 B:  indicates findings that are concerning. We will be in touch with you to schedule additional diagnostic testing based on our provider's  assessment of the scan.  Other options for assistance in smoking cessation (   As covered by your insurance benefits)  Hypnosis for smoking cessation  [employer] [phone]  Acupuncture for smoking cessation  [facility] [phone]   

## 2022-06-15 ENCOUNTER — Ambulatory Visit
Admission: RE | Admit: 2022-06-15 | Discharge: 2022-06-15 | Disposition: A | Discharge status: Home / Self Care | Payer: Medicare Other | Source: Ambulatory Visit | Attending: Acute Care | Admitting: Acute Care

## 2022-06-15 DIAGNOSIS — Z122 Encounter for screening for malignant neoplasm of respiratory organs: Secondary | ICD-10-CM | POA: Diagnosis not present

## 2022-06-15 DIAGNOSIS — Z87891 Personal history of nicotine dependence: Secondary | ICD-10-CM | POA: Diagnosis not present

## 2022-06-15 DIAGNOSIS — F17210 Nicotine dependence, cigarettes, uncomplicated: Secondary | ICD-10-CM | POA: Insufficient documentation

## 2022-06-16 ENCOUNTER — Other Ambulatory Visit: Payer: Self-pay

## 2022-06-16 DIAGNOSIS — Z87891 Personal history of nicotine dependence: Secondary | ICD-10-CM

## 2022-06-16 DIAGNOSIS — F17210 Nicotine dependence, cigarettes, uncomplicated: Secondary | ICD-10-CM

## 2022-06-16 DIAGNOSIS — Z122 Encounter for screening for malignant neoplasm of respiratory organs: Secondary | ICD-10-CM

## 2022-06-17 ENCOUNTER — Encounter: Payer: Self-pay | Admitting: Family Medicine

## 2022-06-17 DIAGNOSIS — N1831 Chronic kidney disease, stage 3a: Secondary | ICD-10-CM | POA: Diagnosis not present

## 2022-06-17 DIAGNOSIS — J449 Chronic obstructive pulmonary disease, unspecified: Secondary | ICD-10-CM | POA: Insufficient documentation

## 2022-06-17 DIAGNOSIS — I700 Atherosclerosis of aorta: Secondary | ICD-10-CM | POA: Insufficient documentation

## 2022-06-17 DIAGNOSIS — J4489 Other specified chronic obstructive pulmonary disease: Secondary | ICD-10-CM | POA: Insufficient documentation

## 2022-06-17 DIAGNOSIS — J439 Emphysema, unspecified: Secondary | ICD-10-CM | POA: Insufficient documentation

## 2022-06-17 MED ORDER — ROSUVASTATIN CALCIUM 10 MG PO TABS: 10.0000 mg | ORAL_TABLET | Freq: Every day | ORAL | 2 refills | Status: DC

## 2022-06-17 NOTE — Telephone Encounter (Signed)
Prescription sent into [facility].

## 2022-06-20 DIAGNOSIS — I1A0 Resistant hypertension: Secondary | ICD-10-CM | POA: Diagnosis not present

## 2022-06-20 DIAGNOSIS — N1831 Chronic kidney disease, stage 3a: Secondary | ICD-10-CM | POA: Diagnosis not present

## 2022-06-20 DIAGNOSIS — I129 Hypertensive chronic kidney disease with stage 1 through stage 4 chronic kidney disease, or unspecified chronic kidney disease: Secondary | ICD-10-CM | POA: Diagnosis not present

## 2022-07-12 ENCOUNTER — Inpatient Hospital Stay: Payer: Medicare Other

## 2022-07-12 ENCOUNTER — Other Ambulatory Visit: Payer: Medicare Other | Admitting: Pharmacist

## 2022-07-12 ENCOUNTER — Encounter: Payer: Self-pay | Admitting: Licensed Clinical Social Worker

## 2022-07-12 ENCOUNTER — Ambulatory Visit: Payer: Medicare Other | Admitting: Family Medicine

## 2022-07-12 ENCOUNTER — Inpatient Hospital Stay: Payer: Medicare Other | Attending: Oncology | Admitting: Licensed Clinical Social Worker

## 2022-07-12 DIAGNOSIS — Z800 Family history of malignant neoplasm of digestive organs: Secondary | ICD-10-CM | POA: Diagnosis not present

## 2022-07-12 DIAGNOSIS — N1831 Chronic kidney disease, stage 3a: Secondary | ICD-10-CM | POA: Diagnosis not present

## 2022-07-12 DIAGNOSIS — Z8042 Family history of malignant neoplasm of prostate: Secondary | ICD-10-CM

## 2022-07-12 DIAGNOSIS — D126 Benign neoplasm of colon, unspecified: Secondary | ICD-10-CM

## 2022-07-12 DIAGNOSIS — C61 Malignant neoplasm of prostate: Secondary | ICD-10-CM | POA: Diagnosis not present

## 2022-07-12 NOTE — Progress Notes (Signed)
REFERRING PROVIDER: [name], MD [address] [place],  [place] [address]  PRIMARY PROVIDER:  Carollee Leitz, MD  PRIMARY REASON FOR VISIT:  1. Adenomatous polyp of colon, unspecified part of colon   2. Cancer of prostate ([place])   3. Family history of prostate cancer   4. Family history of esophageal cancer      HISTORY OF PRESENT ILLNESS:   [name], a 72 y.o. male, was seen for a [place] cancer genetics consultation at the request of Dr. Vicente Males due to a personal history of colon polyps.  [name] presents to clinic today to discuss the possibility of a hereditary predisposition to cancer, genetic testing, and to further clarify his future cancer risks, as well as potential cancer risks for family members. Marland Kitchen    CANCER HISTORY:  In 2014, at the age of 14, [name] was diagnosed with prostate cancer. This was treated with surgery  RISK FACTORS:  Colonoscopy: yes;  patient reports 3 total colonoscopies with polyps on each. In 2023, he had a colonoscopy with Dr. Vicente Males that showed 12 polyps, mix of tubular adenomas and sessile serrated polyps. Patient reports the colonoscopy before that was in 2017 and there were about 6 polyps, and there were polyps on his first colonoscopy as well for a cumulative ~20 polyps Skin exam: Patient has not seen dermatology for skin check    Past Medical History:  Diagnosis Date   Benign essential HTN 09/02/2014   Colon polyps    Coronary artery calcification seen on CAT scan    Diabetes mellitus without complication ([place])    Dyslipidemia    Gastric ulcer 11/29/2017   GERD (gastroesophageal reflux disease)    Hyperlipidemia    Hypertension    Lactic acidosis 11/11/2021   LBP (low back pain)    NIDDM-2 with hyperglycemia 11/11/2021   Perforated bowel ([place]) 11/29/2017   Prostate cancer ([place]) 2014   prostate    RBBB    Scrotal pain 08/16/2012   Sleep apnea    c-pap wears    Past Surgical History:  Procedure Laterality Date   ABDOMINAL  SURGERY     gastric ulcer   back sugery     1996, 2017, 2018   [place]   2017, 2018, 2019   COLONOSCOPY WITH PROPOFOL N/A 05/03/2022   Procedure: COLONOSCOPY WITH PROPOFOL;  Surgeon: [name], MD;  [facility] ENDOSCOPY;  Service: Gastroenterology;  Laterality: N/A;   COLONOSCOPY WITH PROPOFOL N/A 05/04/2022   Procedure: COLONOSCOPY WITH PROPOFOL;  Surgeon: [name], MD;  Location: [facility] ENDOSCOPY;  Service: Gastroenterology;  Laterality: N/A;   gastric ulcers     surgery 11-28-17   INGUINAL HERNIA REPAIR     right   LAPAROSCOPY N/A 11/28/2017   Procedure: LAPAROSCOPY DIAGNOSTIC converted to open;  Surgeon: Ralene Ok, MD;  Location: [facility];  Service: General;  Laterality: N/A;   LAPAROTOMY N/A 11/28/2017   Procedure: EXPLORATORY LAPAROTOMY;  Surgeon: Ralene Ok, MD;  Location: [facility];  Service: General;  Laterality: N/A;   MENISCUS REPAIR Right    POSTERIOR CERVICAL FUSION/FORAMINOTOMY N/A 11/10/2017   Procedure: Posterior cervical fusion with lateral mass fixation Cervical Three-Seven, cervical laminectomy Cervical Three- Seven;  Surgeon: Eustace Moore, MD;  Location: [facility];  Service: Neurosurgery;  Laterality: N/A;  Posterior cervical fusion with lateral mass fixation Cervical Three-Seven, cervical laminectomy Cervical Three- Seven   PROSTATECTOMY  01/2012   TONSILLECTOMY     TOOTH EXTRACTION  FAMILY HISTORY:  We obtained a detailed, 4-generation family history.  Significant diagnoses are listed below: Family History  Problem Relation Age of Onset   Hypertension Mother    Hyperlipidemia Mother    Early death Mother    Drug abuse Mother    Alcohol abuse Mother    Coronary artery disease Mother    Throat cancer Father    Esophageal cancer Father        dx late 34s, d. 52   Diabetes Sister    Depression Sister    Prostate cancer Brother 80   Colon cancer Neg Hx    Rectal cancer Neg Hx    Stomach cancer Neg Hx    [name] has 1 daughter, 30  and 1 son, 32. His son had a colonoscopy recently that showed polyps, unsure amount. [name] also has 1 brother and 1 sister. His brother had prostate cancer at 55.   [name] father had throat and esophageal cancer in his late 35s and passed at 93. Patient does not have information about the rest of his paternal relatives.   [name] mother passed at 83. No known cancers on her side of the family, limited information.   [name] is unaware of previous family history of genetic testing for hereditary cancer risks. There is no reported Ashkenazi Jewish ancestry. There is no known consanguinity.    GENETIC COUNSELING ASSESSMENT: Mr. Murty is a 72 y.o. male with a personal history of colon polyps, and a personal/family history of prostate cancer, which is somewhat suggestive of a hereditary cancer syndrome and predisposition to cancer. We, therefore, discussed and recommended the following at today's visit.   DISCUSSION: We discussed that polyps in general are common, however, most people have fewer than 5 lifetime polyps.  When an individual has 10 or more polyps we become concerned about an underlying polyposis syndrome.  The most common hereditary polyposis syndromes are caused by problems in the APC and MUTYH genes. We also iscussed that approximately 10% of prostate cancer is hereditary. Most cases of hereditary prostate cancer are associated with BRCA1/BRCA2 genes, although there are other genes associated with hereditary cancer as well. Cancers and risks are gene specific. We discussed that testing is beneficial for several reasons including knowing about cancer risks, identifying potential screening and risk-reduction options that may be appropriate, and to understand if other family members could be at risk for cancer and allow them to undergo genetic testing.   We reviewed the characteristics, features and inheritance patterns of hereditary cancer syndromes. We also discussed genetic  testing, including the appropriate family members to test, the process of testing, insurance coverage and turn-around-time for results. We discussed the implications of a negative, positive and/or variant of uncertain significant result. We recommended Mr. Trickett pursue genetic testing for the [facility] Multi-Cancer+RNA gene panel.   Based on Mr. Nickerson personal history of colon polyps he meets medical criteria for genetic testing. Despite that he meets criteria, he may still have an out of pocket cost. We discussed that if his out of pocket cost for testing is over $100, the laboratory will call and confirm whether he wants to proceed with testing.  If the out of pocket cost of testing is less than $100 he will be billed by the genetic testing laboratory.   PLAN: After considering the risks, benefits, and limitations, Mr. Lovern provided informed consent to pursue genetic testing and the blood sample was sent to [facility] for analysis of  the Multi-Cancer+RNA panel. Results should be available within approximately 2-3 weeks' time, at which point they will be disclosed by telephone to Mr. Babich, as will any additional recommendations warranted by these results. Mr. Gullett will receive a summary of his genetic counseling visit and a copy of his results once available. This information will also be available in Epic.   Mr. Kenneth questions were answered to his satisfaction today. Our contact information was provided should additional questions or concerns arise. Thank you for the referral and allowing [place] to share in the care of your patient.   Faith Rogue, MS, [facility] Genetic Counselor [email or web address] Phone: [phone]  The patient was seen for a total of 30 minutes in face-to-face genetic counseling.  Dr. Grayland Ormond was available for discussion regarding this case.   _______________________________________________________________________ For Office Staff:  Number of people  involved in session: 1 Was an Intern/ student involved with case: no

## 2022-07-13 ENCOUNTER — Encounter: Payer: Self-pay | Admitting: Family Medicine

## 2022-07-13 ENCOUNTER — Ambulatory Visit (INDEPENDENT_AMBULATORY_CARE_PROVIDER_SITE_OTHER): Payer: Medicare Other | Admitting: Family Medicine

## 2022-07-13 VITALS — BP 124/76 | HR 87 | Temp 97.8°F | Resp 16 | Ht 71.0 in | Wt 261.5 lb

## 2022-07-13 DIAGNOSIS — E1142 Type 2 diabetes mellitus with diabetic polyneuropathy: Secondary | ICD-10-CM | POA: Diagnosis not present

## 2022-07-13 DIAGNOSIS — I700 Atherosclerosis of aorta: Secondary | ICD-10-CM

## 2022-07-13 DIAGNOSIS — E1122 Type 2 diabetes mellitus with diabetic chronic kidney disease: Secondary | ICD-10-CM

## 2022-07-13 DIAGNOSIS — D126 Benign neoplasm of colon, unspecified: Secondary | ICD-10-CM

## 2022-07-13 DIAGNOSIS — E1159 Type 2 diabetes mellitus with other circulatory complications: Secondary | ICD-10-CM

## 2022-07-13 DIAGNOSIS — G4730 Sleep apnea, unspecified: Secondary | ICD-10-CM

## 2022-07-13 DIAGNOSIS — N1832 Chronic kidney disease, stage 3b: Secondary | ICD-10-CM | POA: Diagnosis not present

## 2022-07-13 DIAGNOSIS — J432 Centrilobular emphysema: Secondary | ICD-10-CM

## 2022-07-13 DIAGNOSIS — Z0100 Encounter for examination of eyes and vision without abnormal findings: Secondary | ICD-10-CM

## 2022-07-13 DIAGNOSIS — E119 Type 2 diabetes mellitus without complications: Secondary | ICD-10-CM

## 2022-07-13 DIAGNOSIS — I152 Hypertension secondary to endocrine disorders: Secondary | ICD-10-CM

## 2022-07-13 DIAGNOSIS — E7800 Pure hypercholesterolemia, unspecified: Secondary | ICD-10-CM

## 2022-07-13 LAB — POCT GLYCOSYLATED HEMOGLOBIN (HGB A1C): Hemoglobin A1C: 7.6 % — AB (ref 4.0–5.6)

## 2022-07-13 LAB — MICROALBUMIN / CREATININE URINE RATIO
Creatinine,U: 114 mg/dL
Microalb Creat Ratio: 126.9 mg/g — ABNORMAL HIGH (ref 0.0–30.0)
Microalb, Ur: 144.7 mg/dL — ABNORMAL HIGH (ref 0.0–1.9)

## 2022-07-13 NOTE — Assessment & Plan Note (Signed)
Chronic.  Stable. On statins and continues to have no myalgias.   Continue Crestor 10 mg daily Repeat lipids at next visit

## 2022-07-13 NOTE — Patient Instructions (Addendum)
It was a pleasure meeting you today. Thank you for allowing me to take part in your health care.  Our goals for today as we discussed include:  Annual Foot Exam due.  Follow up  with [facility] as scheduled  Feb 22   Call to schedule an eye appointment Dr Sherie Don MD Address: [address] [place], [place] [address] Hours:  Open ? Closes 5?PM Phone: [phone]  Finish the 3 mg of Rybelsus tablets you have then start 7 mg daily. Giving you 2-monthsupply.  Hopefully your paperwork will come through before you have run out of medication.  Please notify me and do not wait until you have run out of medications.  Will obtain your A1c today.  Will send referral to pulmonology for evaluation of your CPAP machine and possible repeat sleep studies.  Follow-up in 3 months  If you have any questions or concerns, please do not hesitate to call the office at [phone]  I look forward to our next visit and until then take care and stay safe.  Regards,   TCarollee Leitz MD   [facility]

## 2022-07-13 NOTE — Progress Notes (Signed)
SUBJECTIVE:   Chief Complaint  Patient presents with   Diabetes   HPI Presents for follow up for DM . Seen in clinic on 04/11/2022.  Diabetes type 2 Asymptomatic.  Doing well since starting on Rybelsus 3 mg daily.  Was increased to 7 mg on 11/14 however unable to obtain secondary to financial constraints.  Picked up sample 3 mg from clinic on 12/27.  Since then he has been taking 3 mg every other day.  Reports blood glucose at home 160s.  Has had 1 high in 300s and 1 low 68.  Asymptomatic both times.  Otherwise feels doing well and tolerating medication.   Hypertension Asymptomatic.  Recently blood work done 01/30.  Follows with cardiology Dr. Radford Pax, and nephrology, Dr. Osborne Casco.  Currently takes chlorthalidone 50 mg daily, metoprolol 50 mg daily, Benicar 40 mg daily and tolerating medications well.  Does not check blood pressure at home.  Denies any headaches, visual changes, chest pain or worsening shortness of breath.  No lower extremity edema.  Sleep apnea Endorses sleep apnea for years.  Requesting repeat sleep studies as machine is quite old and would like to see if may need an adjustment.  Reports only sleeps 4 hours a night.  Compliant with CPAP.  Colon cancer screening Recent colonoscopy showed multiple serrated and tubular adenomas.  Some tubular adenomas high grade dysplasia.  Was recommended to be evaluated by genetics whom he had an appointment with 01/30.  Reports has follow-up colonoscopy in 6 months.  Continues to follow with GI.   PERTINENT PMH / PSH: Diabetes type 2 CKD stage III Hypertension Sleep apnea Emphysema Hyperlipidemia Adenomatous polyps   OBJECTIVE:  BP 124/76   Pulse 87   Temp 97.8 F (36.6 C)   Resp 16   Ht '5\' 11"'$  (1.803 m)   Wt 261 lb 8 oz (118.6 kg)   SpO2 98%   BMI 36.47 kg/m    Physical Exam Vitals reviewed.  Constitutional:      General: He is not in acute distress.    Appearance: Normal appearance. He is normal weight. He is  not ill-appearing, toxic-appearing or diaphoretic.  HENT:     Head: Normocephalic.     Mouth/Throat:     Mouth: Mucous membranes are moist.  Eyes:     General:        Right eye: No discharge.        Left eye: No discharge.  Cardiovascular:     Rate and Rhythm: Normal rate and regular rhythm.     Heart sounds: Normal heart sounds.  Pulmonary:     Effort: Pulmonary effort is normal.     Breath sounds: Normal breath sounds.  Abdominal:     General: Bowel sounds are normal.     Tenderness: There is no abdominal tenderness.  Musculoskeletal:        General: Normal range of motion.     Cervical back: Normal range of motion.  Skin:    General: Skin is warm and dry.  Neurological:     Mental Status: He is alert and oriented to person, place, and time. Mental status is at baseline.  Psychiatric:        Mood and Affect: Mood normal.        Behavior: Behavior normal.        Thought Content: Thought content normal.        Judgment: Judgment normal.     ASSESSMENT/PLAN:  Type 2 diabetes mellitus with stage 3b  chronic kidney disease, without long-term current use of insulin (HCC) Assessment & Plan: Chronic.  Recent GFR 43.  A1c today 7.6. -Continue Farxiga 10 mg daily -Request patient to have records faxed from Nephrology. -Urine ACR if not already completed -Continue Metformin  1000 mg  BID -Continue Glipizide 5 mg daily -Increase Rybelsus 7 mg daily, supply patient with 2 months samples -Pharmacy working on financial assistance for Rybelsus -Continue Crestor 10 mg daily -Ophthalmology referral previously sent x 2.  Patient was given   office number to call and schedule appointment -Follow-up with nephrology as scheduled -Follow up in 3 months    Orders: -     Microalbumin / creatinine urine ratio -     POCT glycosylated hemoglobin (Hb A1C)  Hypertension associated with diabetes ([place]) Assessment & Plan: Chronic.  Stable.  Initially elevated 132/76.  Repeat at goal goal  <130/80.  -Continue Amlodipine 5 mg daily -Continue Benicar 40 mg daily -Continue Chlorthalidone 50 mg daily -Continue Metoprolol 50 mg daily -Continue to monitor BP at home  -Follows with nephrology and cardiology.     Aortic atherosclerosis (HCC) Assessment & Plan: Noted on CT chest. Recent LDL less than 100, goal less than 70 On statin therapy and tolerating well.  No myalgias. Continue Crestor 10 mg daily Consider Zetia next visit.    Diabetic polyneuropathy associated with type 2 diabetes mellitus ([place]) Assessment & Plan: Chronic.  Stable. Continue Gabapentin 600 mg twice daily If worsening symptoms can increase dosing of gabapentin to maximum 1800 mg/24h, Creatinine clearance 51. Appointment with podiatry, February 22    Centrilobular emphysema [facility]) Assessment & Plan: Chronic.  Stable.  Uses albuterol inhaler as needed. If worsening symptoms can refer to pulmonology Encourage smoking cessation.   Diabetic eye exam [facility]) Assessment & Plan: Again provided eye clinic number for patient to call and schedule appointment.  2 referrals have previously been placed for diabetic eye exams.   Pure hypercholesterolemia Assessment & Plan: Chronic.  Stable. On statins and continues to have no myalgias.   Continue Crestor 10 mg daily Repeat lipids at next visit   Adenomatous polyp of colon, unspecified part of colon Assessment & Plan: Recent colonoscopy shows multiple polyps, donation of serrated and tubular adenomas some without dysplasia and tubular adenomas with high grade dysplasia. He had genetic evaluation.  GI recommend repeat colonoscopy in 6 months. Continue to follow up with GI   Sleep apnea, unspecified type Assessment & Plan: Chronic.  Stable.   Requesting repeat studies for new machine.  Wondering if settings are not appropriate.  Only sleeps 4 hours a night. Referral sent to pulmonology   Orders: -     Ambulatory referral to Pulmonology   PDMP  reviewed  Return in about 3 months (around 10/11/2022) for PCP, diabetic check.  Carollee Leitz, MD

## 2022-07-13 NOTE — Assessment & Plan Note (Signed)
Again provided eye clinic number for patient to call and schedule appointment.  2 referrals have previously been placed for diabetic eye exams.

## 2022-07-13 NOTE — Assessment & Plan Note (Addendum)
Chronic.  Stable.  Uses albuterol inhaler as needed. If worsening symptoms can refer to pulmonology Encourage smoking cessation.

## 2022-07-13 NOTE — Assessment & Plan Note (Addendum)
Chronic.  Stable.  Initially elevated 132/76.  Repeat at goal goal <130/80.  -Continue Amlodipine 5 mg daily -Continue Benicar 40 mg daily -Continue Chlorthalidone 50 mg daily -Continue Metoprolol 50 mg daily -Continue to monitor BP at home  -Follows with nephrology and cardiology.

## 2022-07-13 NOTE — Assessment & Plan Note (Signed)
Chronic.  Stable. Continue Gabapentin 600 mg twice daily If worsening symptoms can increase dosing of gabapentin to maximum 1800 mg/24h, Creatinine clearance 51. Appointment with podiatry, February 22

## 2022-07-13 NOTE — Assessment & Plan Note (Signed)
Recent colonoscopy shows multiple polyps, donation of serrated and tubular adenomas some without dysplasia and tubular adenomas with high grade dysplasia. He had genetic evaluation.  GI recommend repeat colonoscopy in 6 months. Continue to follow up with GI

## 2022-07-13 NOTE — Assessment & Plan Note (Signed)
Chronic.  Stable.   Requesting repeat studies for new machine.  Wondering if settings are not appropriate.  Only sleeps 4 hours a night. Referral sent to pulmonology

## 2022-07-13 NOTE — Assessment & Plan Note (Addendum)
Chronic.  Recent GFR 43.  A1c today 7.6. -Continue Farxiga 10 mg daily -Request patient to have records faxed from Nephrology. -Urine ACR if not already completed -Continue Metformin  1000 mg  BID -Continue Glipizide 5 mg daily -Increase Rybelsus 7 mg daily, supply patient with 2 months samples -Pharmacy working on financial assistance for Rybelsus -Continue Crestor 10 mg daily -Ophthalmology referral previously sent x 2.  Patient was given   office number to call and schedule appointment -Follow-up with nephrology as scheduled -Follow up in 3 months

## 2022-07-13 NOTE — Assessment & Plan Note (Signed)
Noted on CT chest. Recent LDL less than 100, goal less than 70 On statin therapy and tolerating well.  No myalgias. Continue Crestor 10 mg daily Consider Zetia next visit.

## 2022-07-25 DIAGNOSIS — H532 Diplopia: Secondary | ICD-10-CM | POA: Diagnosis not present

## 2022-07-25 DIAGNOSIS — H2513 Age-related nuclear cataract, bilateral: Secondary | ICD-10-CM | POA: Diagnosis not present

## 2022-07-25 DIAGNOSIS — H5000 Unspecified esotropia: Secondary | ICD-10-CM | POA: Diagnosis not present

## 2022-07-25 DIAGNOSIS — E119 Type 2 diabetes mellitus without complications: Secondary | ICD-10-CM | POA: Diagnosis not present

## 2022-07-25 LAB — HM DIABETES EYE EXAM

## 2022-07-26 ENCOUNTER — Telehealth: Payer: Self-pay

## 2022-07-26 NOTE — Telephone Encounter (Signed)
Left message to call the office back regarding Rybelsus. Patient received 4 boxes of 57m Rybelsus tablets [facility] [employer].

## 2022-07-28 NOTE — Telephone Encounter (Signed)
Pt returned phone call. Message below was read to him. Pt aware. Will pick up med anytime when available.

## 2022-07-29 ENCOUNTER — Encounter: Payer: Self-pay | Admitting: Licensed Clinical Social Worker

## 2022-07-29 ENCOUNTER — Ambulatory Visit: Payer: Self-pay | Admitting: Licensed Clinical Social Worker

## 2022-07-29 ENCOUNTER — Telehealth: Payer: Self-pay | Admitting: Licensed Clinical Social Worker

## 2022-07-29 DIAGNOSIS — Z1379 Encounter for other screening for genetic and chromosomal anomalies: Secondary | ICD-10-CM | POA: Insufficient documentation

## 2022-07-29 DIAGNOSIS — Z1503 Genetic susceptibility to malignant neoplasm of prostate: Secondary | ICD-10-CM | POA: Insufficient documentation

## 2022-07-29 HISTORY — DX: Encounter for other screening for genetic and chromosomal anomalies: Z13.79

## 2022-07-29 NOTE — Progress Notes (Signed)
Genetic Test Results - HOXB13  James Lee was previously seen in the [facility] due to a personal history of colon polyps, personal and family history of cancer, and concerns regarding a hereditary predisposition to cancer. Please refer to our prior cancer genetics clinic note for more information regarding our discussion, assessment and recommendations, at the time. James Lee recent genetic test results were disclosed to him, as were recommendations warranted by these results. These results and recommendations are discussed in more detail below.  CANCER HISTORY:  Oncology History   No history exists.    FAMILY HISTORY:  We obtained a detailed, 4-generation family history.  Significant diagnoses are listed below: Family History  Problem Relation Age of Onset   Hypertension Mother    Hyperlipidemia Mother    Early death Mother    Drug abuse Mother    Alcohol abuse Mother    Coronary artery disease Mother    Throat cancer Father    Esophageal cancer Father        dx late 41s, d. 2   Diabetes Sister    Depression Sister    Prostate cancer Brother 75   Colon cancer Neg Hx    Rectal cancer Neg Hx    Stomach cancer Neg Hx    James Lee has 1 daughter, 75 and 1 son, 55. His son had a colonoscopy recently that showed polyps, unsure amount. James Lee also has 1 brother and 1 sister. His brother had prostate cancer at 28.    James Lee father had throat and esophageal cancer in his late 79s and passed at 64. Patient does not have information about the rest of his paternal relatives.    James Lee mother passed at 45. No known cancers on her side of the family, limited information.    James Lee is unaware of previous family history of genetic testing for hereditary cancer risks. There is no reported Ashkenazi Jewish ancestry. There is no known consanguinity.      GENETIC TEST RESULTS:  Genetic testing reported out on 07/25/2022 through the Multi- Cancer Panel  offered by [facility]. 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  The Multi-Cancer + RNA Panel offered by [facility] includes sequencing and/or deletion/duplication analysis of the following 70 genes:  AIP*, ALK, APC*, ATM*, AXIN2*, BAP1*, BARD1*, BLM*, BMPR1A*, BRCA1*, BRCA2*, BRIP1*, CDC73*, CDH1*, CDK4, CDKN1B*, CDKN2A, CHEK2*, CTNNA1*, DICER1*, EPCAM, EGFR, FH*, FLCN*, GREM1, HOXB13, KIT, LZTR1, MAX*, MBD4, MEN1*, MET, MITF, MLH1*, MSH2*, MSH3*, MSH6*, MUTYH*, NF1*, NF2*, NTHL1*, PALB2*, PDGFRA, PMS2*, POLD1*, POLE*, POT1*, PRKAR1A*, PTCH1*, PTEN*, RAD51C*, RAD51D*, RB1*, RET, SDHA*, SDHAF2*, SDHB*, SDHC*, SDHD*, SMAD4*, SMARCA4*, SMARCB1*, SMARCE1*, STK11*, SUFU*, TMEM127*, TP53*, TSC1*, TSC2*, VHL*. RNA analysis is performed for * genes.  he test report will be scanned into EPIC and located under the Molecular Pathology section of the Results Review tab.  A portion of the result report is included below for reference.     Genetic testing identified a variant of uncertain significance (VUS) in the MSH2 gene called c.2260A>T.  At this time, it is unknown if this variant is associated with an increased risk for cancer or if it is benign, but most uncertain variants are reclassified to benign. It should not be used to make medical management decisions. With time, we suspect the laboratory will determine the significance of this variant, if any. If the laboratory reclassifies this variant, we will attempt to contact James Lee to discuss it  further.   James Lee genetic test results do not explain why he has a personal history of colon polyps. We discussed that because current genetic testing is not perfect, it is possible there may be a gene mutation in one of these genes that current testing cannot detect, but that chance is small. We also discussed that there could be another gene that has not yet been discovered, or that we have not yet tested,  that is responsible for the cancer diagnoses in the family or his polyps.  Therefore, it is important to remain in touch with cancer genetics in the future so that we can continue to offer James Lee the most up to date genetic testing.  HOXB13 CANCER RISKS & RECOMMENDATIONS:  Numerous studies have shown that the c.251G>A (p.Gly84Glu) variant in HOXB13, also known as G84E, is associated with an increased risk of prostate cancer (PMID: [id number], [id number], [id number], FE:[id number], NQ:2776715, [id number], HU:4312091, ML:6477780, CW:5628286, XV:8831143, WD:254984). This variant is associated with earlier-onset diagnosis (<55 years) and individuals with this variant are more likely to have a positive family history of prostate cancer (PMID: [id number], [id number], WD:254984, ML:6477780, XV:8831143, DT:9735469).  Large case control studies and meta-analysis across studies demonstrated that men with this variant have approximately a 33-60% lifetime risk of prostate cancer compared to the general population risk of 12% (PMID: [id number], SS:6686271, XV:8831143, AP:8884042, WD:254984). An individual with this variant will not necessarily develop cancer in his lifetime, but the risk is increased over the general population risk. There may also be an increased risk for other cancer types, however, the current evidence is limited and conflicting (PMID: [id number], CB:4811055, VN:1371143, CW:5628286, AP:8884042).  Management:  The National Comprehensive Cancer Network (NCCN) Prostate Cancer Early Detection Guidelines (Version [phone]) recommend the following prostate cancer screening for individuals with the HOXB13 G84E variant:  Annual PSA screening at age 84 years or 10 years prior to the youngest prostate cancer diagnosed in the family. Strongly consider baseline digital rectal examination.  An individual's cancer risk and medical management are not determined by genetic test results alone. Overall cancer risk assessment incorporates additional factors, including  personal medical history, family history, and any available genetic information that may result in a personalized plan for cancer prevention and surveillance.  Even though data regarding HOXB13 is limited, knowing if the G84E variant is present is advantageous. At-risk relatives can be identified, enabling pursuit of a diagnostic evaluation. Further, the available information regarding hereditary cancer susceptibility genes is constantly evolving and more clinically relevant data regarding HOXB13 are likely to become available in the near future. Awareness of this cancer predisposition encourages patients and their providers to inform at-risk family members, to diligently follow standard screening protocols, and to be vigilant in maintaining close and regular contact with their local genetics clinic in anticipation of new information.  Colorectal cancer screening: This negative genetic test simply tells [place] that we cannot yet define why James Lee has had  an increased number of colorectal polyps.  James Lee medical management and screening should be based on the prospect that he  will likely form more colon polyps and should, therefore, undergo more frequent colonoscopy screening at intervals determined by his GI providers.  We also recommended that James Lee have an upper endoscopy periodically.  FAMILY MEMBERS:  Since we now know the mutation in James Lee, we can test at-risk relatives to determine whether or not they have inherited the mutation and are at increased risk for cancer.  It is important that  all of James Lee relatives (both men and women) know of the presence of this gene mutation. Site-specific genetic testing can sort out who in the family is at risk and who is not. We will be happy to meet with any of the family members or refer them to a genetic counselor in their local area. To locate genetic counselors in other cities, individuals can visit [email or web address] and search for a  counselor by zip code.   James Lee children and siblings have a 50% chance to have inherited this mutation. We recommend they have genetic testing for this same mutation, as identifying the presence of this mutation would allow them to also take advantage of risk-reducing measures.   PLAN:  These results will be made available to his referring provider and his PCP, Dr. Carollee Leitz. He would like Dr. Volanda Lee to follow him long-term for this indication. James Lee plans to share these results with his family members so they can consider testing.  RESOURCES:   Vivia Ewing-  provides a way for researchers and patients to link together.  [employer] also provides a biannual newsletter that provides updated information about hereditary cancer genes and syndromes.  We strongly encouraged Mr. Sydow to remain in contact with [place] in cancer genetics on an annual basis so we can update Mr. Hammerschmidt personal and family histories, and inform him of advances in cancer genetics that may be of benefit for the entire family. Mr. Baerwald knows he is also welcome to call with any questions or concerns, at any time.    Faith Rogue, MS, [facility] Genetic Counselor [email or web address] Phone: [phone]

## 2022-07-29 NOTE — Telephone Encounter (Signed)
I contacted Mr. Nonaka to discuss his genetic testing results. Pathogenic variant in HOXB13 called c.251G>A (p.Gly84Glu) identified.  Detailed clinic note to follow.   The test report has been scanned into EPIC and is located under the Molecular Pathology section of the Results Review tab.  A portion of the result report is included below for reference.    Faith Rogue, MS, [facility] Genetic Counselor [email or web address] Phone: [phone]

## 2022-08-04 ENCOUNTER — Ambulatory Visit: Payer: Medicare Other | Admitting: Podiatry

## 2022-08-04 ENCOUNTER — Encounter: Payer: Self-pay | Admitting: Podiatry

## 2022-08-04 VITALS — BP 162/81 | HR 80

## 2022-08-04 DIAGNOSIS — B351 Tinea unguium: Secondary | ICD-10-CM | POA: Diagnosis not present

## 2022-08-04 DIAGNOSIS — M79674 Pain in right toe(s): Secondary | ICD-10-CM

## 2022-08-04 DIAGNOSIS — M79675 Pain in left toe(s): Secondary | ICD-10-CM | POA: Insufficient documentation

## 2022-08-04 DIAGNOSIS — E1142 Type 2 diabetes mellitus with diabetic polyneuropathy: Secondary | ICD-10-CM

## 2022-08-04 NOTE — Progress Notes (Signed)
This patient returns to the office for foot evaluation.  His last visit was in 2022.  He says he has neuropathy in his feet which have been better using cream from the compounding pharmacy in [place].  He also says he needs his long thick nails treated since he cannot trim the nails himself.  He presents for evaluation and treatment.  General Appearance  Alert, conversant and in no acute stress.  Vascular  Dorsalis pedis and posterior tibial  pulses are palpable  bilaterally.  Capillary return is within normal limits  bilaterally. Temperature is within normal limits  bilaterally.  Neurologic  Senn-Weinstein monofilament wire test within normal limits  bilaterally. Muscle power within normal limits bilaterally.  Nails Thick disfigured discolored nails with subungual debris  from hallux to fifth toes bilaterally. No evidence of bacterial infection or drainage bilaterally.  Orthopedic  No limitations of motion  feet .  No crepitus or effusions noted.  No bony pathology or digital deformities noted.  Skin  normotropic skin with no porokeratosis noted bilaterally.  No signs of infections or ulcers noted.     Diabetic neuropathy   Onychomycosis  B/L.  ROV>  Prescribed additional compounding cream for his neuropathy.  Debride nails with nail nipper and dremel tool.   RTC prn.  Gardiner Barefoot DPM

## 2022-08-08 ENCOUNTER — Telehealth: Payer: Self-pay | Admitting: Family Medicine

## 2022-08-08 NOTE — Telephone Encounter (Signed)
Prescription Request  08/08/2022  Is this a "Controlled Substance" medicine? No  LOV: 07/13/2022  What is the name of the medication or equipment? FreeStyle Libre II Sensor System   Have you contacted your pharmacy to request a refill? No   Which pharmacy would you like this sent to?  [facility] [place], [address] [place] [address] Phone: [phone] Fax: [phone]    Patient notified that their request is being sent to the clinical staff for review and that they should receive a response within 2 business days.   Please advise at Mobile [phone] (mobile)

## 2022-08-09 ENCOUNTER — Other Ambulatory Visit: Payer: Self-pay

## 2022-08-09 MED ORDER — FREESTYLE LIBRE 2 SENSOR MISC: 5 refills | Status: DC

## 2022-08-09 NOTE — Telephone Encounter (Signed)
sent 

## 2022-08-22 DIAGNOSIS — E1122 Type 2 diabetes mellitus with diabetic chronic kidney disease: Secondary | ICD-10-CM | POA: Diagnosis not present

## 2022-08-22 NOTE — Telephone Encounter (Signed)
Received notification from [place] regarding approval for RYBELSUS . Patient assistance approved from 07/14/2022 to 06/13/2023.  Phone: [facility] [phone]

## 2022-08-22 NOTE — Telephone Encounter (Signed)
Pt advised.

## 2022-08-29 ENCOUNTER — Telehealth: Payer: Self-pay

## 2022-08-29 NOTE — Telephone Encounter (Signed)
Spoke to patient and requested copy of sleep study. He stated that he does not have record of sleep study. Sleep study was >10y ago.

## 2022-08-30 ENCOUNTER — Ambulatory Visit (INDEPENDENT_AMBULATORY_CARE_PROVIDER_SITE_OTHER): Payer: Medicare Other | Admitting: Internal Medicine

## 2022-08-30 ENCOUNTER — Encounter: Payer: Self-pay | Admitting: Internal Medicine

## 2022-08-30 ENCOUNTER — Telehealth: Payer: Self-pay | Admitting: Cardiology

## 2022-08-30 VITALS — BP 136/78 | HR 80 | Temp 97.7°F | Ht 71.0 in | Wt 263.6 lb

## 2022-08-30 DIAGNOSIS — F17210 Nicotine dependence, cigarettes, uncomplicated: Secondary | ICD-10-CM

## 2022-08-30 DIAGNOSIS — J449 Chronic obstructive pulmonary disease, unspecified: Secondary | ICD-10-CM

## 2022-08-30 DIAGNOSIS — I700 Atherosclerosis of aorta: Secondary | ICD-10-CM | POA: Diagnosis not present

## 2022-08-30 DIAGNOSIS — G4733 Obstructive sleep apnea (adult) (pediatric): Secondary | ICD-10-CM | POA: Diagnosis not present

## 2022-08-30 DIAGNOSIS — Z720 Tobacco use: Secondary | ICD-10-CM

## 2022-08-30 MED ORDER — TRELEGY ELLIPTA 200-62.5-25 MCG/ACT IN AEPB: 1.0000 | INHALATION_SPRAY | Freq: Every day | RESPIRATORY_TRACT | 0 refills | Status: DC

## 2022-08-30 MED ORDER — TRELEGY ELLIPTA 200-62.5-25 MCG/ACT IN AEPB: 1.0000 | INHALATION_SPRAY | Freq: Every day | RESPIRATORY_TRACT | 5 refills | Status: DC

## 2022-08-30 NOTE — Patient Instructions (Addendum)
REGARDING SLEEP APNEA Recommend in lab split-night sleep study for further evaluation  PLEASE STOP SMOKING!  REGARDING COPD WILL START TRELEGY INHALER 1 PUFF DAILY ALBUTEROL AS NEEDED  CONTINUE LUNG CANCER CT SCANS EVERY YEAR  REFERRAL TO  CARDIOLOGY HERE IN [place]  RECOMMEND WEIGHT LOSS

## 2022-08-30 NOTE — Telephone Encounter (Signed)
Patient is requesting to switch from Dr. Radford Pax to a provider in [place] because the location is more convenient. Are either of you willing to take him on as a patient?  Please advise, Thank you

## 2022-08-30 NOTE — Progress Notes (Signed)
Name: James Lee MRN: [id number] DOB: 12-Feb-1951    CHIEF COMPLAINT:  Assessment of OSA   HISTORY OF PRESENT ILLNESS: Patient is seen today for problems and issues with sleep related to excessive daytime sleepiness Patient  has been having sleep problems for many years Patient has been having excessive daytime sleepiness for a long time Patient has been having extreme fatigue and tiredness, lack of energy +  very Loud snoring every night + struggling breathe at night and gasps for air + morning headaches + Nonrefreshing sleep  Patient diagnosed with severe sleep apnea 20 years ago Patient has been using a CPAP machine however does not have a download to assess therapy  Patient says he uses and benefits from therapy    Discussed sleep data and reviewed with patient.  Encouraged proper weight management.  Discussed driving precautions and its relationship with hypersomnolence.  Discussed operating dangerous equipment and its relationship with hypersomnolence.  Discussed sleep hygiene, and benefits of a fixed sleep waked time.  The importance of getting eight or more hours of sleep discussed with patient.  Discussed limiting the use of the computer and television before bedtime.  Decrease naps during the day, so night time sleep will become enhanced.  Limit caffeine, and sleep deprivation.  HTN, stroke, and heart failure are potential risk factors.    EPWORTH SLEEP SCORE 11   Patient has a diagnosis of COPD Uses albuterol as needed  Actively smokes Patient has signs symptoms of chronic cough with sputum production consistent with chronic bronchitis  Patient also sees cardiology for coronary artery disease Will make referral to see someone in [place]   Smoking Assessment and Cessation Counseling Upon further questioning, Patient smokes 1 ppd I have advised patient to quit/stop smoking as soon as possible due to high risk for multiple medical problems  Patient is  NOT willing to quit smoking  I have advised patient that we can assist and have options of Nicotine replacement therapy. I also advised patient on behavioral therapy and can provide oral medication therapy in conjunction with the other therapies Follow up next Office visit  for assessment of smoking cessation Smoking cessation counseling advised for 4 minutes   PAST MEDICAL HISTORY :   has a past medical history of Bacterial conjunctivitis of both eyes (02/27/2022), Benign essential HTN (09/02/2014), Bronchitis (03/14/2022), Cancer of prostate ([place]) (03/08/2011), Colon cancer screening (05/04/2022), Colon polyps, Coronary artery calcification seen on CAT scan, Coronary artery calcification seen on CAT scan (09/02/2014), Diabetes mellitus without complication ([place]), Dyslipidemia, Elevated serum creatinine (11/11/2021), Gastric ulcer (11/29/2017), GERD (gastroesophageal reflux disease), History of colonic polyps (05/03/2022), Hyperlipidemia, Hypertension, Lactic acidosis (11/11/2021), LBP (low back pain), Male erectile dysfunction, unspecified (06/21/2011), NIDDM-2 with hyperglycemia (11/11/2021), Perforated bowel ([place]) (11/29/2017), Personal history of prostate cancer (08/16/2012), Prostate cancer ([place]) (2014), RBBB, RBBB (04/27/2021), Reduced libido (08/16/2012), Scrotal pain (08/16/2012), Sleep apnea, and Syncope (11/10/2021).  has a past surgical history that includes Prostatectomy (01/2012); Meniscus repair (Right); Tonsillectomy; Tooth extraction; Posterior cervical fusion/foraminotomy (N/A, 11/10/2017); laparoscopy (N/A, 11/28/2017); laparotomy (N/A, 11/28/2017); Abdominal surgery; gastric ulcers; Inguinal hernia repair; back sugery; Back surgery (1996); Colonoscopy with propofol (N/A, 05/03/2022); and Colonoscopy with propofol (N/A, 05/04/2022). Prior to Admission medications   Medication Sig Start Date End Date Taking? Authorizing Provider  amLODipine (NORVASC) 5 MG tablet Take 5 mg by mouth daily.     [provider]  aspirin 81 MG tablet Take 1 tablet (81 mg total) by mouth daily. 03/16/18   Costella, Vista Mink,  PA-C  chlorthalidone (HYGROTON) 50 MG tablet Take 50 mg by mouth daily. 12/31/21   [provider]  Continuous Blood Gluc Sensor (FREESTYLE LIBRE 2 SENSOR) MISC Place 1 sensor to skin every 14 days 08/09/22   Carollee Leitz, MD  docusate sodium (COLACE) 100 MG capsule Take 100 mg by mouth daily.    [provider]  FARXIGA 10 MG TABS tablet Take 10 mg by mouth daily. 12/22/21   [provider]  gabapentin (NEURONTIN) 300 MG capsule Take 600 mg by mouth 2 (two) times daily. 10/08/15   [provider]  glipiZIDE (GLUCOTROL XL) 5 MG 24 hr tablet TAKE 1 TABLET(5 MG) BY MOUTH DAILY 04/05/22   Carollee Leitz, MD  metFORMIN (GLUCOPHAGE-XR) 500 MG 24 hr tablet Take 1,000 mg by mouth 2 (two) times daily with a meal.    [provider]  metoprolol succinate (TOPROL-XL) 50 MG 24 hr tablet Take 1 tablet (50 mg total) by mouth daily. 05/30/22   Sueanne Margarita, MD  Multiple Vitamins-Minerals (MULTIVITAMIN WITH MINERALS) tablet Take 1 tablet by mouth daily.    [provider]  olmesartan (BENICAR) 40 MG tablet Take 40 mg by mouth daily. 06/20/22   [provider]  rosuvastatin (CRESTOR) 10 MG tablet Take 1 tablet (10 mg total) by mouth daily. 06/17/22   Carollee Leitz, MD  Semaglutide (RYBELSUS) 7 MG TABS Take 7 mg by mouth daily. 04/26/22   Carollee Leitz, MD   No Known Allergies  FAMILY HISTORY:  family history includes Alcohol abuse in his mother; Coronary artery disease in his mother; Depression in his sister; Diabetes in his sister; Drug abuse in his mother; Early death in his mother; Esophageal cancer in his father; Hyperlipidemia in his mother; Hypertension in his mother; Prostate cancer (age of onset: 38) in his brother; Throat cancer in his father. SOCIAL HISTORY:  reports that he has been smoking cigarettes. He started smoking about  10 years ago. He has a 75.00 pack-year smoking history. He has never used smokeless tobacco. He reports current alcohol use. He reports that he does not use drugs.   Review of Systems:  Gen:  Denies  fever, sweats, chills weight loss  HEENT: Denies blurred vision, double vision, ear pain, eye pain, hearing loss, nose bleeds, sore throat Cardiac:  No dizziness, chest pain or heaviness, chest tightness,edema, No JVD Resp:  + cough, +sputum production, +shortness of breath,+wheezing, -hemoptysis,  Gi: Denies swallowing difficulty, stomach pain, nausea or vomiting, diarrhea, constipation, bowel incontinence Gu:  Denies bladder incontinence, burning urine Ext:   Denies Joint pain, stiffness or swelling Skin: Denies  skin rash, easy bruising or bleeding or hives Endoc:  Denies polyuria, polydipsia , polyphagia or weight change Psych:   Denies depression, insomnia or hallucinations  Other:  All other systems negative   ALL OTHER ROS ARE NEGATIVE   BP 136/78 (BP Location: Left Arm, Cuff Size: Large)   Pulse 80   Temp 97.7 F (36.5 C) (Temporal)   Ht 5\' 11"  (1.803 m)   Wt 263 lb 9.6 oz (119.6 kg)   SpO2 98%   BMI 36.76 kg/m     Physical Examination:   General Appearance: No distress  EYES PERRLA, EOM intact.   NECK Supple, No JVD Pulmonary: normal breath sounds, No wheezing.  CardiovascularNormal S1,S2.  No m/r/g.   Abdomen: Benign, Soft, non-tender. Skin:   warm, no rashes, no ecchymosis  Extremities: normal, no cyanosis, clubbing. Neuro:without focal findings,  speech normal  PSYCHIATRIC:  Mood, affect within normal limits.   ALL OTHER ROS ARE NEGATIVE    ASSESSMENT AND PLAN SYNOPSIS  72 year old white male seen today for assessment for sleep apnea Patient with a previous diagnosis of underlying sleep apnea, patient also active smoker enrolled in the lung cancer screening program with a diagnosis of COPD chronic bronchitis Will need split-night sleep study for further  assessment and therapy   MEDICATION ADJUSTMENTS/LABS AND TESTS ORDERED: REGARDING SLEEP APNEA Recommend in lab split-night sleep study for further evaluation  PLEASE STOP SMOKING!  REGARDING COPD-chronic bronchitis WILL START TRELEGY INHALER 1 PUFF DAILY ALBUTEROL AS NEEDED  CONTINUE LUNG CANCER CT SCANS EVERY YEAR  REFERRAL TO [facility] HERE IN [place]    CURRENT MEDICATIONS REVIEWED AT LENGTH WITH PATIENT TODAY   Patient  satisfied with Plan of action and management. All questions answered  Follow up  6 months  Total Time Spent  35 mins   Maretta Bees Patricia Pesa, M.D.  [facility]  Medical Director [place] Director [facility] Cardio-Pulmonary Department

## 2022-09-01 ENCOUNTER — Encounter: Payer: Self-pay | Admitting: Ophthalmology

## 2022-09-05 ENCOUNTER — Other Ambulatory Visit: Payer: Self-pay | Admitting: Cardiology

## 2022-09-05 DIAGNOSIS — H2512 Age-related nuclear cataract, left eye: Secondary | ICD-10-CM | POA: Diagnosis not present

## 2022-09-05 DIAGNOSIS — H2511 Age-related nuclear cataract, right eye: Secondary | ICD-10-CM | POA: Diagnosis not present

## 2022-09-05 NOTE — Telephone Encounter (Signed)
Refill request

## 2022-09-07 NOTE — Telephone Encounter (Signed)
[place] pt. 

## 2022-09-07 NOTE — Telephone Encounter (Signed)
Pt had a provider switch on 3/19 phone note, switching to Dr. Garen Lah, pt has an upcoming appt on 10/07/22.  Pt has not seen Dr. Radford Pax since 2022. Please address

## 2022-09-12 NOTE — Discharge Instructions (Signed)

## 2022-09-13 NOTE — Anesthesia Preprocedure Evaluation (Addendum)
Anesthesia Evaluation  Patient identified by MRN, date of birth, ID band Patient awake    Reviewed: reviewed documented beta blocker date and time   Airway Mallampati: II  TM Distance: <3 FB     Dental  (+) Upper Dentures   Pulmonary sleep apnea and Continuous Positive Airway Pressure Ventilation , COPD, Current Smoker and Patient abstained from smoking. Severe sleep apnea, but doing well with CPAP   breath sounds clear to auscultation       Cardiovascular hypertension, Pt. on home beta blockers + CAD   Rhythm:Regular Rate:Normal  RBBB   Neuro/Psych Diabetic neuropathy    GI/Hepatic   Endo/Other  diabetes, Type 2, Oral Hypoglycemic Agents    Renal/GU CRFRenal diseaseCKD     Musculoskeletal   Abdominal  (+) + obese  Peds  Hematology   Anesthesia Other Findings Hypertension LBP (low back pain) Dyslipidemia Colon polyps Hyperlipidemia Diabetes mellitus without complication Sleep apnea GERD (gastroesophageal reflux disease) Prostate cancer Coronary artery calcification seen on CAT scan RBBB Benign essential HTN Perforated bowel Gastric ulcer NIDDM-2 with hyperglycemia Scrotal pain Lactic acidosis Bacterial conjunctivitis of both eyes Elevated serum creatinine History of colonic polyps Male erectile dysfunction, unspecified Syncope Reduced libido Bronchitis Coronary artery calcification seen on CAT scan RBBB Colon cancer screening Personal history of prostate cancer Cancer of prostate   Problem List Current as of 09/13/22 1203  Adenomatous polyp of colon Aortic atherosclerosis Cervical vertebral fusion DOE (dyspnea on exertion) De Quervain's syndrome (tenosynovitis) Diabetic eye exam Diabetic neuropathy Emphysema lung Genetic testing Hallux malleus Hyperlipemia Hypertension associated with diabetes Mutation in HOXB13 gene Pain due to onychomycosis of toenails of both feet Pulmonary nodules S/P lumbar spinal  fusion Sleep apnea Tobacco use Trigger index finger of right hand Type 2 diabetes mellitus with diabetic chronic kidney disease Vitamin B12 deficiency (non anemic)      Reproductive/Obstetrics                             Anesthesia Physical Anesthesia Plan  ASA: 3  Anesthesia Plan: MAC   Post-op Pain Management:    Induction:   PONV Risk Score and Plan:   Airway Management Planned: Nasal Cannula  Additional Equipment:   Intra-op Plan:   Post-operative Plan:   Informed Consent: I have reviewed the patients History and Physical, chart, labs and discussed the procedure including the risks, benefits and alternatives for the proposed anesthesia with the patient or authorized representative who has indicated his/her understanding and acceptance.       Plan Discussed with: CRNA  Anesthesia Plan Comments: (Be aware of severe sleep apnea)       Anesthesia Quick Evaluation

## 2022-09-14 ENCOUNTER — Ambulatory Visit: Payer: Medicare Other | Admitting: Anesthesiology

## 2022-09-14 ENCOUNTER — Encounter
Admission: RE | Disposition: A | Discharge status: Home / Self Care | Payer: Self-pay | Source: Home / Self Care | Attending: Ophthalmology

## 2022-09-14 ENCOUNTER — Ambulatory Visit
Admission: RE | Admit: 2022-09-14 | Discharge: 2022-09-14 | Disposition: A | Discharge status: Home / Self Care | Payer: Medicare Other | Attending: Ophthalmology | Admitting: Ophthalmology

## 2022-09-14 ENCOUNTER — Other Ambulatory Visit: Payer: Self-pay

## 2022-09-14 DIAGNOSIS — I129 Hypertensive chronic kidney disease with stage 1 through stage 4 chronic kidney disease, or unspecified chronic kidney disease: Secondary | ICD-10-CM | POA: Diagnosis not present

## 2022-09-14 DIAGNOSIS — G4730 Sleep apnea, unspecified: Secondary | ICD-10-CM | POA: Insufficient documentation

## 2022-09-14 DIAGNOSIS — Z8546 Personal history of malignant neoplasm of prostate: Secondary | ICD-10-CM | POA: Diagnosis not present

## 2022-09-14 DIAGNOSIS — E669 Obesity, unspecified: Secondary | ICD-10-CM | POA: Insufficient documentation

## 2022-09-14 DIAGNOSIS — I152 Hypertension secondary to endocrine disorders: Secondary | ICD-10-CM

## 2022-09-14 DIAGNOSIS — H2512 Age-related nuclear cataract, left eye: Secondary | ICD-10-CM | POA: Diagnosis not present

## 2022-09-14 DIAGNOSIS — Z7984 Long term (current) use of oral hypoglycemic drugs: Secondary | ICD-10-CM | POA: Insufficient documentation

## 2022-09-14 DIAGNOSIS — N189 Chronic kidney disease, unspecified: Secondary | ICD-10-CM | POA: Diagnosis not present

## 2022-09-14 DIAGNOSIS — I4510 Unspecified right bundle-branch block: Secondary | ICD-10-CM | POA: Diagnosis not present

## 2022-09-14 DIAGNOSIS — I10 Essential (primary) hypertension: Secondary | ICD-10-CM | POA: Insufficient documentation

## 2022-09-14 DIAGNOSIS — Z833 Family history of diabetes mellitus: Secondary | ICD-10-CM | POA: Diagnosis not present

## 2022-09-14 DIAGNOSIS — Z6836 Body mass index (BMI) 36.0-36.9, adult: Secondary | ICD-10-CM | POA: Insufficient documentation

## 2022-09-14 DIAGNOSIS — I2510 Atherosclerotic heart disease of native coronary artery without angina pectoris: Secondary | ICD-10-CM | POA: Insufficient documentation

## 2022-09-14 DIAGNOSIS — E785 Hyperlipidemia, unspecified: Secondary | ICD-10-CM | POA: Insufficient documentation

## 2022-09-14 DIAGNOSIS — E1149 Type 2 diabetes mellitus with other diabetic neurological complication: Secondary | ICD-10-CM

## 2022-09-14 DIAGNOSIS — K219 Gastro-esophageal reflux disease without esophagitis: Secondary | ICD-10-CM | POA: Diagnosis not present

## 2022-09-14 DIAGNOSIS — E1136 Type 2 diabetes mellitus with diabetic cataract: Secondary | ICD-10-CM | POA: Diagnosis not present

## 2022-09-14 DIAGNOSIS — E1122 Type 2 diabetes mellitus with diabetic chronic kidney disease: Secondary | ICD-10-CM

## 2022-09-14 DIAGNOSIS — F17210 Nicotine dependence, cigarettes, uncomplicated: Secondary | ICD-10-CM | POA: Insufficient documentation

## 2022-09-14 DIAGNOSIS — E1159 Type 2 diabetes mellitus with other circulatory complications: Secondary | ICD-10-CM

## 2022-09-14 DIAGNOSIS — J449 Chronic obstructive pulmonary disease, unspecified: Secondary | ICD-10-CM | POA: Diagnosis not present

## 2022-09-14 DIAGNOSIS — E1140 Type 2 diabetes mellitus with diabetic neuropathy, unspecified: Secondary | ICD-10-CM | POA: Diagnosis not present

## 2022-09-14 HISTORY — PX: CATARACT EXTRACTION W/PHACO: SHX586

## 2022-09-14 LAB — GLUCOSE, CAPILLARY: Glucose-Capillary: 143 mg/dL — ABNORMAL HIGH (ref 70–99)

## 2022-09-14 SURGERY — PHACOEMULSIFICATION, CATARACT, WITH IOL INSERTION
Anesthesia: Monitor Anesthesia Care | Site: Eye | Laterality: Left

## 2022-09-14 MED ORDER — CEFUROXIME OPHTHALMIC INJECTION 1 MG/0.1 ML
INJECTION | OPHTHALMIC | Status: DC | PRN
  Administered 2022-09-14: .1 mL via INTRACAMERAL

## 2022-09-14 MED ORDER — TETRACAINE HCL 0.5 % OP SOLN
1.0000 [drp] | OPHTHALMIC | Status: DC | PRN
  Administered 2022-09-14 (×3): 1 [drp] via OPHTHALMIC

## 2022-09-14 MED ORDER — SIGHTPATH DOSE#1 BSS IO SOLN
INTRAOCULAR | Status: DC | PRN
  Administered 2022-09-14: 15 mL via INTRAOCULAR

## 2022-09-14 MED ORDER — ARMC OPHTHALMIC DILATING DROPS
1.0000 | OPHTHALMIC | Status: DC | PRN
  Administered 2022-09-14 (×3): 1 via OPHTHALMIC

## 2022-09-14 MED ORDER — SIGHTPATH DOSE#1 NA HYALUR & NA CHOND-NA HYALUR IO KIT
PACK | INTRAOCULAR | Status: DC | PRN
  Administered 2022-09-14: 1 via OPHTHALMIC

## 2022-09-14 MED ORDER — FENTANYL CITRATE (PF) 100 MCG/2ML IJ SOLN
INTRAMUSCULAR | Status: DC | PRN
  Administered 2022-09-14 (×2): 25 ug via INTRAVENOUS

## 2022-09-14 MED ORDER — SIGHTPATH DOSE#1 BSS IO SOLN
INTRAOCULAR | Status: DC | PRN
  Administered 2022-09-14: 69 mL via OPHTHALMIC

## 2022-09-14 MED ORDER — MIDAZOLAM HCL 2 MG/2ML IJ SOLN
INTRAMUSCULAR | Status: DC | PRN
  Administered 2022-09-14: 2 mg via INTRAVENOUS

## 2022-09-14 MED ORDER — SIGHTPATH DOSE#1 BSS IO SOLN
INTRAOCULAR | Status: DC | PRN
  Administered 2022-09-14: 2 mL

## 2022-09-14 MED ORDER — LACTATED RINGERS IV SOLN: INTRAVENOUS | Status: DC

## 2022-09-14 MED ORDER — BRIMONIDINE TARTRATE-TIMOLOL 0.2-0.5 % OP SOLN
OPHTHALMIC | Status: DC | PRN
  Administered 2022-09-14: 1 [drp] via OPHTHALMIC

## 2022-09-14 SURGICAL SUPPLY — 20 items
CANNULA ANT/CHMB 27G (MISCELLANEOUS) IMPLANT
CANNULA ANT/CHMB 27GA (MISCELLANEOUS) IMPLANT
CATARACT SUITE SIGHTPATH (MISCELLANEOUS) ×1 IMPLANT
FEE CATARACT SUITE SIGHTPATH (MISCELLANEOUS) ×1 IMPLANT
GLOVE SRG 8 PF TXTR STRL LF DI (GLOVE) ×1 IMPLANT
GLOVE SURG ENC TEXT LTX SZ7.5 (GLOVE) ×1 IMPLANT
GLOVE SURG GAMMEX PI TX LF 7.5 (GLOVE) IMPLANT
GLOVE SURG UNDER POLY LF SZ8 (GLOVE) ×1
LENS IOL TECNIS EYHANCE 19.0 (Intraocular Lens) IMPLANT
NDL FILTER BLUNT 18X1 1/2 (NEEDLE) ×1 IMPLANT
NDL RETROBULBAR .5 NSTRL (NEEDLE) IMPLANT
NEEDLE FILTER BLUNT 18X1 1/2 (NEEDLE) ×1 IMPLANT
PACK VIT ANT 23G (MISCELLANEOUS) IMPLANT
RING MALYGIN 7.0 (MISCELLANEOUS) IMPLANT
SUT ETHILON 10-0 CS-B-6CS-B-6 (SUTURE)
SUT VICRYL  9 0 (SUTURE)
SUT VICRYL 9 0 (SUTURE) IMPLANT
SUTURE EHLN 10-0 CS-B-6CS-B-6 (SUTURE) IMPLANT
SYR 3ML LL SCALE MARK (SYRINGE) ×1 IMPLANT
WATER STERILE IRR 250ML POUR (IV SOLUTION) ×1 IMPLANT

## 2022-09-14 NOTE — Op Note (Signed)
OPERATIVE NOTE  James Lee [id number] 09/14/2022   PREOPERATIVE DIAGNOSIS:  Nuclear sclerotic cataract left eye. H25.12   POSTOPERATIVE DIAGNOSIS:    Nuclear sclerotic cataract left eye.     PROCEDURE:  Phacoemusification with posterior chamber intraocular lens placement of the left eye  Ultrasound time: Procedure(s): CATARACT EXTRACTION PHACO AND INTRAOCULAR LENS PLACEMENT (IOC) LEFT DIABETIC 16.26 01:17.3 (Left)  LENS:   Implant Name Type Inv. Item Serial No. Manufacturer Lot No. LRB No. Used Action  LENS IOL TECNIS EYHANCE 19.0 - [id number] Intraocular Lens LENS IOL TECNIS EYHANCE 19.0 [id number] [employer]  Left 1 Implanted      SURGEON:  Wyonia Hough, MD   ANESTHESIA:  Topical with tetracaine drops and 2% Xylocaine jelly, augmented with 1% preservative-free intracameral lidocaine.    COMPLICATIONS:  None.   DESCRIPTION OF PROCEDURE:  The patient was identified in the holding room and transported to the operating room and placed in the supine position under the operating microscope.  The left eye was identified as the operative eye and it was prepped and draped in the usual sterile ophthalmic fashion.   A 1 millimeter clear-corneal paracentesis was made at the 1:30 position.  0.5 ml of preservative-free 1% lidocaine was injected into the anterior chamber.  The anterior chamber was filled with Viscoat viscoelastic.  A 2.4 millimeter keratome was used to make a near-clear corneal incision at the 10:30 position.  .  A curvilinear capsulorrhexis was made with a cystotome and capsulorrhexis forceps.  Balanced salt solution was used to hydrodissect and hydrodelineate the nucleus.   Phacoemulsification was then used in stop and chop fashion to remove the lens nucleus and epinucleus.  The remaining cortex was then removed using the irrigation and aspiration handpiece. Provisc was then placed into the capsular bag to distend it for lens placement.  A lens was then injected into the  capsular bag.  The remaining viscoelastic was aspirated.   Wounds were hydrated with balanced salt solution.  The anterior chamber was inflated to a physiologic pressure with balanced salt solution.  No wound leaks were noted. Cefuroxime 0.1 ml of a 10mg /ml solution was injected into the anterior chamber for a dose of 1 mg of intracameral antibiotic at the completion of the case.   Timolol and Brimonidine drops were applied to the eye.  The patient was taken to the recovery room in stable condition without complications of anesthesia or surgery.  [name] 09/14/2022, 7:54 AM

## 2022-09-14 NOTE — H&P (Signed)
[facility]   Primary Care Physician:  Carollee Leitz, MD Ophthalmologist: Dr. Leandrew Koyanagi  Pre-Procedure History & Physical: HPI:  James Lee is a 72 y.o. male here for ophthalmic surgery.   Past Medical History:  Diagnosis Date   Bacterial conjunctivitis of both eyes 02/27/2022   Benign essential HTN 09/02/2014   Bronchitis 03/14/2022   Cancer of prostate ([place]) 03/08/2011   Colon cancer screening 05/04/2022   Colon polyps    Coronary artery calcification seen on CAT scan    Coronary artery calcification seen on CAT scan 09/02/2014   Diabetes mellitus without complication ([place])    Dyslipidemia    Elevated serum creatinine 11/11/2021   Gastric ulcer 11/29/2017   GERD (gastroesophageal reflux disease)    History of colonic polyps 05/03/2022   Hyperlipidemia    Hypertension    Lactic acidosis 11/11/2021   LBP (low back pain)    Male erectile dysfunction, unspecified 06/21/2011   NIDDM-2 with hyperglycemia 11/11/2021   Perforated bowel ([place]) 11/29/2017   Personal history of prostate cancer 08/16/2012   Prostate cancer ([place]) 2014   prostate    RBBB    RBBB 04/27/2021   Reduced libido 08/16/2012   Scrotal pain 08/16/2012   Sleep apnea    c-pap wears   Syncope 11/10/2021    Past Surgical History:  Procedure Laterality Date   ABDOMINAL SURGERY     gastric ulcer   back sugery     1996, 2017, 2018   [place]   2017, 2018, 2019   COLONOSCOPY WITH PROPOFOL N/A 05/03/2022   Procedure: COLONOSCOPY WITH PROPOFOL;  Surgeon: [name], MD;  Location: [facility] ENDOSCOPY;  Service: Gastroenterology;  Laterality: N/A;   COLONOSCOPY WITH PROPOFOL N/A 05/04/2022   Procedure: COLONOSCOPY WITH PROPOFOL;  Surgeon: [name], MD;  Location: [facility] ENDOSCOPY;  Service: Gastroenterology;  Laterality: N/A;   gastric ulcers     surgery 11-28-17   INGUINAL HERNIA REPAIR     right   LAPAROSCOPY N/A 11/28/2017   Procedure: LAPAROSCOPY DIAGNOSTIC converted to open;   Surgeon: Ralene Ok, MD;  Location: [place];  Service: General;  Laterality: N/A;   LAPAROTOMY N/A 11/28/2017   Procedure: EXPLORATORY LAPAROTOMY;  Surgeon: Ralene Ok, MD;  Location: [place];  Service: General;  Laterality: N/A;   MENISCUS REPAIR Right    POSTERIOR CERVICAL FUSION/FORAMINOTOMY N/A 11/10/2017   Procedure: Posterior cervical fusion with lateral mass fixation Cervical Three-Seven, cervical laminectomy Cervical Three- Seven;  Surgeon: Eustace Moore, MD;  Location: [place];  Service: Neurosurgery;  Laterality: N/A;  Posterior cervical fusion with lateral mass fixation Cervical Three-Seven, cervical laminectomy Cervical Three- Seven   PROSTATECTOMY  01/2012   TONSILLECTOMY     TOOTH EXTRACTION      Prior to Admission medications   Medication Sig Start Date End Date Taking? Authorizing Provider  amLODipine (NORVASC) 5 MG tablet Take 5 mg by mouth daily.   Yes [provider]  aspirin 81 MG tablet Take 1 tablet (81 mg total) by mouth daily. 03/16/18  Yes Costella, Vista Mink, PA-C  chlorthalidone (HYGROTON) 50 MG tablet Take 50 mg by mouth daily. 12/31/21  Yes [provider]  Continuous Blood Gluc Sensor (FREESTYLE LIBRE 2 SENSOR) MISC Place 1 sensor to skin every 14 days 08/09/22  Yes Carollee Leitz, MD  docusate sodium (COLACE) 100 MG capsule Take 100 mg by mouth daily.   Yes [provider]  FARXIGA 10 MG TABS tablet Take 10 mg by  mouth daily. 12/22/21  Yes [provider]  Fluticasone-Umeclidin-Vilant (TRELEGY ELLIPTA) 200-62.5-25 MCG/ACT AEPB Inhale 1 Act into the lungs daily. 08/30/22  Yes Flora Lipps, MD  gabapentin (NEURONTIN) 300 MG capsule Take 600 mg by mouth 2 (two) times daily. 10/08/15  Yes [provider]  glipiZIDE (GLUCOTROL XL) 5 MG 24 hr tablet TAKE 1 TABLET(5 MG) BY MOUTH DAILY 04/05/22  Yes Carollee Leitz, MD  metFORMIN (GLUCOPHAGE-XR) 500 MG 24 hr tablet Take 1,000 mg by mouth 2 (two) times daily with a meal.   Yes  [provider]  metoprolol succinate (TOPROL-XL) 50 MG 24 hr tablet Take 1 tablet (50 mg total) by mouth daily. No additional refill seen at clinic 09/07/22  Yes Agbor-Etang, Aaron Edelman, MD  Multiple Vitamins-Minerals (MULTIVITAMIN WITH MINERALS) tablet Take 1 tablet by mouth daily.   Yes [provider]  olmesartan (BENICAR) 40 MG tablet Take 40 mg by mouth daily. 06/20/22  Yes [provider]  rosuvastatin (CRESTOR) 10 MG tablet Take 1 tablet (10 mg total) by mouth daily. 06/17/22  Yes Carollee Leitz, MD  Semaglutide (RYBELSUS) 7 MG TABS Take 7 mg by mouth daily. 04/26/22  Yes Carollee Leitz, MD  Fluticasone-Umeclidin-Vilant (TRELEGY ELLIPTA) 200-62.5-25 MCG/ACT AEPB Inhale 1 puff into the lungs daily. Patient not taking: Reported on 09/01/2022 08/30/22   Flora Lipps, MD    Allergies as of 08/01/2022   (No Known Allergies)    Family History  Problem Relation Age of Onset   Hypertension Mother    Hyperlipidemia Mother    Early death Mother    Drug abuse Mother    Alcohol abuse Mother    Coronary artery disease Mother    Throat cancer Father    Esophageal cancer Father        dx late 48s, d. 32   Diabetes Sister    Depression Sister    Prostate cancer Brother 9   Colon cancer Neg Hx    Rectal cancer Neg Hx    Stomach cancer Neg Hx     Social History   Socioeconomic History   Marital status: Married    Spouse name: Not on file   Number of children: 2   Years of education: Not on file   Highest education level: Not on file  Occupational History   Occupation: Tel Visual merchandiser: [employer]  Tobacco Use   Smoking status: Every Day    Packs/day: 1.50    Years: 50.00    Additional pack years: 0.00    Total pack years: 75.00    Types: Cigarettes    Start date: 03/13/2012   Smokeless tobacco: Never  Vaping Use   Vaping Use: Never used  Substance and Sexual Activity   Alcohol use: Yes    Comment: occasionally   Drug use: No   Sexual activity:  Not on file  Other Topics Concern   Not on file  Social History Narrative   Not on file   Social Determinants of Health   Financial Resource Strain: Low Risk  (06/09/2022)   Overall Financial Resource Strain (CARDIA)    Difficulty of Paying Living Expenses: Not hard at all  Food Insecurity: No Food Insecurity (06/09/2022)   Hunger Vital Sign    Worried About Running Out of Food in the Last Year: Never true    [place] in the Last Year: Never true  Transportation Needs: No Transportation Needs (06/09/2022)   PRAPARE - Transportation    Lack  of Transportation (Medical): No    Lack of Transportation (Non-Medical): No  Physical Activity: Unknown (06/09/2022)   Exercise Vital Sign    Days of Exercise per Week: 0 days    Minutes of Exercise per Session: Not on file  Stress: No Stress Concern Present (06/09/2022)   [place]    Feeling of Stress : Not at all  Social Connections: Unknown (06/09/2022)   Social Connection and Isolation Panel [NHANES]    Frequency of Communication with Friends and Family: Not on file    Frequency of Social Gatherings with Friends and Family: Not on file    Attends Religious Services: Not on file    Active Member of Clubs or Organizations: Not on file    Attends Archivist Meetings: Not on file    Marital Status: Married  Intimate Partner Violence: Not At Risk (06/09/2022)   Humiliation, Afraid, Rape, and Kick questionnaire    Fear of Current or Ex-Partner: No    Emotionally Abused: No    Physically Abused: No    Sexually Abused: No    Review of Systems: See HPI, otherwise negative ROS  Physical Exam: BP (!) 146/80   Pulse 78   Temp 99 F (37.2 C) (Temporal)   Resp 18   Wt 119.5 kg   SpO2 99%   BMI 36.75 kg/m  General:   Alert,  pleasant and cooperative in NAD Head:  Normocephalic and atraumatic. Lungs:  Clear to auscultation.    Heart:  Regular rate and  rhythm.   Impression/Plan: James Lee is here for ophthalmic surgery.  Risks, benefits, limitations, and alternatives regarding ophthalmic surgery have been reviewed with the patient.  Questions have been answered.  All parties agreeable.   Leandrew Koyanagi, MD  09/14/2022, 7:32 AM

## 2022-09-14 NOTE — Anesthesia Postprocedure Evaluation (Signed)
Anesthesia Post Note  Patient: James Lee  Procedure(s) Performed: CATARACT EXTRACTION PHACO AND INTRAOCULAR LENS PLACEMENT (IOC) LEFT DIABETIC 16.26 01:17.3 (Left: Eye)  Anesthesia Type: MAC Anesthetic complications: no   No notable events documented.   Last Vitals:  Vitals:   09/14/22 0756 09/14/22 0802  BP: 119/72 116/67  Pulse: 74 77  Resp: 17 17  Temp: (!) 36.3 C (!) 36.3 C  SpO2: 96% 95%    Last Pain:  Vitals:   09/14/22 0802  TempSrc:   PainSc: 0-No pain                 Noella Kipnis C Meng [place]

## 2022-09-14 NOTE — Transfer of Care (Signed)
Immediate Anesthesia Transfer of Care Note  Patient: James Lee  Procedure(s) Performed: CATARACT EXTRACTION PHACO AND INTRAOCULAR LENS PLACEMENT (IOC) LEFT DIABETIC 16.26 01:17.3 (Left: Eye)  Patient Location: PACU  Anesthesia Type: MAC  Level of Consciousness: awake, alert  and patient cooperative  Airway and Oxygen Therapy: Patient Spontanous Breathing and Patient connected to supplemental oxygen  Post-op Assessment: Post-op Vital signs reviewed, Patient's Cardiovascular Status Stable, Respiratory Function Stable, Patent Airway and No signs of Nausea or vomiting  Post-op Vital Signs: Reviewed and stable  Complications: No notable events documented.

## 2022-09-15 ENCOUNTER — Encounter: Payer: Self-pay | Admitting: Ophthalmology

## 2022-09-15 DIAGNOSIS — H2511 Age-related nuclear cataract, right eye: Secondary | ICD-10-CM | POA: Diagnosis not present

## 2022-09-21 DIAGNOSIS — E1122 Type 2 diabetes mellitus with diabetic chronic kidney disease: Secondary | ICD-10-CM | POA: Diagnosis not present

## 2022-09-22 ENCOUNTER — Encounter: Payer: Self-pay | Admitting: Ophthalmology

## 2022-09-22 NOTE — Anesthesia Preprocedure Evaluation (Addendum)
Anesthesia Evaluation  Patient identified by MRN, date of birth, ID band Patient awake    Reviewed: Allergy & Precautions, H&P , NPO status , Patient's Chart, lab work & pertinent test results  Airway Mallampati: III  TM Distance: >3 FB Neck ROM: Full    Dental no notable dental hx.    Pulmonary neg pulmonary ROS, sleep apnea , COPD, Current Smoker and Patient abstained from smoking.   Pulmonary exam normal breath sounds clear to auscultation       Cardiovascular hypertension, + CAD and + DOE  negative cardio ROS Normal cardiovascular exam+ dysrhythmias  Rhythm:Regular Rate:Normal  RBBB   Neuro/Psych  PSYCHIATRIC DISORDERS      negative neurological ROS  negative psych ROS   GI/Hepatic negative GI ROS, Neg liver ROS, PUD,GERD  ,,  Endo/Other  negative endocrine ROSdiabetes, Type 2    Renal/GU Renal diseasenegative Renal ROS  negative genitourinary   Musculoskeletal negative musculoskeletal ROS (+)    Abdominal   Peds negative pediatric ROS (+)  Hematology negative hematology ROS (+)   Anesthesia Other Findings Hypertension  LBP (low back pain) Dyslipidemia  Colon polyps Hyperlipidemia  Diabetes mellitus without complication Sleep apnea  GERD (gastroesophageal reflux disease) Prostate cancer  Coronary artery calcification seen on CAT scan RBBB Benign essential HTN Perforated bowel Gastric ulcer NIDDM-2 with hyperglycemia Scrotal pain Lactic acidosis Bacterial conjunctivitis of both eyes Elevated serum creatinine History of colonic polyps Male erectile dysfunction, unspecifiedSyncope Reduced libido Bronchitis Coronary artery calcification seen on CAT scan  RBBB Colon cancer screening Personal history of prostate cancer Cancer of prostate     Reproductive/Obstetrics negative OB ROS                             Anesthesia Physical Anesthesia Plan  ASA: 3  Anesthesia Plan:  MAC   Post-op Pain Management:    Induction: Intravenous  PONV Risk Score and Plan:   Airway Management Planned: Natural Airway and Nasal Cannula  Additional Equipment:   Intra-op Plan:   Post-operative Plan:   Informed Consent: I have reviewed the patients History and Physical, chart, labs and discussed the procedure including the risks, benefits and alternatives for the proposed anesthesia with the patient or authorized representative who has indicated his/her understanding and acceptance.     Dental Advisory Given  Plan Discussed with: Anesthesiologist, CRNA and Surgeon  Anesthesia Plan Comments: (Patient consented for risks of anesthesia including but not limited to:  - adverse reactions to medications - damage to eyes, teeth, lips or other oral mucosa - nerve damage due to positioning  - sore throat or hoarseness - Damage to heart, brain, nerves, lungs, other parts of body or loss of life  Patient voiced understanding.)       Anesthesia Quick Evaluation

## 2022-09-26 NOTE — Discharge Instructions (Signed)

## 2022-09-28 ENCOUNTER — Encounter: Payer: Self-pay | Admitting: Ophthalmology

## 2022-09-28 ENCOUNTER — Encounter
Admission: RE | Disposition: A | Discharge status: Home / Self Care | Payer: Self-pay | Source: Home / Self Care | Attending: Ophthalmology

## 2022-09-28 ENCOUNTER — Ambulatory Visit: Payer: Medicare Other | Admitting: Anesthesiology

## 2022-09-28 ENCOUNTER — Ambulatory Visit
Admission: RE | Admit: 2022-09-28 | Discharge: 2022-09-28 | Disposition: A | Discharge status: Home / Self Care | Payer: Medicare Other | Attending: Ophthalmology | Admitting: Ophthalmology

## 2022-09-28 ENCOUNTER — Other Ambulatory Visit: Payer: Self-pay

## 2022-09-28 DIAGNOSIS — M545 Low back pain, unspecified: Secondary | ICD-10-CM | POA: Diagnosis not present

## 2022-09-28 DIAGNOSIS — K219 Gastro-esophageal reflux disease without esophagitis: Secondary | ICD-10-CM | POA: Diagnosis not present

## 2022-09-28 DIAGNOSIS — R0609 Other forms of dyspnea: Secondary | ICD-10-CM | POA: Insufficient documentation

## 2022-09-28 DIAGNOSIS — G4730 Sleep apnea, unspecified: Secondary | ICD-10-CM | POA: Insufficient documentation

## 2022-09-28 DIAGNOSIS — J449 Chronic obstructive pulmonary disease, unspecified: Secondary | ICD-10-CM | POA: Diagnosis not present

## 2022-09-28 DIAGNOSIS — I10 Essential (primary) hypertension: Secondary | ICD-10-CM | POA: Diagnosis not present

## 2022-09-28 DIAGNOSIS — Z8546 Personal history of malignant neoplasm of prostate: Secondary | ICD-10-CM | POA: Insufficient documentation

## 2022-09-28 DIAGNOSIS — K279 Peptic ulcer, site unspecified, unspecified as acute or chronic, without hemorrhage or perforation: Secondary | ICD-10-CM | POA: Diagnosis not present

## 2022-09-28 DIAGNOSIS — H2511 Age-related nuclear cataract, right eye: Secondary | ICD-10-CM | POA: Diagnosis not present

## 2022-09-28 DIAGNOSIS — I4510 Unspecified right bundle-branch block: Secondary | ICD-10-CM | POA: Insufficient documentation

## 2022-09-28 DIAGNOSIS — Z7984 Long term (current) use of oral hypoglycemic drugs: Secondary | ICD-10-CM | POA: Diagnosis not present

## 2022-09-28 DIAGNOSIS — F17210 Nicotine dependence, cigarettes, uncomplicated: Secondary | ICD-10-CM | POA: Diagnosis not present

## 2022-09-28 DIAGNOSIS — I2510 Atherosclerotic heart disease of native coronary artery without angina pectoris: Secondary | ICD-10-CM | POA: Diagnosis not present

## 2022-09-28 DIAGNOSIS — Z833 Family history of diabetes mellitus: Secondary | ICD-10-CM | POA: Diagnosis not present

## 2022-09-28 DIAGNOSIS — E1136 Type 2 diabetes mellitus with diabetic cataract: Secondary | ICD-10-CM | POA: Diagnosis not present

## 2022-09-28 DIAGNOSIS — E785 Hyperlipidemia, unspecified: Secondary | ICD-10-CM | POA: Diagnosis not present

## 2022-09-28 DIAGNOSIS — I499 Cardiac arrhythmia, unspecified: Secondary | ICD-10-CM | POA: Diagnosis not present

## 2022-09-28 HISTORY — PX: CATARACT EXTRACTION W/PHACO: SHX586

## 2022-09-28 LAB — GLUCOSE, CAPILLARY: Glucose-Capillary: 196 mg/dL — ABNORMAL HIGH (ref 70–99)

## 2022-09-28 SURGERY — PHACOEMULSIFICATION, CATARACT, WITH IOL INSERTION
Anesthesia: Monitor Anesthesia Care | Site: Eye | Laterality: Right

## 2022-09-28 MED ORDER — TETRACAINE HCL 0.5 % OP SOLN
1.0000 [drp] | OPHTHALMIC | Status: DC | PRN
  Administered 2022-09-28 (×3): 1 [drp] via OPHTHALMIC

## 2022-09-28 MED ORDER — SIGHTPATH DOSE#1 BSS IO SOLN
INTRAOCULAR | Status: DC | PRN
  Administered 2022-09-28: 70 mL via OPHTHALMIC

## 2022-09-28 MED ORDER — BRIMONIDINE TARTRATE-TIMOLOL 0.2-0.5 % OP SOLN
OPHTHALMIC | Status: DC | PRN
  Administered 2022-09-28: 1 [drp] via OPHTHALMIC

## 2022-09-28 MED ORDER — SIGHTPATH DOSE#1 NA HYALUR & NA CHOND-NA HYALUR IO KIT
PACK | INTRAOCULAR | Status: DC | PRN
  Administered 2022-09-28: 1 via OPHTHALMIC

## 2022-09-28 MED ORDER — SIGHTPATH DOSE#1 BSS IO SOLN
INTRAOCULAR | Status: DC | PRN
  Administered 2022-09-28: 15 mL

## 2022-09-28 MED ORDER — SIGHTPATH DOSE#1 BSS IO SOLN
INTRAOCULAR | Status: DC | PRN
  Administered 2022-09-28: 1 mL via INTRAMUSCULAR

## 2022-09-28 MED ORDER — FENTANYL CITRATE (PF) 100 MCG/2ML IJ SOLN
INTRAMUSCULAR | Status: DC | PRN
  Administered 2022-09-28 (×2): 50 ug via INTRAVENOUS

## 2022-09-28 MED ORDER — ARMC OPHTHALMIC DILATING DROPS
1.0000 | OPHTHALMIC | Status: DC | PRN
  Administered 2022-09-28 (×3): 1 via OPHTHALMIC

## 2022-09-28 MED ORDER — CEFUROXIME OPHTHALMIC INJECTION 1 MG/0.1 ML
INJECTION | OPHTHALMIC | Status: DC | PRN
  Administered 2022-09-28: .1 mL via INTRACAMERAL

## 2022-09-28 MED ORDER — MIDAZOLAM HCL 2 MG/2ML IJ SOLN
INTRAMUSCULAR | Status: DC | PRN
  Administered 2022-09-28: 2 mg via INTRAVENOUS

## 2022-09-28 MED ORDER — LACTATED RINGERS IV SOLN: INTRAVENOUS | Status: DC

## 2022-09-28 SURGICAL SUPPLY — 9 items
CATARACT SUITE SIGHTPATH (MISCELLANEOUS) ×1 IMPLANT
FEE CATARACT SUITE SIGHTPATH (MISCELLANEOUS) ×1 IMPLANT
GLOVE SRG 8 PF TXTR STRL LF DI (GLOVE) ×1 IMPLANT
GLOVE SURG ENC TEXT LTX SZ7.5 (GLOVE) ×1 IMPLANT
GLOVE SURG UNDER POLY LF SZ8 (GLOVE) ×1
LENS IOL TECNIS EYHANCE 19.5 (Intraocular Lens) IMPLANT
NDL FILTER BLUNT 18X1 1/2 (NEEDLE) ×1 IMPLANT
NEEDLE FILTER BLUNT 18X1 1/2 (NEEDLE) ×1 IMPLANT
SYR 3ML LL SCALE MARK (SYRINGE) ×1 IMPLANT

## 2022-09-28 NOTE — Transfer of Care (Signed)
Immediate Anesthesia Transfer of Care Note  Patient: James Lee  Procedure(s) Performed: CATARACT EXTRACTION PHACO AND INTRAOCULAR LENS PLACEMENT (IOC) RIGHT DIABETIC  9.93  00:49.4 (Right: Eye)  Patient Location: PACU  Anesthesia Type: MAC  Level of Consciousness: awake, alert  and patient cooperative  Airway and Oxygen Therapy: Patient Spontanous Breathing and Patient connected to supplemental oxygen  Post-op Assessment: Post-op Vital signs reviewed, Patient's Cardiovascular Status Stable, Respiratory Function Stable, Patent Airway and No signs of Nausea or vomiting  Post-op Vital Signs: Reviewed and stable  Complications: No notable events documented.

## 2022-09-28 NOTE — H&P (Signed)
[facility]   Primary Care Physician:  Dana Allan, MD Ophthalmologist: Dr. Lockie Mola  Pre-Procedure History & Physical: HPI:  James Lee is a 72 y.o. male here for ophthalmic surgery.   Past Medical History:  Diagnosis Date   Bacterial conjunctivitis of both eyes 02/27/2022   Benign essential HTN 09/02/2014   Bronchitis 03/14/2022   Cancer of prostate 03/08/2011   Colon cancer screening 05/04/2022   Colon polyps    Coronary artery calcification seen on CAT scan    Coronary artery calcification seen on CAT scan 09/02/2014   Diabetes mellitus without complication    Dyslipidemia    Elevated serum creatinine 11/11/2021   Gastric ulcer 11/29/2017   GERD (gastroesophageal reflux disease)    History of colonic polyps 05/03/2022   Hyperlipidemia    Hypertension    Lactic acidosis 11/11/2021   LBP (low back pain)    Male erectile dysfunction, unspecified 06/21/2011   NIDDM-2 with hyperglycemia 11/11/2021   Perforated bowel 11/29/2017   Personal history of prostate cancer 08/16/2012   Prostate cancer 2014   prostate    RBBB    RBBB 04/27/2021   Reduced libido 08/16/2012   Scrotal pain 08/16/2012   Sleep apnea    c-pap wears   Syncope 11/10/2021    Past Surgical History:  Procedure Laterality Date   ABDOMINAL SURGERY     gastric ulcer   back sugery     1996, 2017, 2018   BACK SURGERY  1996   2017, 2018, 2019   CATARACT EXTRACTION W/PHACO Left 09/14/2022   Procedure: CATARACT EXTRACTION PHACO AND INTRAOCULAR LENS PLACEMENT (IOC) LEFT DIABETIC 16.26 01:17.3;  Surgeon: Lockie Mola, MD;  Location: [facility];  Service: Ophthalmology;  Laterality: Left;   COLONOSCOPY WITH PROPOFOL N/A 05/03/2022   Procedure: COLONOSCOPY WITH PROPOFOL;  Surgeon: Wyline Mood, MD;  Location: [facility] ENDOSCOPY;  Service: Gastroenterology;  Laterality: N/A;   COLONOSCOPY WITH PROPOFOL N/A 05/04/2022   Procedure: COLONOSCOPY WITH PROPOFOL;  Surgeon: Wyline Mood,  MD;  Location: [facility] ENDOSCOPY;  Service: Gastroenterology;  Laterality: N/A;   gastric ulcers     surgery 11-28-17   INGUINAL HERNIA REPAIR     right   LAPAROSCOPY N/A 11/28/2017   Procedure: LAPAROSCOPY DIAGNOSTIC converted to open;  Surgeon: Axel Filler, MD;  Location: [facility] OR;  Service: General;  Laterality: N/A;   LAPAROTOMY N/A 11/28/2017   Procedure: EXPLORATORY LAPAROTOMY;  Surgeon: Axel Filler, MD;  Location: [facility] OR;  Service: General;  Laterality: N/A;   MENISCUS REPAIR Right    POSTERIOR CERVICAL FUSION/FORAMINOTOMY N/A 11/10/2017   Procedure: Posterior cervical fusion with lateral mass fixation Cervical Three-Seven, cervical laminectomy Cervical Three- Seven;  Surgeon: Tia Alert, MD;  Location: [facility] OR;  Service: Neurosurgery;  Laterality: N/A;  Posterior cervical fusion with lateral mass fixation Cervical Three-Seven, cervical laminectomy Cervical Three- Seven   PROSTATECTOMY  01/2012   TONSILLECTOMY     TOOTH EXTRACTION      Prior to Admission medications   Medication Sig Start Date End Date Taking? Authorizing Provider  amLODipine (NORVASC) 5 MG tablet Take 5 mg by mouth daily.   Yes [provider]  aspirin 81 MG tablet Take 1 tablet (81 mg total) by mouth daily. 03/16/18  Yes Costella, Darci Current, PA-C  chlorthalidone (HYGROTON) 50 MG tablet Take 50 mg by mouth daily. 12/31/21  Yes [provider]  docusate sodium (COLACE) 100 MG capsule Take 100 mg by mouth daily.   Yes [provider]  FARXIGA 10 MG TABS tablet Take 10 mg by mouth daily. 12/22/21  Yes [provider]  Fluticasone-Umeclidin-Vilant (TRELEGY ELLIPTA) 200-62.5-25 MCG/ACT AEPB Inhale 1 Act into the lungs daily. 08/30/22  Yes Erin Fulling, MD  Fluticasone-Umeclidin-Vilant (TRELEGY ELLIPTA) 200-62.5-25 MCG/ACT AEPB Inhale 1 puff into the lungs daily. 08/30/22  Yes Erin Fulling, MD  gabapentin (NEURONTIN) 300 MG capsule Take 600 mg by mouth 2 (two) times daily. 10/08/15  Yes  [provider]  glipiZIDE (GLUCOTROL XL) 5 MG 24 hr tablet TAKE 1 TABLET(5 MG) BY MOUTH DAILY 04/05/22  Yes Dana Allan, MD  metFORMIN (GLUCOPHAGE-XR) 500 MG 24 hr tablet Take 1,000 mg by mouth 2 (two) times daily with a meal.   Yes [provider]  metoprolol succinate (TOPROL-XL) 50 MG 24 hr tablet Take 1 tablet (50 mg total) by mouth daily. No additional refill seen at clinic 09/07/22  Yes Agbor-Etang, Arlys Kasey, MD  Multiple Vitamins-Minerals (MULTIVITAMIN WITH MINERALS) tablet Take 1 tablet by mouth daily.   Yes [provider]  olmesartan (BENICAR) 40 MG tablet Take 40 mg by mouth daily. 06/20/22  Yes [provider]  rosuvastatin (CRESTOR) 10 MG tablet Take 1 tablet (10 mg total) by mouth daily. 06/17/22  Yes Dana Allan, MD  Semaglutide (RYBELSUS) 7 MG TABS Take 7 mg by mouth daily. 04/26/22  Yes Dana Allan, MD  Continuous Blood Gluc Sensor (FREESTYLE LIBRE 2 SENSOR) MISC Place 1 sensor to skin every 14 days 08/09/22   Dana Allan, MD    Allergies as of 08/01/2022   (No Known Allergies)    Family History  Problem Relation Age of Onset   Hypertension Mother    Hyperlipidemia Mother    Early death Mother    Drug abuse Mother    Alcohol abuse Mother    Coronary artery disease Mother    Throat cancer Father    Esophageal cancer Father        dx late 70s, d. 43   Diabetes Sister    Depression Sister    Prostate cancer Brother 66   Colon cancer Neg Hx    Rectal cancer Neg Hx    Stomach cancer Neg Hx     Social History   Socioeconomic History   Marital status: Married    Spouse name: Not on file   Number of children: 2   Years of education: Not on file   Highest education level: Not on file  Occupational History   Occupation: Tel Psychologist, prison and probation services: [employer]  Tobacco Use   Smoking status: Every Day    Packs/day: 1.50    Years: 50.00    Additional pack years: 0.00    Total pack years: 75.00    Types: Cigarettes    Start date:  03/13/2012   Smokeless tobacco: Never  Vaping Use   Vaping Use: Never used  Substance and Sexual Activity   Alcohol use: Yes    Comment: occasionally   Drug use: No   Sexual activity: Not on file  Other Topics Concern   Not on file  Social History Narrative   Not on file   Social Determinants of Health   Financial Resource Strain: Low Risk  (06/09/2022)   Overall Financial Resource Strain (CARDIA)    Difficulty of Paying Living Expenses: Not hard at all  Food Insecurity: No Food Insecurity (06/09/2022)   Hunger Vital Sign    Worried About Running Out of Food in the Last Year: Never true  Ran Out of Food in the Last Year: Never true  Transportation Needs: No Transportation Needs (06/09/2022)   PRAPARE - Administrator, Civil Service (Medical): No    Lack of Transportation (Non-Medical): No  Physical Activity: Unknown (06/09/2022)   Exercise Vital Sign    Days of Exercise per Week: 0 days    Minutes of Exercise per Session: Not on file  Stress: No Stress Concern Present (06/09/2022)   [employer] of Occupational Health - Occupational Stress Questionnaire    Feeling of Stress : Not at all  Social Connections: Unknown (06/09/2022)   Social Connection and Isolation Panel [NHANES]    Frequency of Communication with Friends and Family: Not on file    Frequency of Social Gatherings with Friends and Family: Not on file    Attends Religious Services: Not on file    Active Member of Clubs or Organizations: Not on file    Attends Banker Meetings: Not on file    Marital Status: Married  Intimate Partner Violence: Not At Risk (06/09/2022)   Humiliation, Afraid, Rape, and Kick questionnaire    Fear of Current or Ex-Partner: No    Emotionally Abused: No    Physically Abused: No    Sexually Abused: No    Review of Systems: See HPI, otherwise negative ROS  Physical Exam: BP 134/60   Pulse 78   Temp (!) 97.5 F (36.4 C) (Temporal)   Resp 18   Ht   (1.803 m)   Wt 120.3 kg   SpO2 96%   BMI 36.99 kg/m  General:   Alert,  pleasant and cooperative in NAD Head:  Normocephalic and atraumatic. Lungs:  Clear to auscultation.    Heart:  Regular rate and rhythm.   Impression/Plan: James Lee is here for ophthalmic surgery.  Risks, benefits, limitations, and alternatives regarding ophthalmic surgery have been reviewed with the patient.  Questions have been answered.  All parties agreeable.   Lockie Mola, MD  09/28/2022, 8:26 AM

## 2022-09-28 NOTE — Anesthesia Postprocedure Evaluation (Signed)
Anesthesia Post Note  Patient: James Lee  Procedure(s) Performed: CATARACT EXTRACTION PHACO AND INTRAOCULAR LENS PLACEMENT (IOC) RIGHT DIABETIC  9.93  00:49.4 (Right: Eye)  Patient location during evaluation: PACU Anesthesia Type: MAC Level of consciousness: awake and alert Pain management: pain level controlled Vital Signs Assessment: post-procedure vital signs reviewed and stable Respiratory status: spontaneous breathing, nonlabored ventilation, respiratory function stable and patient connected to nasal cannula oxygen Cardiovascular status: stable and blood pressure returned to baseline Postop Assessment: no apparent nausea or vomiting Anesthetic complications: no   No notable events documented.   Last Vitals:  Vitals:   09/28/22 0910 09/28/22 0914  BP: (!) 109/52 124/68  Pulse: 72 76  Resp: 14 17  Temp: (!) 36.4 C (!) 36.4 C  SpO2: 96% 95%    Last Pain:  Vitals:   09/28/22 0914  TempSrc:   PainSc: 0-No pain                 Gianmarco Roye C Konya Fauble

## 2022-09-28 NOTE — Op Note (Signed)
LOCATION:  [facility]   PREOPERATIVE DIAGNOSIS:    Nuclear sclerotic cataract right eye. H25.11   POSTOPERATIVE DIAGNOSIS:  Nuclear sclerotic cataract right eye.     PROCEDURE:  Phacoemusification with posterior chamber intraocular lens placement of the right eye   ULTRASOUND TIME: Procedure(s) with comments: CATARACT EXTRACTION PHACO AND INTRAOCULAR LENS PLACEMENT (IOC) RIGHT DIABETIC  9.93  00:49.4 (Right) - Diabetic  LENS:   Implant Name Type Inv. Item Serial No. Manufacturer Lot No. LRB No. Used Action  LENS IOL TECNIS EYHANCE 19.5 - [id number] Intraocular Lens LENS IOL TECNIS EYHANCE 19.5 [id number] [employer]  Right 1 Implanted         SURGEON:  Deirdre Evener, MD   ANESTHESIA:  Topical with tetracaine drops and 2% Xylocaine jelly, augmented with 1% preservative-free intracameral lidocaine.    COMPLICATIONS:  None.   DESCRIPTION OF PROCEDURE:  The patient was identified in the holding room and transported to the operating room and placed in the supine position under the operating microscope.  The right eye was identified as the operative eye and it was prepped and draped in the usual sterile ophthalmic fashion.   A 1 millimeter clear-corneal paracentesis was made at the 12:00 position.  0.5 ml of preservative-free 1% lidocaine was injected into the anterior chamber. The anterior chamber was filled with Viscoat viscoelastic.  A 2.4 millimeter keratome was used to make a near-clear corneal incision at the 9:00 position.  A curvilinear capsulorrhexis was made with a cystotome and capsulorrhexis forceps.  Balanced salt solution was used to hydrodissect and hydrodelineate the nucleus.   Phacoemulsification was then used in stop and chop fashion to remove the lens nucleus and epinucleus.  The remaining cortex was then removed using the irrigation and aspiration handpiece. Provisc was then placed into the capsular bag to distend it for lens placement.  A lens was then  injected into the capsular bag.  The remaining viscoelastic was aspirated.   Wounds were hydrated with balanced salt solution.  The anterior chamber was inflated to a physiologic pressure with balanced salt solution.  No wound leaks were noted. Cefuroxime 0.1 ml of a 10mg /ml solution was injected into the anterior chamber for a dose of 1 mg of intracameral antibiotic at the completion of the case.   Timolol and Brimonidine drops were applied to the eye.  The patient was taken to the recovery room in stable condition without complications of anesthesia or surgery.   Dustine Stickler 09/28/2022, 9:09 AM

## 2022-09-29 ENCOUNTER — Encounter: Payer: Self-pay | Admitting: Ophthalmology

## 2022-10-05 ENCOUNTER — Ambulatory Visit: Payer: Medicare Other | Attending: Otolaryngology

## 2022-10-05 DIAGNOSIS — R0683 Snoring: Secondary | ICD-10-CM | POA: Insufficient documentation

## 2022-10-05 DIAGNOSIS — R0681 Apnea, not elsewhere classified: Secondary | ICD-10-CM | POA: Diagnosis not present

## 2022-10-07 ENCOUNTER — Encounter: Payer: Self-pay | Admitting: Cardiology

## 2022-10-07 ENCOUNTER — Ambulatory Visit: Payer: Medicare Other | Attending: Cardiology | Admitting: Cardiology

## 2022-10-07 VITALS — BP 132/78 | HR 79 | Ht 71.0 in | Wt 267.2 lb

## 2022-10-07 DIAGNOSIS — Z6837 Body mass index (BMI) 37.0-37.9, adult: Secondary | ICD-10-CM

## 2022-10-07 DIAGNOSIS — I2510 Atherosclerotic heart disease of native coronary artery without angina pectoris: Secondary | ICD-10-CM | POA: Diagnosis not present

## 2022-10-07 DIAGNOSIS — E782 Mixed hyperlipidemia: Secondary | ICD-10-CM

## 2022-10-07 DIAGNOSIS — F17200 Nicotine dependence, unspecified, uncomplicated: Secondary | ICD-10-CM | POA: Diagnosis not present

## 2022-10-07 DIAGNOSIS — I10 Essential (primary) hypertension: Secondary | ICD-10-CM | POA: Diagnosis not present

## 2022-10-07 NOTE — Progress Notes (Signed)
Cardiology Office Note:    Date:  10/07/2022   ID:  James Lee, DOB 04/30/1951, MRN [id number]  PCP:  Dana Allan, MD    [facility] Providers Cardiologist:  Debbe Odea, MD     Referring MD: Dana Allan, MD   Chief Complaint  Patient presents with   Follow-up    Last seen by Armanda Magic, MD in 2022.  Patient denies new or acute cardiac problems/concerns today.      History of Present Illness:    James Lee is a 71 y.o. male with a hx of CAD (LAD and RCA coronary calcifications on chest CT ), hypertension, hyperlipidemia, diabetes, current smoker x 40+ years, COPD, OSA on CPAP who presents for follow-up.  Previously seen by our group in [place] due to history of CAD.  He is compliant medications as prescribed including aspirin, Crestor.  Denies chest pain, endorses shortness of breath which is chronic secondary to COPD.  He endorses redness and pain on his feet bilaterally secondary to diabetic neuropathy.  Chest CT lung cancer screening 06/2022 showed LAD and RCA calcifications. Lexiscan 07/2021 showed no ischemia, EF 56%   Past Medical History:  Diagnosis Date   Bacterial conjunctivitis of both eyes 02/27/2022   Benign essential HTN 09/02/2014   Bronchitis 03/14/2022   Cancer of prostate (HCC) 03/08/2011   Colon cancer screening 05/04/2022   Colon polyps    Coronary artery calcification seen on CAT scan    Coronary artery calcification seen on CAT scan 09/02/2014   Diabetes mellitus without complication (HCC)    Dyslipidemia    Elevated serum creatinine 11/11/2021   Gastric ulcer 11/29/2017   GERD (gastroesophageal reflux disease)    History of colonic polyps 05/03/2022   Hyperlipidemia    Hypertension    Lactic acidosis 11/11/2021   LBP (low back pain)    Male erectile dysfunction, unspecified 06/21/2011   NIDDM-2 with hyperglycemia 11/11/2021   Perforated bowel (HCC) 11/29/2017   Personal history of prostate cancer 08/16/2012   Prostate  cancer (HCC) 2014   prostate    RBBB    RBBB 04/27/2021   Reduced libido 08/16/2012   Scrotal pain 08/16/2012   Sleep apnea    c-pap wears   Syncope 11/10/2021    Past Surgical History:  Procedure Laterality Date   ABDOMINAL SURGERY     gastric ulcer   back sugery     1996, 2017, 2018   BACK SURGERY  1996   2017, 2018, 2019   CATARACT EXTRACTION Bilateral 2024   CATARACT EXTRACTION W/PHACO Left 09/14/2022   Procedure: CATARACT EXTRACTION PHACO AND INTRAOCULAR LENS PLACEMENT (IOC) LEFT DIABETIC 16.26 01:17.3;  Surgeon: Lockie Mola, MD;  Location: [facility];  Service: Ophthalmology;  Laterality: Left;   CATARACT EXTRACTION W/PHACO Right 09/28/2022   Procedure: CATARACT EXTRACTION PHACO AND INTRAOCULAR LENS PLACEMENT (IOC) RIGHT DIABETIC  9.93  00:49.4;  Surgeon: Lockie Mola, MD;  Location: [facility];  Service: Ophthalmology;  Laterality: Right;  Diabetic   COLONOSCOPY WITH PROPOFOL N/A 05/03/2022   Procedure: COLONOSCOPY WITH PROPOFOL;  Surgeon: Wyline Mood, MD;  Location: [facility] ENDOSCOPY;  Service: Gastroenterology;  Laterality: N/A;   COLONOSCOPY WITH PROPOFOL N/A 05/04/2022   Procedure: COLONOSCOPY WITH PROPOFOL;  Surgeon: Wyline Mood, MD;  Location: [facility] ENDOSCOPY;  Service: Gastroenterology;  Laterality: N/A;   gastric ulcers     surgery 11-28-17   INGUINAL HERNIA REPAIR     right   LAPAROSCOPY N/A 11/28/2017   Procedure: LAPAROSCOPY DIAGNOSTIC converted  to open;  Surgeon: Axel Filler, MD;  Location: [facility] OR;  Service: General;  Laterality: N/A;   LAPAROTOMY N/A 11/28/2017   Procedure: EXPLORATORY LAPAROTOMY;  Surgeon: Axel Filler, MD;  Location: [facility] OR;  Service: General;  Laterality: N/A;   MENISCUS REPAIR Right    POSTERIOR CERVICAL FUSION/FORAMINOTOMY N/A 11/10/2017   Procedure: Posterior cervical fusion with lateral mass fixation Cervical Three-Seven, cervical laminectomy Cervical Three- Seven;  Surgeon: Tia Alert, MD;   Location: [facility] OR;  Service: Neurosurgery;  Laterality: N/A;  Posterior cervical fusion with lateral mass fixation Cervical Three-Seven, cervical laminectomy Cervical Three- Seven   PROSTATECTOMY  01/2012   TONSILLECTOMY     TOOTH EXTRACTION      Current Medications: Current Meds  Medication Sig   amLODipine (NORVASC) 5 MG tablet Take 5 mg by mouth daily.   aspirin 81 MG tablet Take 1 tablet (81 mg total) by mouth daily.   chlorthalidone (HYGROTON) 50 MG tablet Take 50 mg by mouth daily.   Continuous Blood Gluc Sensor (FREESTYLE LIBRE 2 SENSOR) MISC Place 1 sensor to skin every 14 days   docusate sodium (COLACE) 100 MG capsule Take 100 mg by mouth daily.   FARXIGA 10 MG TABS tablet Take 10 mg by mouth daily.   Fluticasone-Umeclidin-Vilant (TRELEGY ELLIPTA) 200-62.5-25 MCG/ACT AEPB Inhale 1 puff into the lungs daily.   gabapentin (NEURONTIN) 300 MG capsule Take 600 mg by mouth 2 (two) times daily.   glipiZIDE (GLUCOTROL XL) 5 MG 24 hr tablet TAKE 1 TABLET(5 MG) BY MOUTH DAILY   metFORMIN (GLUCOPHAGE-XR) 500 MG 24 hr tablet Take 1,000 mg by mouth 2 (two) times daily with a meal.   metoprolol succinate (TOPROL-XL) 50 MG 24 hr tablet Take 1 tablet (50 mg total) by mouth daily. No additional refill seen at clinic   Multiple Vitamins-Minerals (MULTIVITAMIN WITH MINERALS) tablet Take 1 tablet by mouth daily.   olmesartan (BENICAR) 40 MG tablet Take 40 mg by mouth daily.   rosuvastatin (CRESTOR) 10 MG tablet Take 1 tablet (10 mg total) by mouth daily.   Semaglutide (RYBELSUS) 7 MG TABS Take 7 mg by mouth daily.     Allergies:   Patient has no known allergies.   Social History   Socioeconomic History   Marital status: Married    Spouse name: Not on file   Number of children: 2   Years of education: Not on file   Highest education level: Not on file  Occupational History   Occupation: Tel Psychologist, prison and probation services: [employer]  Tobacco Use   Smoking status: Every Day    Packs/day: 1.50     Years: 50.00    Additional pack years: 0.00    Total pack years: 75.00    Types: Cigarettes    Start date: 03/13/2012   Smokeless tobacco: Never  Vaping Use   Vaping Use: Never used  Substance and Sexual Activity   Alcohol use: Yes    Comment: occasionally   Drug use: No   Sexual activity: Not on file  Other Topics Concern   Not on file  Social History Narrative   Not on file   Social Determinants of Health   Financial Resource Strain: Low Risk  (06/09/2022)   Overall Financial Resource Strain (CARDIA)    Difficulty of Paying Living Expenses: Not hard at all  Food Insecurity: No Food Insecurity (06/09/2022)   Hunger Vital Sign    Worried About Running Out of Food in the Last Year: Never  true    Ran Out of Food in the Last Year: Never true  Transportation Needs: No Transportation Needs (06/09/2022)   PRAPARE - Administrator, Civil Service (Medical): No    Lack of Transportation (Non-Medical): No  Physical Activity: Unknown (06/09/2022)   Exercise Vital Sign    Days of Exercise per Week: 0 days    Minutes of Exercise per Session: Not on file  Stress: No Stress Concern Present (06/09/2022)   [employer] of Occupational Health - Occupational Stress Questionnaire    Feeling of Stress : Not at all  Social Connections: Unknown (06/09/2022)   Social Connection and Isolation Panel [NHANES]    Frequency of Communication with Friends and Family: Not on file    Frequency of Social Gatherings with Friends and Family: Not on file    Attends Religious Services: Not on file    Active Member of Clubs or Organizations: Not on file    Attends Banker Meetings: Not on file    Marital Status: Married     Family History: The patient's family history includes Alcohol abuse in his mother; Coronary artery disease in his mother; Depression in his sister; Diabetes in his sister; Drug abuse in his mother; Early death in his mother; Esophageal cancer in his father;  Hyperlipidemia in his mother; Hypertension in his mother; Prostate cancer (age of onset: 76) in his brother; Throat cancer in his father. There is no history of Colon cancer, Rectal cancer, or Stomach cancer.  ROS:   Please see the history of present illness.     All other systems reviewed and are negative.  EKGs/Labs/Other Studies Reviewed:    The following studies were reviewed today:   EKG:  EKG is  ordered today.  The ekg ordered today demonstrates normal sinus rhythm, occasional PVCs.  Recent Labs: 11/10/2021: B Natriuretic Peptide 20.1 02/24/2022: ALT 18; BUN 27; Creatinine, Ser 1.80; Hemoglobin 15.1; Platelets 228.0; Potassium 5.1; Sodium 143; TSH 5.42  Recent Lipid Panel    Component Value Date/Time   CHOL 141 02/24/2022 1048   CHOL 142 04/27/2021 0925   TRIG 342.0 (H) 02/24/2022 1048   HDL 28.90 (L) 02/24/2022 1048   HDL 30 (L) 04/27/2021 0925   CHOLHDL 5 02/24/2022 1048   VLDL 68.4 (H) 02/24/2022 1048   LDLCALC 75 04/27/2021 0925   LDLDIRECT 80.0 02/24/2022 1048     Risk Assessment/Calculations:               Physical Exam:    VS:  BP 132/78 (BP Location: Left Arm, Patient Position: Sitting, Cuff Size: Normal)   Pulse 79   Ht 5\' 11"  (1.803 m)   Wt 267 lb 3.2 oz (121.2 kg)   SpO2 95%   BMI 37.27 kg/m     Wt Readings from Last 3 Encounters:  10/07/22 267 lb 3.2 oz (121.2 kg)  09/28/22 265 lb 3.2 oz (120.3 kg)  09/14/22 263 lb 8 oz (119.5 kg)     GEN:  Well nourished, well developed in no acute distress HEENT: Normal NECK: No JVD; No carotid bruits LYMPHATICS: No lymphadenopathy CARDIAC: RRR, no murmurs, rubs, gallops RESPIRATORY:  Clear to auscultation without rales, wheezing or rhonchi  ABDOMEN: Soft, non-tender, non-distended MUSCULOSKELETAL: Trace edema; No deformity  SKIN: Warm and dry NEUROLOGIC:  Alert and oriented x 3 PSYCHIATRIC:  Normal affect   ASSESSMENT:    1. Coronary artery disease involving native coronary artery of native  heart, unspecified whether angina present  2. Primary hypertension   3. Mixed hyperlipidemia   4. Smoking   5. BMI 37.0-37.9, adult    PLAN:    In order of problems listed above:  CAD, LAD and RCA coronary calcifications.  Continue aspirin, Crestor.  Get echocardiogram.  Myoview showed no significant ischemia. Hypertension, continue Norvasc, chlorthalidone, Toprol-XL. Hyperlipidemia, continue Crestor 10 mg daily Current smoker, smoking cessation advised. Obesity, low-calorie diet, weight loss advised.  Follow-up in 2 months.      Medication Adjustments/Labs and Tests Ordered: Current medicines are reviewed at length with the patient today.  Concerns regarding medicines are outlined above.  Orders Placed This Encounter  Procedures   EKG 12-Lead   ECHOCARDIOGRAM COMPLETE   No orders of the defined types were placed in this encounter.   Patient Instructions  Medication Instructions:   None Ordered  *If you need a refill on your cardiac medications before your next appointment, please call your pharmacy*   Lab Work:  None Ordered  If you have labs (blood work) drawn today and your tests are completely normal, you will receive your results only by: MyChart Message (if you have MyChart) OR A paper copy in the mail If you have any lab test that is abnormal or we need to change your treatment, we will call you to review the results.   Testing/Procedures:  Your physician has requested that you have an echocardiogram. Echocardiography is a painless test that uses sound waves to create images of your heart. It provides your doctor with information about the size and shape of your heart and how well your heart's chambers and valves are working. This procedure takes approximately one hour. There are no restrictions for this procedure. Please do NOT wear cologne, perfume, aftershave, or lotions (deodorant is allowed). Please arrive 15 minutes prior to your appointment  time.    Follow-Up: At [facility], you and your health needs are our priority.  As part of our continuing mission to provide you with exceptional heart care, we have created designated Provider Care Teams.  These Care Teams include your primary Cardiologist (physician) and Advanced Practice Providers (APPs -  Physician Assistants and Nurse Practitioners) who all work together to provide you with the care you need, when you need it.  We recommend signing up for the patient portal called "MyChart".  Sign up information is provided on this After Visit Summary.  MyChart is used to connect with patients for Virtual Visits (Telemedicine).  Patients are able to view lab/test results, encounter notes, upcoming appointments, etc.  Non-urgent messages can be sent to your provider as well.   To learn more about what you can do with MyChart, go to [email or web address].    Your next appointment:   8 - 10  week(s)  Provider:   You may see Debbe Odea, MD or one of the following Advanced Practice Providers on your designated Care Team:   Nicolasa Ducking, NP Eula Listen, PA-C Cadence Fransico Michael, PA-C Charlsie Quest, NP    Signed, Debbe Odea, MD  10/07/2022 4:51 PM    [facility]

## 2022-10-07 NOTE — Patient Instructions (Signed)
Medication Instructions:   None Ordered  *If you need a refill on your cardiac medications before your next appointment, please call your pharmacy*   Lab Work:  None Ordered  If you have labs (blood work) drawn today and your tests are completely normal, you will receive your results only by: MyChart Message (if you have MyChart) OR A paper copy in the mail If you have any lab test that is abnormal or we need to change your treatment, we will call you to review the results.   Testing/Procedures:  Your physician has requested that you have an echocardiogram. Echocardiography is a painless test that uses sound waves to create images of your heart. It provides your doctor with information about the size and shape of your heart and how well your heart's chambers and valves are working. This procedure takes approximately one hour. There are no restrictions for this procedure. Please do NOT wear cologne, perfume, aftershave, or lotions (deodorant is allowed). Please arrive 15 minutes prior to your appointment time.    Follow-Up: At [facility], you and your health needs are our priority.  As part of our continuing mission to provide you with exceptional heart care, we have created designated Provider Care Teams.  These Care Teams include your primary Cardiologist (physician) and Advanced Practice Providers (APPs -  Physician Assistants and Nurse Practitioners) who all work together to provide you with the care you need, when you need it.  We recommend signing up for the patient portal called "MyChart".  Sign up information is provided on this After Visit Summary.  MyChart is used to connect with patients for Virtual Visits (Telemedicine).  Patients are able to view lab/test results, encounter notes, upcoming appointments, etc.  Non-urgent messages can be sent to your provider as well.   To learn more about what you can do with MyChart, go to [email or web address].    Your next  appointment:   8 - 10  week(s)  Provider:   You may see Debbe Odea, MD or one of the following Advanced Practice Providers on your designated Care Team:   Nicolasa Ducking, NP Eula Listen, PA-C Cadence Fransico Michael, PA-C Charlsie Quest, NP

## 2022-10-10 NOTE — Progress Notes (Deleted)
   SUBJECTIVE:   No chief complaint on file.  HPI Presents for follow up for DM . Seen in clinic on 04/11/2022.  Diabetes type 2 Asymptomatic.  Doing well since starting on Rybelsus 3 mg daily.  Was increased to 7 mg on 11/14 however unable to obtain secondary to financial constraints.  Picked up sample 3 mg from clinic on 12/27.  Since then he has been taking 3 mg every other day.  Reports blood glucose at home 160s.  Has had 1 high in 300s and 1 low 68.  Asymptomatic both times.  Otherwise feels doing well and tolerating medication.   Hypertension Asymptomatic.  Recently blood work done 01/30.  Follows with cardiology Dr. Mayford Knife, and nephrology, Dr. Melanee Spry.  Currently takes chlorthalidone 50 mg daily, metoprolol 50 mg daily, Benicar 40 mg daily and tolerating medications well.  Does not check blood pressure at home.  Denies any headaches, visual changes, chest pain or worsening shortness of breath.  No lower extremity edema.  Sleep apnea Endorses sleep apnea for years.  Requesting repeat sleep studies as machine is quite old and would like to see if may need an adjustment.  Reports only sleeps 4 hours a night.  Compliant with CPAP.  Colon cancer screening Recent colonoscopy showed multiple serrated and tubular adenomas.  Some tubular adenomas high grade dysplasia.  Was recommended to be evaluated by genetics whom he had an appointment with 01/30.  Reports has follow-up colonoscopy in 6 months.  Continues to follow with GI.   PERTINENT PMH / PSH: Diabetes type 2 CKD stage III Hypertension Sleep apnea Emphysema Hyperlipidemia Adenomatous polyps   OBJECTIVE:  There were no vitals taken for this visit.   Physical Exam  ASSESSMENT/PLAN:  Hypertension associated with diabetes (HCC)   PDMP reviewed  No follow-ups on file.  Dana Allan, MD

## 2022-10-11 ENCOUNTER — Telehealth: Payer: Self-pay

## 2022-10-11 ENCOUNTER — Ambulatory Visit: Payer: Medicare Other | Admitting: Family Medicine

## 2022-10-11 ENCOUNTER — Telehealth: Payer: Self-pay | Admitting: Family Medicine

## 2022-10-11 DIAGNOSIS — Z86010 Personal history of colonic polyps: Secondary | ICD-10-CM

## 2022-10-11 DIAGNOSIS — I152 Hypertension secondary to endocrine disorders: Secondary | ICD-10-CM

## 2022-10-11 MED ORDER — ROSUVASTATIN CALCIUM 10 MG PO TABS: 10.0000 mg | ORAL_TABLET | Freq: Every day | ORAL | 2 refills | Status: DC

## 2022-10-11 NOTE — Telephone Encounter (Signed)
Pt left message to schedule 6 mth return colonoscopy please return call

## 2022-10-11 NOTE — Addendum Note (Signed)
Addended by: Benedict Needy on: 10/11/2022 10:19 AM   Modules accepted: Orders

## 2022-10-11 NOTE — Telephone Encounter (Signed)
Prescription sent in.  Pt notified via MyChart message.

## 2022-10-11 NOTE — Telephone Encounter (Signed)
Prescription Request  10/11/2022  LOV: 07/13/2022  What is the name of the medication or equipment? rosuvastatin (CRESTOR) 10 MG tablet. Patient is out of medication   Have you contacted your pharmacy to request a refill? Yes   Which pharmacy would you like this sent to?  [id number] [place], [place] - [address] AT [facility] OF [address] [place] [address] Phone: [phone] Fax: [phone]    Patient notified that their request is being sent to the clinical staff for review and that they should receive a response within 2 business days.   Please advise at Mobile [phone] (mobile)

## 2022-10-11 NOTE — Telephone Encounter (Signed)
Returned patients call.  Left voice message to let him know that his colonoscopy is not due with Dr. Tobi Bastos until 05/05/23.  He will receive a reminder letter in the mail closer to the time to schedule, however if he would like to get it scheduled for November he can call back to do so.  Thanks,  [place], [place]

## 2022-10-11 NOTE — Telephone Encounter (Signed)
Patient is calling to schedule his repeat colonoscopy with Dr. Anna  °

## 2022-10-12 ENCOUNTER — Telehealth: Payer: Self-pay

## 2022-10-12 ENCOUNTER — Other Ambulatory Visit: Payer: Self-pay

## 2022-10-12 DIAGNOSIS — E1122 Type 2 diabetes mellitus with diabetic chronic kidney disease: Secondary | ICD-10-CM | POA: Diagnosis not present

## 2022-10-12 DIAGNOSIS — E785 Hyperlipidemia, unspecified: Secondary | ICD-10-CM | POA: Diagnosis not present

## 2022-10-12 DIAGNOSIS — Z86010 Personal history of colonic polyps: Secondary | ICD-10-CM

## 2022-10-12 DIAGNOSIS — N1831 Chronic kidney disease, stage 3a: Secondary | ICD-10-CM | POA: Diagnosis not present

## 2022-10-12 DIAGNOSIS — I1A0 Resistant hypertension: Secondary | ICD-10-CM | POA: Diagnosis not present

## 2022-10-12 MED ORDER — CLENPIQ 10-3.5-12 MG-GM -GM/160ML PO SOLN: 1.0000 | Freq: Once | ORAL | 0 refills | Status: AC

## 2022-10-12 NOTE — Telephone Encounter (Signed)
Phone call returned to patient.  Colonoscopy report noted to repeat in 6 months making colonoscopy due around 11/02/22.  Colonoscopy has been scheduled for 11/02/22 at [facility] with Dr. Tobi Bastos.

## 2022-10-12 NOTE — Telephone Encounter (Signed)
   Patient Name: James Lee  DOB: 10/31/1950 MRN: [id number]  Primary Cardiologist: Debbe Odea, MD  Chart reviewed as part of pre-operative protocol coverage. Given past medical history and time since last visit, based on ACC/AHA guidelines, James Lee is at acceptable risk for the planned procedure without further cardiovascular testing.   The patient was advised that if he develops new symptoms prior to surgery to contact our office to arrange for a follow-up visit, and he verbalized understanding.  Regarding ASA therapy, we recommend continuation of ASA throughout the perioperative period.  However, if the surgeon feels that cessation of ASA is required in the perioperative period, it may be stopped 5-7 days prior to surgery with a plan to resume it as soon as felt to be feasible from a surgical standpoint in the post-operative period.   I will route this recommendation to the requesting party via Epic fax function and remove from pre-op pool.  Please call with questions.  Napoleon Form, Leodis Rains, NP 10/12/2022, 9:13 PM

## 2022-10-12 NOTE — Telephone Encounter (Signed)
Dr. Azucena Cecil,  [name] is scheduled to complete a colonoscopy on 11/02/2022.  He was seen most recently in office by you on 10/07/2022.  Would he be at acceptable risk to proceed with his procedure based on your recent visit?  Please forward your comments and recommendations to P [facility].  Thank you, Robin Searing, NP

## 2022-10-12 NOTE — Telephone Encounter (Signed)
Cardiac Clearance sent to [facility].  Patient advised to stop diabetic meds as follows: IMPORTANT MEDICATION REMINDERS: STOP FARXIGA ON 10/30/22.  STOP GLIPIZIDE ON 11/01/22.  STOP METFORMIN ON 10/31/22.  STOP SEMAGLUTIDE (RYBELSUS) ON 10/26/22.  PLEASE NOTE IF MEDICATIONS ARE NOT STOPPED YOUR PROCEDURE MAY BE CANCELED.  Thanks, [place], [place]

## 2022-10-12 NOTE — Telephone Encounter (Signed)
   Pre-operative Risk Assessment    Patient Name: James Lee  DOB: 1951/02/09 MRN: [id number]      Request for Surgical Clearance    Procedure:   Colonoscopy  Date of Surgery:  Clearance 11/02/22                                 Surgeon:  Dr Wyline Mood Surgeon's Group or Practice Name:  [facility] Phone number:  [phone]  Fax number:  [phone]    Type of Clearance Requested:   - Medical  - Pharmacy:  Hold Aspirin     Type of Anesthesia:  General    Additional requests/questions:   N/A  SignedMelina Schools   10/12/2022, 9:36 AM

## 2022-10-12 NOTE — Telephone Encounter (Signed)
Returned patients call to schedule colonoscopy this morning.  Chart procedure note reviewed. Dr. Tobi Bastos noted repeat in 6 months on procedure report.  Colonoscopy has been scheduled for 11/02/22. Cardiac clearance sent to [facility] Pre[facility].  Patient has been advised of stop dates for his diabetic meds as follows:  IMPORTANT MEDICATION REMINDERS: STOP FARXIGA ON 10/30/22.  STOP GLIPIZIDE ON 11/01/22.  STOP METFORMIN ON 10/31/22.  STOP SEMAGLUTIDE (RYBELSUS) ON 10/26/22.  PLEASE NOTE IF MEDICATIONS ARE NOT STOPPED YOUR PROCEDURE MAY BE CANCELED.  Thanks, [place], [place]

## 2022-10-12 NOTE — Addendum Note (Signed)
Addended by: Avie Arenas on: 10/12/2022 09:29 AM   Modules accepted: Orders

## 2022-10-12 NOTE — Addendum Note (Signed)
Addended by: Avie Arenas on: 10/12/2022 09:07 AM   Modules accepted: Orders

## 2022-10-14 ENCOUNTER — Telehealth (HOSPITAL_BASED_OUTPATIENT_CLINIC_OR_DEPARTMENT_OTHER): Payer: Medicare Other | Admitting: Pulmonary Disease

## 2022-10-14 DIAGNOSIS — G4733 Obstructive sleep apnea (adult) (pediatric): Secondary | ICD-10-CM

## 2022-10-14 NOTE — Telephone Encounter (Signed)
  Severe OSA CPAP 15 cm or auto 12-16 cm with med FF mask

## 2022-10-14 NOTE — Telephone Encounter (Signed)
ATC the patient. LVM for patient to return my call. 

## 2022-10-17 NOTE — Telephone Encounter (Signed)
Spoke to patient and relayed below results/recommendations.  He would like set pressure. Order has been place to adapt.  He will call back to schedule appt once setup on machine.  Nothing further needed.

## 2022-10-17 NOTE — Addendum Note (Signed)
Addended by: Lajoyce Lauber A on: 10/17/2022 10:10 AM   Modules accepted: Orders

## 2022-10-18 ENCOUNTER — Other Ambulatory Visit
Admission: RE | Admit: 2022-10-18 | Discharge: 2022-10-18 | Disposition: A | Discharge status: Home / Self Care | Payer: Medicare Other | Source: Ambulatory Visit | Attending: Ophthalmology | Admitting: Ophthalmology

## 2022-10-18 DIAGNOSIS — H532 Diplopia: Secondary | ICD-10-CM | POA: Diagnosis not present

## 2022-10-18 DIAGNOSIS — H524 Presbyopia: Secondary | ICD-10-CM | POA: Diagnosis not present

## 2022-10-20 ENCOUNTER — Encounter: Payer: Self-pay | Admitting: Licensed Clinical Social Worker

## 2022-10-20 LAB — ACETYLCHOLINE RECEPTOR, BINDING: Acety choline binding ab: <0.03 nmol/L (ref 0.00–0.24)

## 2022-10-20 NOTE — Progress Notes (Signed)
UPDATE: VUS in [facility] called  c.[id number] ([id number])  has been reclassified to "Benign." The amended report date is 10/05/2022.

## 2022-10-21 DIAGNOSIS — E1122 Type 2 diabetes mellitus with diabetic chronic kidney disease: Secondary | ICD-10-CM | POA: Diagnosis not present

## 2022-10-21 MED ORDER — NA SULFATE-K SULFATE-MG SULF 17.5-3.13-1.6 GM/177ML PO SOLN: 354.0000 mL | Freq: Once | ORAL | 0 refills | Status: AC

## 2022-10-21 NOTE — Addendum Note (Signed)
Addended by: Adela Ports on: 10/21/2022 09:53 AM   Modules accepted: Orders

## 2022-10-24 ENCOUNTER — Encounter: Payer: Self-pay | Admitting: Family Medicine

## 2022-10-24 ENCOUNTER — Ambulatory Visit (INDEPENDENT_AMBULATORY_CARE_PROVIDER_SITE_OTHER): Payer: Medicare Other | Admitting: Family Medicine

## 2022-10-24 VITALS — BP 132/78 | HR 76 | Ht 71.0 in | Wt 267.0 lb

## 2022-10-24 DIAGNOSIS — L989 Disorder of the skin and subcutaneous tissue, unspecified: Secondary | ICD-10-CM

## 2022-10-24 DIAGNOSIS — N1832 Chronic kidney disease, stage 3b: Secondary | ICD-10-CM | POA: Diagnosis not present

## 2022-10-24 DIAGNOSIS — Z6837 Body mass index (BMI) 37.0-37.9, adult: Secondary | ICD-10-CM

## 2022-10-24 DIAGNOSIS — Z7984 Long term (current) use of oral hypoglycemic drugs: Secondary | ICD-10-CM | POA: Diagnosis not present

## 2022-10-24 DIAGNOSIS — E1122 Type 2 diabetes mellitus with diabetic chronic kidney disease: Secondary | ICD-10-CM

## 2022-10-24 DIAGNOSIS — E1159 Type 2 diabetes mellitus with other circulatory complications: Secondary | ICD-10-CM

## 2022-10-24 DIAGNOSIS — I152 Hypertension secondary to endocrine disorders: Secondary | ICD-10-CM

## 2022-10-24 LAB — POCT GLYCOSYLATED HEMOGLOBIN (HGB A1C): Hemoglobin A1C: 7 % — AB (ref 4.0–5.6)

## 2022-10-24 MED ORDER — RYBELSUS 14 MG PO TABS
14.0000 mg | ORAL_TABLET | Freq: Every day | ORAL | 12 refills | Status: AC
Start: 2022-10-24 — End: ?

## 2022-10-24 MED ORDER — FARXIGA 10 MG PO TABS: 10.0000 mg | ORAL_TABLET | Freq: Every day | ORAL | 3 refills | Status: DC

## 2022-10-24 MED ORDER — METFORMIN HCL ER 500 MG PO TB24
1000.0000 mg | ORAL_TABLET | Freq: Two times a day (BID) | ORAL | 3 refills | Status: DC
Start: 2022-10-24 — End: 2023-10-30

## 2022-10-24 NOTE — Patient Instructions (Addendum)
It was a pleasure meeting you today. Thank you for allowing me to take part in your health care.  Our goals for today as we discussed include:  Referral sent to Dermatology  A1c today is 7.0 Increase Rybelsus 14 mg daily Discontinue Glipizide  Refill sent for requested medications.  Follow up in 3 months  If you have any questions or concerns, please do not hesitate to call the office at [phone].  I look forward to our next visit and until then take care and stay safe.  Regards,   Dana Allan, MD   [facility]

## 2022-10-24 NOTE — Progress Notes (Signed)
SUBJECTIVE:   Chief Complaint  Patient presents with   Medical Management of Chronic Issues   HPI Presents to clinic for chronic disease management follow-up  DM type II Asymptomatic.  Denies any polyuria, polyphagia, polydipsia.  Tolerating Rybelsus 7 mg daily.  Weight increased 2 pounds since last visit.  Needs refill on metformin and Farxiga.  Endorses having some hypoglycemic events to 68.  Reports blood sugars at home consistent around 119.  On statin and ARB therapy.  Hypertension Asymptomatic.  Is followed by nephrology and cardiology compliant with chlorthalidone 50 mg daily Benicar 40 mg daily, amlodipine 5 mg daily and metoprolol XL 50 mg daily.   Requesting dermatology referral Small scaly area to nasal bridge that has not been improving.  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  Denies any bleeding or trauma.  PERTINENT PMH / PSH: CKD stage IIIb Hypertension Diabetes type 2 Hyperlipidemia Obesity class II  OBJECTIVE:  BP 132/78   Pulse 76   Ht 5\' 11"  (1.803 m)   Wt 267 lb (121.1 kg)   SpO2 96%   BMI 37.24 kg/m    Physical Exam Vitals reviewed.  Constitutional:      General: He is not in acute distress.    Appearance: Normal appearance. He is obese. He is not ill-appearing, toxic-appearing or diaphoretic.  Eyes:     General:        Right eye: No discharge.        Left eye: No discharge.  Neck:     Thyroid: No thyromegaly or thyroid tenderness.  Cardiovascular:     Rate and Rhythm: Normal rate and regular rhythm.     Heart sounds: Normal heart sounds.  Pulmonary:     Effort: Pulmonary effort is normal.     Breath sounds: Normal breath sounds.  Abdominal:     General: Bowel sounds are normal.  Musculoskeletal:        General: Normal range of motion.     Cervical back: Normal range of motion.  Skin:    General: Skin is warm and dry.  Neurological:     Mental Status: He is alert and oriented to person, place, and time. Mental status is at baseline.   Psychiatric:        Mood and Affect: Mood normal.        Behavior: Behavior normal.        Thought Content: Thought content normal.        Judgment: Judgment normal.     ASSESSMENT/PLAN:  Type 2 diabetes mellitus with stage 3b chronic kidney disease, without long-term current use of insulin (HCC) Assessment & Plan: Chronic.  Stable. -Refill Farxiga 10 mg daily -Increase Rybelsus to 14 mg daily -Refill Metformin 1000 mg  BID -Discontinue glipizide 5 mg daily secondary to hypoglycemic events -Continue Crestor 10 mg daily -Diabetic eye exam completed. -Follow-up with nephrology as scheduled -Follow up in 3 months    Orders: -     Farxiga; Take 1 tablet (10 mg total) by mouth daily.  Dispense: 90 tablet; Refill: 3 -     metFORMIN HCl ER; Take 2 tablets (1,000 mg total) by mouth 2 (two) times daily with a meal.  Dispense: 360 tablet; Refill: 3 -     POCT glycosylated hemoglobin (Hb A1C) -     Rybelsus; Take 1 tablet (14 mg total) by mouth daily.  Dispense: 30 tablet; Refill: 12  Hypertension associated with diabetes (HCC) Assessment & Plan: Chronic.  Stable.  Initially elevated  132/76.  Repeat at goal goal <130/80.  -Continue Amlodipine 5 mg daily -Continue Benicar 40 mg daily -Continue Chlorthalidone 50 mg daily -Continue Metoprolol 50 mg daily -Continue to monitor BP at home  -Follows with nephrology and cardiology.     Class 2 severe obesity with serious comorbidity and body mass index (BMI) of 37.0 to 37.9 in adult, unspecified obesity type [facility]) Assessment & Plan: Chronic.  BMI with serious comorbidities hypertension, DM type II, hyperlipidemia. Increase Rybelsus to 14 mg daily Continue portion control meals and healthy lifestyle choices.   Skin lesion Assessment & Plan: New problem.  Left outer portion of nasal bridge.  History of outdoor work. Will refer to dermatology for further evaluation.    PDMP reviewed  Return in about 3 months (around  01/24/2023).  Dana Allan, MD

## 2022-10-28 ENCOUNTER — Telehealth: Payer: Self-pay

## 2022-10-28 NOTE — Telephone Encounter (Signed)
Noted  

## 2022-10-28 NOTE — Telephone Encounter (Signed)
Rybelsus 7mg  arrived for [employer] pt assistance arrived.  Pt was seen 10/24/22 and Rybelsus increased to 14mg  daily.  Called and spoke with pt, he is aware to take 2 tablets of 7mg  daily.  Have also completed application to increase medications to 14mg  and have left in Dr. Claris Che folder to sign.  Pt is aware that application process has started for the new dose.

## 2022-11-02 ENCOUNTER — Ambulatory Visit
Admission: RE | Admit: 2022-11-02 | Discharge: 2022-11-02 | Disposition: A | Discharge status: Home / Self Care | Payer: Medicare Other | Attending: Gastroenterology | Admitting: Gastroenterology

## 2022-11-02 ENCOUNTER — Ambulatory Visit: Payer: Medicare Other

## 2022-11-02 ENCOUNTER — Encounter
Admission: RE | Disposition: A | Discharge status: Home / Self Care | Payer: Self-pay | Source: Home / Self Care | Attending: Gastroenterology

## 2022-11-02 DIAGNOSIS — Z8711 Personal history of peptic ulcer disease: Secondary | ICD-10-CM | POA: Insufficient documentation

## 2022-11-02 DIAGNOSIS — D120 Benign neoplasm of cecum: Secondary | ICD-10-CM | POA: Diagnosis not present

## 2022-11-02 DIAGNOSIS — D122 Benign neoplasm of ascending colon: Secondary | ICD-10-CM | POA: Diagnosis not present

## 2022-11-02 DIAGNOSIS — E119 Type 2 diabetes mellitus without complications: Secondary | ICD-10-CM | POA: Diagnosis not present

## 2022-11-02 DIAGNOSIS — Z86010 Personal history of colonic polyps: Secondary | ICD-10-CM

## 2022-11-02 DIAGNOSIS — F17210 Nicotine dependence, cigarettes, uncomplicated: Secondary | ICD-10-CM | POA: Diagnosis not present

## 2022-11-02 DIAGNOSIS — K635 Polyp of colon: Secondary | ICD-10-CM | POA: Insufficient documentation

## 2022-11-02 DIAGNOSIS — I2510 Atherosclerotic heart disease of native coronary artery without angina pectoris: Secondary | ICD-10-CM | POA: Diagnosis not present

## 2022-11-02 DIAGNOSIS — J449 Chronic obstructive pulmonary disease, unspecified: Secondary | ICD-10-CM | POA: Insufficient documentation

## 2022-11-02 DIAGNOSIS — G4730 Sleep apnea, unspecified: Secondary | ICD-10-CM | POA: Insufficient documentation

## 2022-11-02 DIAGNOSIS — D126 Benign neoplasm of colon, unspecified: Secondary | ICD-10-CM

## 2022-11-02 DIAGNOSIS — Z1211 Encounter for screening for malignant neoplasm of colon: Secondary | ICD-10-CM | POA: Insufficient documentation

## 2022-11-02 DIAGNOSIS — I10 Essential (primary) hypertension: Secondary | ICD-10-CM | POA: Insufficient documentation

## 2022-11-02 HISTORY — PX: COLONOSCOPY WITH PROPOFOL: SHX5780

## 2022-11-02 LAB — GLUCOSE, CAPILLARY: Glucose-Capillary: 176 mg/dL — ABNORMAL HIGH (ref 70–99)

## 2022-11-02 SURGERY — COLONOSCOPY WITH PROPOFOL
Anesthesia: General

## 2022-11-02 MED ORDER — PROPOFOL 500 MG/50ML IV EMUL
INTRAVENOUS | Status: DC | PRN
  Administered 2022-11-02: 125 ug/kg/min via INTRAVENOUS

## 2022-11-02 MED ORDER — PROPOFOL 10 MG/ML IV BOLUS
INTRAVENOUS | Status: DC | PRN
  Administered 2022-11-02: 80 mg via INTRAVENOUS
  Administered 2022-11-02: 20 mg via INTRAVENOUS

## 2022-11-02 MED ORDER — SODIUM CHLORIDE 0.9 % IV SOLN: INTRAVENOUS | Status: DC

## 2022-11-02 MED ORDER — LIDOCAINE HCL (CARDIAC) PF 100 MG/5ML IV SOSY
PREFILLED_SYRINGE | INTRAVENOUS | Status: DC | PRN
  Administered 2022-11-02: 50 mg via INTRAVENOUS

## 2022-11-02 NOTE — Anesthesia Preprocedure Evaluation (Signed)
Anesthesia Evaluation  Patient identified by MRN, date of birth, ID band Patient awake    Reviewed: Allergy & Precautions, NPO status , Patient's Chart, lab work & pertinent test results  History of Anesthesia Complications Negative for: history of anesthetic complications  Airway Mallampati: III  TM Distance: <3 FB Neck ROM: full    Dental  (+) Chipped, Poor Dentition, Missing   Pulmonary sleep apnea , COPD, Current Smoker   Pulmonary exam normal        Cardiovascular Exercise Tolerance: Good hypertension, + CAD and + DOE  Normal cardiovascular exam+ dysrhythmias (RBBB)      Neuro/Psych  PSYCHIATRIC DISORDERS      negative neurological ROS     GI/Hepatic Neg liver ROS, PUD,GERD  ,,  Endo/Other  diabetes, Type 2    Renal/GU Renal disease  negative genitourinary   Musculoskeletal   Abdominal   Peds  Hematology negative hematology ROS (+)   Anesthesia Other Findings Past Medical History: 02/27/2022: Bacterial conjunctivitis of both eyes 09/02/2014: Benign essential HTN 03/14/2022: Bronchitis 03/08/2011: Cancer of prostate (HCC) 05/04/2022: Colon cancer screening No date: Colon polyps No date: Coronary artery calcification seen on CAT scan 09/02/2014: Coronary artery calcification seen on CAT scan No date: Diabetes mellitus without complication (HCC) No date: Dyslipidemia 11/11/2021: Elevated serum creatinine 11/29/2017: Gastric ulcer No date: GERD (gastroesophageal reflux disease) 05/03/2022: History of colonic polyps No date: Hyperlipidemia No date: Hypertension 11/11/2021: Lactic acidosis No date: LBP (low back pain) 06/21/2011: Male erectile dysfunction, unspecified 11/11/2021: NIDDM-2 with hyperglycemia 11/29/2017: Perforated bowel (HCC) 08/16/2012: Personal history of prostate cancer 2014: Prostate cancer (HCC)     Comment:  prostate  No date: RBBB 04/27/2021: RBBB 08/16/2012: Reduced  libido 08/16/2012: Scrotal pain No date: Sleep apnea     Comment:  c-pap wears 11/10/2021: Syncope  Past Surgical History: No date: ABDOMINAL SURGERY     Comment:  gastric ulcer No date: back sugery     Comment:  1996, 2017, 2018 1996: BACK SURGERY     Comment:  2017, 2018, 2019 2024: CATARACT EXTRACTION; Bilateral 09/14/2022: CATARACT EXTRACTION W/PHACO; Left     Comment:  Procedure: CATARACT EXTRACTION PHACO AND INTRAOCULAR               LENS PLACEMENT (IOC) LEFT DIABETIC 16.26 01:17.3;                Surgeon: Lockie Mola, MD;  Location: [facility]               SURGERY CNTR;  Service: Ophthalmology;  Laterality: Left; 09/28/2022: CATARACT EXTRACTION W/PHACO; Right     Comment:  Procedure: CATARACT EXTRACTION PHACO AND INTRAOCULAR               LENS PLACEMENT (IOC) RIGHT DIABETIC  9.93  00:49.4;                Surgeon: Lockie Mola, MD;  Location: [facility]               SURGERY CNTR;  Service: Ophthalmology;  Laterality:               Right;  Diabetic 05/03/2022: COLONOSCOPY WITH PROPOFOL; N/A     Comment:  Procedure: COLONOSCOPY WITH PROPOFOL;  Surgeon: Wyline Mood, MD;  Location: [facility] ENDOSCOPY;  Service:               Gastroenterology;  Laterality: N/A; 05/04/2022: COLONOSCOPY WITH PROPOFOL; N/A  Comment:  Procedure: COLONOSCOPY WITH PROPOFOL;  Surgeon: Wyline Mood, MD;  Location: [facility] ENDOSCOPY;  Service:               Gastroenterology;  Laterality: N/A; No date: gastric ulcers     Comment:  surgery 11-28-17 No date: INGUINAL HERNIA REPAIR     Comment:  right 11/28/2017: LAPAROSCOPY; N/A     Comment:  Procedure: LAPAROSCOPY DIAGNOSTIC converted to open;                Surgeon: Axel Filler, MD;  Location: [facility] OR;                Service: General;  Laterality: N/A; 11/28/2017: LAPAROTOMY; N/A     Comment:  Procedure: EXPLORATORY LAPAROTOMY;  Surgeon: Axel Filler, MD;  Location: [facility] OR;  Service: General;                 Laterality: N/A; No date: MENISCUS REPAIR; Right 11/10/2017: POSTERIOR CERVICAL FUSION/FORAMINOTOMY; N/A     Comment:  Procedure: Posterior cervical fusion with lateral mass               fixation Cervical Three-Seven, cervical laminectomy               Cervical Three- Seven;  Surgeon: Tia Alert, MD;                Location: [facility] OR;  Service: Neurosurgery;  Laterality:               N/A;  Posterior cervical fusion with lateral mass               fixation Cervical Three-Seven, cervical laminectomy               Cervical Three- Seven 01/2012: PROSTATECTOMY No date: TONSILLECTOMY No date: TOOTH EXTRACTION  BMI    Body Mass Index: 35.98 kg/m      Reproductive/Obstetrics negative OB ROS                             Anesthesia Physical Anesthesia Plan  ASA: 3  Anesthesia Plan: General   Post-op Pain Management:    Induction: Intravenous  PONV Risk Score and Plan: Propofol infusion and TIVA  Airway Management Planned: Natural Airway and Nasal Cannula  Additional Equipment:   Intra-op Plan:   Post-operative Plan:   Informed Consent: I have reviewed the patients History and Physical, chart, labs and discussed the procedure including the risks, benefits and alternatives for the proposed anesthesia with the patient or authorized representative who has indicated his/her understanding and acceptance.     Dental Advisory Given  Plan Discussed with: Anesthesiologist, CRNA and Surgeon  Anesthesia Plan Comments: (Patient consented for risks of anesthesia including but not limited to:  - adverse reactions to medications - risk of airway placement if required - damage to eyes, teeth, lips or other oral mucosa - nerve damage due to positioning  - sore throat or hoarseness - Damage to heart, brain, nerves, lungs, other parts of body or loss of life  Patient voiced understanding.)       Anesthesia Quick Evaluation

## 2022-11-02 NOTE — Anesthesia Postprocedure Evaluation (Signed)
Anesthesia Post Note  Patient: Phares Diemert  Procedure(s) Performed: COLONOSCOPY WITH PROPOFOL  Patient location during evaluation: Endoscopy Anesthesia Type: General Level of consciousness: awake and alert Pain management: pain level controlled Vital Signs Assessment: post-procedure vital signs reviewed and stable Respiratory status: spontaneous breathing, nonlabored ventilation, respiratory function stable and patient connected to nasal cannula oxygen Cardiovascular status: blood pressure returned to baseline and stable Postop Assessment: no apparent nausea or vomiting Anesthetic complications: no   No notable events documented.   Last Vitals:  Vitals:   11/02/22 0849 11/02/22 0859  BP: 123/63 127/72  Pulse: 75 72  Resp: 15 12  Temp:    SpO2: 97% 99%    Last Pain:  Vitals:   11/02/22 0859  TempSrc:   PainSc: 0-No pain                 Cleda Mccreedy Camron Essman

## 2022-11-02 NOTE — Op Note (Signed)
[facility] Gastroenterology Patient Name: James Lee Procedure Date: 11/02/2022 8:08 AM MRN: [id number] Account #: [id number] Date of Birth: 1951/06/01 Admit Type: Outpatient Age: 72 Room: [facility] ENDO ROOM 3 Gender: Male Note Status: Finalized Instrument Name: Prentice Docker [id number] Procedure:             Colonoscopy Indications:           Surveillance: Piecemeal removal of large sessile                         adenoma last colonoscopy (< 3 yrs), Last colonoscopy:                         November 2023 Providers:             Wyline Mood MD, MD Referring MD:          Dana Allan (Referring MD) Medicines:             Monitored Anesthesia Care Complications:         No immediate complications. Procedure:             Pre-Anesthesia Assessment:                        - Prior to the procedure, a History and Physical was                         performed, and patient medications, allergies and                         sensitivities were reviewed. The patient's tolerance                         of previous anesthesia was reviewed.                        - The risks and benefits of the procedure and the                         sedation options and risks were discussed with the                         patient. All questions were answered and informed                         consent was obtained.                        - ASA Grade Assessment: II - A patient with mild                         systemic disease.                        After obtaining informed consent, the colonoscope was                         passed under direct vision. Throughout the procedure,                         the patient's blood pressure, pulse,  and oxygen                         saturations were monitored continuously. The                         Colonoscope was introduced through the anus and                         advanced to the the cecum, identified by the                         appendiceal  orifice. The colonoscopy was performed                         with ease. The patient tolerated the procedure well.                         The quality of the bowel preparation was good. The                         ileocecal valve, appendiceal orifice, and rectum were                         photographed. Findings:      The perianal and digital rectal examinations were normal.      Three sessile polyps were found in the rectum. The polyps were 5 to 6 mm       in size. These polyps were removed with a cold snare. Resection and       retrieval were complete.      Three sessile polyps were found in the descending colon. The polyps were       5 to 6 mm in size. These polyps were removed with a cold snare.       Resection and retrieval were complete.      Four sessile polyps were found in the cecum. The polyps were 4 to 5 mm       in size. These polyps were removed with a cold snare. Resection and       retrieval were complete.      Two sessile polyps were found in the ascending colon. The polyps were 5       to 9 mm in size. These polyps were removed with a cold snare. Resection       and retrieval were complete.      The exam was otherwise without abnormality on direct and retroflexion       views. Impression:            - Three 5 to 6 mm polyps in the rectum, removed with a                         cold snare. Resected and retrieved.                        - Three 5 to 6 mm polyps in the descending colon,                         removed with a cold snare. Resected and retrieved.                        -  Four 4 to 5 mm polyps in the cecum, removed with a                         cold snare. Resected and retrieved.                        - Two 5 to 9 mm polyps in the ascending colon, removed                         with a cold snare. Resected and retrieved.                        - The examination was otherwise normal on direct and                         retroflexion views. Recommendation:         - Discharge patient to home (with escort).                        - Resume previous diet.                        - Continue present medications.                        - Await pathology results.                        - Repeat colonoscopy in 1 year for surveillance. Procedure Code(s):     --- Professional ---                        [phone], Colonoscopy, flexible; with removal of                         tumor(s), polyp(s), or other lesion(s) by snare                         technique Diagnosis Code(s):     --- Professional ---                        Z86.010, Personal history of colonic polyps                        D12.8, Benign neoplasm of rectum                        D12.4, Benign neoplasm of descending colon                        D12.0, Benign neoplasm of cecum                        D12.2, Benign neoplasm of ascending colon CPT copyright 2022 [employer]. All rights reserved. The codes documented in this report are preliminary and upon coder review may  be revised to meet current compliance requirements. Wyline Mood, MD Wyline Mood MD, MD 11/02/2022 8:35:50 AM This report has been signed electronically. Number of Addenda: 0 Note Initiated On: 11/02/2022 8:08 AM Total Procedure Duration: 0 hours  17 minutes 31 seconds  Estimated Blood Loss:  Estimated blood loss: none.      [facility]

## 2022-11-02 NOTE — H&P (Signed)
Wyline Mood, MD [address], [address], [place], [place], [address] [address], [address], [place], [place], [address] Phone: [phone]  Fax: [phone]  Primary Care Physician:  Dana Allan, MD   Pre-Procedure History & Physical: HPI:  James Lee is a 72 y.o. male is here for an colonoscopy.   Past Medical History:  Diagnosis Date   Bacterial conjunctivitis of both eyes 02/27/2022   Benign essential HTN 09/02/2014   Bronchitis 03/14/2022   Cancer of prostate (HCC) 03/08/2011   Colon cancer screening 05/04/2022   Colon polyps    Coronary artery calcification seen on CAT scan    Coronary artery calcification seen on CAT scan 09/02/2014   Diabetes mellitus without complication (HCC)    Dyslipidemia    Elevated serum creatinine 11/11/2021   Gastric ulcer 11/29/2017   GERD (gastroesophageal reflux disease)    History of colonic polyps 05/03/2022   Hyperlipidemia    Hypertension    Lactic acidosis 11/11/2021   LBP (low back pain)    Male erectile dysfunction, unspecified 06/21/2011   NIDDM-2 with hyperglycemia 11/11/2021   Perforated bowel (HCC) 11/29/2017   Personal history of prostate cancer 08/16/2012   Prostate cancer (HCC) 2014   prostate    RBBB    RBBB 04/27/2021   Reduced libido 08/16/2012   Scrotal pain 08/16/2012   Sleep apnea    c-pap wears   Syncope 11/10/2021    Past Surgical History:  Procedure Laterality Date   ABDOMINAL SURGERY     gastric ulcer   back sugery     1996, 2017, 2018   BACK SURGERY  1996   2017, 2018, 2019   CATARACT EXTRACTION Bilateral 2024   CATARACT EXTRACTION W/PHACO Left 09/14/2022   Procedure: CATARACT EXTRACTION PHACO AND INTRAOCULAR LENS PLACEMENT (IOC) LEFT DIABETIC 16.26 01:17.3;  Surgeon: Lockie Mola, MD;  Location: [facility];  Service: Ophthalmology;  Laterality: Left;   CATARACT EXTRACTION W/PHACO Right 09/28/2022   Procedure: CATARACT EXTRACTION PHACO AND INTRAOCULAR LENS PLACEMENT (IOC)  RIGHT DIABETIC  9.93  00:49.4;  Surgeon: Lockie Mola, MD;  Location: [facility];  Service: Ophthalmology;  Laterality: Right;  Diabetic   COLONOSCOPY WITH PROPOFOL N/A 05/03/2022   Procedure: COLONOSCOPY WITH PROPOFOL;  Surgeon: Wyline Mood, MD;  Location: [facility] ENDOSCOPY;  Service: Gastroenterology;  Laterality: N/A;   COLONOSCOPY WITH PROPOFOL N/A 05/04/2022   Procedure: COLONOSCOPY WITH PROPOFOL;  Surgeon: Wyline Mood, MD;  Location: [facility] ENDOSCOPY;  Service: Gastroenterology;  Laterality: N/A;   gastric ulcers     surgery 11-28-17   INGUINAL HERNIA REPAIR     right   LAPAROSCOPY N/A 11/28/2017   Procedure: LAPAROSCOPY DIAGNOSTIC converted to open;  Surgeon: Axel Filler, MD;  Location: [facility] OR;  Service: General;  Laterality: N/A;   LAPAROTOMY N/A 11/28/2017   Procedure: EXPLORATORY LAPAROTOMY;  Surgeon: Axel Filler, MD;  Location: [facility] OR;  Service: General;  Laterality: N/A;   MENISCUS REPAIR Right    POSTERIOR CERVICAL FUSION/FORAMINOTOMY N/A 11/10/2017   Procedure: Posterior cervical fusion with lateral mass fixation Cervical Three-Seven, cervical laminectomy Cervical Three- Seven;  Surgeon: Tia Alert, MD;  Location: [facility] OR;  Service: Neurosurgery;  Laterality: N/A;  Posterior cervical fusion with lateral mass fixation Cervical Three-Seven, cervical laminectomy Cervical Three- Seven   PROSTATECTOMY  01/2012   TONSILLECTOMY     TOOTH EXTRACTION      Prior to Admission medications   Medication Sig Start Date End Date Taking? Authorizing Provider  amLODipine (NORVASC) 5 MG  tablet Take 5 mg by mouth daily.   Yes [provider]  aspirin 81 MG tablet Take 1 tablet (81 mg total) by mouth daily. 03/16/18  Yes Costella, Darci Current, PA-C  chlorthalidone (HYGROTON) 50 MG tablet Take 50 mg by mouth daily. 12/31/21  Yes [provider]  Fluticasone-Umeclidin-Vilant (TRELEGY ELLIPTA) 200-62.5-25 MCG/ACT AEPB Inhale 1 Act into the lungs daily. 08/30/22  Yes  Erin Fulling, MD  gabapentin (NEURONTIN) 300 MG capsule Take 600 mg by mouth 2 (two) times daily. 10/08/15  Yes [provider]  metFORMIN (GLUCOPHAGE-XR) 500 MG 24 hr tablet Take 2 tablets (1,000 mg total) by mouth 2 (two) times daily with a meal. 10/24/22  Yes Dana Allan, MD  metoprolol succinate (TOPROL-XL) 50 MG 24 hr tablet Take 1 tablet (50 mg total) by mouth daily. No additional refill seen at clinic 09/07/22  Yes Agbor-Etang, Arlys Roderic, MD  olmesartan (BENICAR) 40 MG tablet Take 40 mg by mouth daily. 06/20/22  Yes [provider]  rosuvastatin (CRESTOR) 10 MG tablet Take 1 tablet (10 mg total) by mouth daily. 10/11/22  Yes Dana Allan, MD  Semaglutide (RYBELSUS) 14 MG TABS Take 1 tablet (14 mg total) by mouth daily. 10/24/22  Yes Dana Allan, MD  Continuous Blood Gluc Sensor (FREESTYLE LIBRE 2 SENSOR) MISC Place 1 sensor to skin every 14 days 08/09/22   Dana Allan, MD  docusate sodium (COLACE) 100 MG capsule Take 100 mg by mouth daily.    [provider]  FARXIGA 10 MG TABS tablet Take 1 tablet (10 mg total) by mouth daily. 10/24/22   Dana Allan, MD  glipiZIDE (GLUCOTROL XL) 5 MG 24 hr tablet TAKE 1 TABLET(5 MG) BY MOUTH DAILY Patient not taking: Reported on 11/02/2022 04/05/22   Dana Allan, MD  Multiple Vitamins-Minerals (MULTIVITAMIN WITH MINERALS) tablet Take 1 tablet by mouth daily.    [provider]  Na Sulfate-K Sulfate-Mg Sulf 17.5-3.13-1.6 GM/177ML SOLN Take by mouth. 10/21/22   [provider]    Allergies as of 10/12/2022   (No Known Allergies)    Family History  Problem Relation Age of Onset   Hypertension Mother    Hyperlipidemia Mother    Early death Mother    Drug abuse Mother    Alcohol abuse Mother    Coronary artery disease Mother    Throat cancer Father    Esophageal cancer Father        dx late 50s, d. 69   Diabetes Sister    Depression Sister    Prostate cancer Brother 22   Colon cancer Neg Hx    Rectal cancer Neg  Hx    Stomach cancer Neg Hx     Social History   Socioeconomic History   Marital status: Married    Spouse name: Not on file   Number of children: 2   Years of education: Not on file   Highest education level: Not on file  Occupational History   Occupation: Tel Psychologist, prison and probation services: [employer]  Tobacco Use   Smoking status: Every Day    Packs/day: 1.50    Years: 50.00    Additional pack years: 0.00    Total pack years: 75.00    Types: Cigarettes    Start date: 03/13/2012   Smokeless tobacco: Never  Vaping Use   Vaping Use: Never used  Substance and Sexual Activity   Alcohol use: Yes    Comment: occasionally   Drug use: No   Sexual activity:  Not on file  Other Topics Concern   Not on file  Social History Narrative   Not on file   Social Determinants of Health   Financial Resource Strain: Low Risk  (06/09/2022)   Overall Financial Resource Strain (CARDIA)    Difficulty of Paying Living Expenses: Not hard at all  Food Insecurity: No Food Insecurity (06/09/2022)   Hunger Vital Sign    Worried About Running Out of Food in the Last Year: Never true    Ran Out of Food in the Last Year: Never true  Transportation Needs: No Transportation Needs (06/09/2022)   PRAPARE - Administrator, Civil Service (Medical): No    Lack of Transportation (Non-Medical): No  Physical Activity: Unknown (06/09/2022)   Exercise Vital Sign    Days of Exercise per Week: 0 days    Minutes of Exercise per Session: Not on file  Stress: No Stress Concern Present (06/09/2022)   [employer] of Occupational Health - Occupational Stress Questionnaire    Feeling of Stress : Not at all  Social Connections: Unknown (06/09/2022)   Social Connection and Isolation Panel [NHANES]    Frequency of Communication with Friends and Family: Not on file    Frequency of Social Gatherings with Friends and Family: Not on file    Attends Religious Services: Not on file    Active Member of Clubs  or Organizations: Not on file    Attends Banker Meetings: Not on file    Marital Status: Married  Intimate Partner Violence: Not At Risk (06/09/2022)   Humiliation, Afraid, Rape, and Kick questionnaire    Fear of Current or Ex-Partner: No    Emotionally Abused: No    Physically Abused: No    Sexually Abused: No    Review of Systems: See HPI, otherwise negative ROS  Physical Exam: BP 126/85   Pulse 80   Temp (!) 97 F (36.1 C) (Temporal)   Resp 18   Ht 5\' 11"  (1.803 m)   Wt 117 kg   SpO2 97%   BMI 35.98 kg/m  General:   Alert,  pleasant and cooperative in NAD Head:  Normocephalic and atraumatic. Neck:  Supple; no masses or thyromegaly. Lungs:  Clear throughout to auscultation, normal respiratory effort.    Heart:  +S1, +S2, Regular rate and rhythm, No edema. Abdomen:  Soft, nontender and nondistended. Normal bowel sounds, without guarding, and without rebound.   Neurologic:  Alert and  oriented x4;  grossly normal neurologically.  Impression/Plan: James Lee is here for an colonoscopy to be performed for surveillance due to prior history of colon polyps   Risks, benefits, limitations, and alternatives regarding  colonoscopy have been reviewed with the patient.  Questions have been answered.  All parties agreeable.   Wyline Mood, MD  11/02/2022, 8:06 AM

## 2022-11-02 NOTE — Anesthesia Procedure Notes (Addendum)
Procedure Name: General with mask airway Date/Time: 11/02/2022 8:14 AM  Performed by: Lily Lovings, CRNAPre-anesthesia Checklist: Patient identified, Emergency Drugs available, Patient being monitored, Suction available and Timeout performed Patient Re-evaluated:Patient Re-evaluated prior to induction Oxygen Delivery Method: Simple face mask Induction Type: IV induction Airway Equipment and Method: Oral airway

## 2022-11-02 NOTE — Transfer of Care (Signed)
Immediate Anesthesia Transfer of Care Note  Patient: James Lee  Procedure(s) Performed: COLONOSCOPY WITH PROPOFOL  Patient Location: Endoscopy Unit  Anesthesia Type:General  Level of Consciousness: drowsy  Airway & Oxygen Therapy: Patient Spontanous Breathing and Patient connected to face mask oxygen  Post-op Assessment: Report given to RN and Post -op Vital signs reviewed and stable  Post vital signs: Reviewed and stable  Last Vitals:  Vitals Value Taken Time  BP    Temp    Pulse    Resp    SpO2      Last Pain:  Vitals:   11/02/22 0839  TempSrc:   PainSc: Asleep         Complications: No notable events documented.

## 2022-11-03 ENCOUNTER — Ambulatory Visit: Payer: Medicare Other | Admitting: Podiatry

## 2022-11-03 ENCOUNTER — Encounter: Payer: Self-pay | Admitting: Gastroenterology

## 2022-11-03 DIAGNOSIS — E1142 Type 2 diabetes mellitus with diabetic polyneuropathy: Secondary | ICD-10-CM

## 2022-11-03 DIAGNOSIS — M2030 Hallux varus (acquired), unspecified foot: Secondary | ICD-10-CM | POA: Diagnosis not present

## 2022-11-03 DIAGNOSIS — M79675 Pain in left toe(s): Secondary | ICD-10-CM | POA: Diagnosis not present

## 2022-11-03 DIAGNOSIS — M79674 Pain in right toe(s): Secondary | ICD-10-CM | POA: Diagnosis not present

## 2022-11-03 DIAGNOSIS — B351 Tinea unguium: Secondary | ICD-10-CM

## 2022-11-04 NOTE — Telephone Encounter (Signed)
yes

## 2022-11-07 DIAGNOSIS — L989 Disorder of the skin and subcutaneous tissue, unspecified: Secondary | ICD-10-CM | POA: Insufficient documentation

## 2022-11-07 NOTE — Assessment & Plan Note (Addendum)
Chronic.  Stable. -Refill Farxiga 10 mg daily -Increase Rybelsus to 14 mg daily -Refill Metformin 1000 mg  BID -Discontinue glipizide 5 mg daily secondary to hypoglycemic events -Continue Crestor 10 mg daily -Diabetic eye exam completed. -Follow-up with nephrology as scheduled -Follow up in 3 months

## 2022-11-07 NOTE — Assessment & Plan Note (Signed)
Chronic.  BMI with serious comorbidities hypertension, DM type II, hyperlipidemia. Increase Rybelsus to 14 mg daily Continue portion control meals and healthy lifestyle choices.

## 2022-11-07 NOTE — Assessment & Plan Note (Signed)
Chronic.  Stable.  Initially elevated 132/76.  Repeat at goal goal <130/80.  -Continue Amlodipine 5 mg daily -Continue Benicar 40 mg daily -Continue Chlorthalidone 50 mg daily -Continue Metoprolol 50 mg daily -Continue to monitor BP at home  -Follows with nephrology and cardiology.   

## 2022-11-07 NOTE — Assessment & Plan Note (Signed)
New problem.  Left outer portion of nasal bridge.  History of outdoor work. Will refer to dermatology for further evaluation.

## 2022-11-07 NOTE — Progress Notes (Signed)
  Subjective:  Patient ID: Antonius Strasburger, male    DOB: Feb 09, 1951,  MRN: [id number]  Dowell Giangrande presents to clinic today for painful elongated mycotic toenails 1-5 bilaterally which are tender when wearing enclosed shoe gear. Pain is relieved with periodic professional debridement.  Chief Complaint  Patient presents with   Nail Problem    DFC,Referring Provider Dana Allan, MD,LOV:05/24,A1C:7.0,B/S:119      New problem(s): None.   PCP is Dana Allan, MD.  No Known Allergies  Review of Systems: Negative except as noted in the HPI.  Objective: No changes noted in today's physical examination. There were no vitals filed for this visit. Olliver Vastine is a pleasant 72 y.o. male obese in NAD. AAO x 3.  Vascular Examination: Capillary refill time immediate b/l. Vascular status intact b/l with palpable pedal pulses. Pedal hair sparse b/l. No pain with calf compression b/l. Skin temperature gradient WNL b/l. No cyanosis or clubbing b/l. No ischemia or gangrene noted b/l.   Neurological Examination: Sensation grossly intact b/l with 10 gram monofilament. Vibratory sensation intact b/l. Pt has subjective symptoms of neuropathy.  Dermatological Examination: Pedal skin with normal turgor, texture and tone b/l.  No open wounds. No interdigital macerations.   Toenails 1-5 b/l thick, discolored, elongated with subungual debris and pain on dorsal palpation.   No hyperkeratotic nor porokeratotic lesions present on today's visit.  Musculoskeletal Examination: Muscle strength 5/5 to all lower extremity muscle groups bilaterally. Hallux malleus noted b/l.  Radiographs: None  Last A1c:      Latest Ref Rng & Units 10/24/2022    5:22 PM 07/13/2022    9:14 AM 02/24/2022   10:48 AM 11/11/2021    6:22 AM  Hemoglobin A1C  Hemoglobin-A1c 4.0 - 5.6 % 7.0  7.6  8.2  7.0    Assessment/Plan: 1. Pain due to onychomycosis of toenails of both feet   2. Hallux malleus, unspecified laterality   3. Diabetic  polyneuropathy associated with type 2 diabetes mellitus (HCC)     Orders Placed This Encounter  Procedures   For Home Use Only DME Diabetic Shoe    Dispense one pair extra depth shoes with stretchable uppers and 3 pair total contact insoles.   FOR HOME USE ONLY DME DIABETIC SHOE -Patient was evaluated and treated. All patient's and/or POA's questions/concerns answered on today's visit. -Consent given for treatment as described below: -Examined patient. -Discussed diabetic shoe benefit available based on patient's diagnoses. Patient/POA would like to proceed. Order entered for one pair extra depth shoes and 3 pair total contact insoles. Patient qualifies based on diagnoses. -Patient/POA to call should there be question/concern in the interim.   Return in about 3 months (around 02/03/2023).  Freddie Breech, DPM

## 2022-11-08 ENCOUNTER — Ambulatory Visit: Payer: Medicare Other | Attending: Cardiology

## 2022-11-08 DIAGNOSIS — I2510 Atherosclerotic heart disease of native coronary artery without angina pectoris: Secondary | ICD-10-CM

## 2022-11-09 ENCOUNTER — Telehealth: Payer: Self-pay | Admitting: Family Medicine

## 2022-11-09 DIAGNOSIS — L989 Disorder of the skin and subcutaneous tissue, unspecified: Secondary | ICD-10-CM

## 2022-11-09 NOTE — Telephone Encounter (Signed)
Referral placed per LOV note (had not been done).  Message sent via MyChart with contact information for [facility].

## 2022-11-09 NOTE — Telephone Encounter (Signed)
Pt called in stating that as per his last visit with Dr. Clent Ridges she was going to put a referral for Dermatology. As per pt, he did not hear anything as of yet regarding that referral.?

## 2022-11-11 LAB — ECHOCARDIOGRAM COMPLETE
AR max vel: 4.03 cm2
AV Area VTI: 3.98 cm2
AV Area mean vel: 3.79 cm2
AV Mean grad: 3 mmHg
AV Peak grad: 5.2 mmHg
Ao pk vel: 1.15 m/s
Area-P 1/2: 3.72 cm2
Calc EF: 54.5 %
S' Lateral: 3.4 cm
Single Plane A2C EF: 53.9 %
Single Plane A4C EF: 52.8 %

## 2022-11-16 ENCOUNTER — Encounter: Payer: Self-pay | Admitting: Cardiology

## 2022-11-16 ENCOUNTER — Ambulatory Visit: Payer: Medicare Other | Attending: Cardiology | Admitting: Cardiology

## 2022-11-16 NOTE — Progress Notes (Signed)
This encounter was created in error - please disregard.

## 2022-11-17 ENCOUNTER — Other Ambulatory Visit: Payer: Self-pay

## 2022-11-17 MED ORDER — METOPROLOL SUCCINATE ER 50 MG PO TB24: 50.0000 mg | ORAL_TABLET | Freq: Every day | ORAL | 0 refills | Status: DC

## 2022-11-17 NOTE — Telephone Encounter (Signed)
Requested Prescriptions   Signed Prescriptions Disp Refills   metoprolol succinate (TOPROL-XL) 50 MG 24 hr tablet 30 tablet 0    Sig: Take 1 tablet (50 mg total) by mouth daily.    Authorizing Provider: Debbe Odea    Ordering User: Guerry Minors

## 2022-11-18 ENCOUNTER — Telehealth: Payer: Self-pay

## 2022-11-18 NOTE — Telephone Encounter (Signed)
Called and informed pt that his patient assistance meds are available for pick up    Medication: Rybelsus 14 mg  Sig: take 1 tablet daily  QTY: 4 boxes    2 bags have been labeled and placed in designated pick up area

## 2022-11-22 NOTE — Telephone Encounter (Signed)
Patient picked up Patient Assistance Rybelsus

## 2022-11-22 NOTE — Telephone Encounter (Signed)
Patient picked up 2 bags of Patient assistance Rybelsus

## 2022-11-23 DIAGNOSIS — G4733 Obstructive sleep apnea (adult) (pediatric): Secondary | ICD-10-CM | POA: Diagnosis not present

## 2022-11-28 DIAGNOSIS — E1122 Type 2 diabetes mellitus with diabetic chronic kidney disease: Secondary | ICD-10-CM | POA: Diagnosis not present

## 2022-11-30 DIAGNOSIS — K080 Exfoliation of teeth due to systemic causes: Secondary | ICD-10-CM | POA: Diagnosis not present

## 2022-12-05 ENCOUNTER — Ambulatory Visit: Payer: Medicare Other | Admitting: Cardiology

## 2022-12-15 NOTE — Progress Notes (Signed)
Office Visit    Patient Name: James Lee Date of Encounter: 12/16/2022  Primary Care Provider:  Dana Allan, MD Primary Cardiologist:  Debbe Odea, MD  Chief Complaint  73 year old male with past medical history of coronary artery disease per CT scan, hypertension, hyperlipidemia, diabetes, current smoker for over 40 years, COPD and OSA. He presents today for follow up regarding his CAD.   Past Medical History    Past Medical History:  Diagnosis Date   Bacterial conjunctivitis of both eyes 02/27/2022   Benign essential HTN 09/02/2014   Bronchitis 03/14/2022   Cancer of prostate (HCC) 03/08/2011   Colon cancer screening 05/04/2022   Colon polyps    Coronary artery calcification seen on CAT scan    Coronary artery calcification seen on CAT scan 09/02/2014   Diabetes mellitus without complication (HCC)    Dyslipidemia    Elevated serum creatinine 11/11/2021   Gastric ulcer 11/29/2017   GERD (gastroesophageal reflux disease)    History of colonic polyps 05/03/2022   Hyperlipidemia    Hypertension    Lactic acidosis 11/11/2021   LBP (low back pain)    Male erectile dysfunction, unspecified 06/21/2011   NIDDM-2 with hyperglycemia 11/11/2021   Perforated bowel (HCC) 11/29/2017   Personal history of prostate cancer 08/16/2012   Prostate cancer (HCC) 2014   prostate    RBBB    RBBB 04/27/2021   Reduced libido 08/16/2012   Scrotal pain 08/16/2012   Sleep apnea    c-pap wears   Syncope 11/10/2021   Past Surgical History:  Procedure Laterality Date   ABDOMINAL SURGERY     gastric ulcer   back sugery     1996, 2017, 2018   BACK SURGERY  1996   2017, 2018, 2019   CATARACT EXTRACTION Bilateral 2024   CATARACT EXTRACTION W/PHACO Left 09/14/2022   Procedure: CATARACT EXTRACTION PHACO AND INTRAOCULAR LENS PLACEMENT (IOC) LEFT DIABETIC 16.26 01:17.3;  Surgeon: Lockie Mola, MD;  Location: [facility];  Service: Ophthalmology;  Laterality: Left;    CATARACT EXTRACTION W/PHACO Right 09/28/2022   Procedure: CATARACT EXTRACTION PHACO AND INTRAOCULAR LENS PLACEMENT (IOC) RIGHT DIABETIC  9.93  00:49.4;  Surgeon: Lockie Mola, MD;  Location: [facility];  Service: Ophthalmology;  Laterality: Right;  Diabetic   COLONOSCOPY WITH PROPOFOL N/A 05/03/2022   Procedure: COLONOSCOPY WITH PROPOFOL;  Surgeon: Wyline Mood, MD;  Location: [facility] ENDOSCOPY;  Service: Gastroenterology;  Laterality: N/A;   COLONOSCOPY WITH PROPOFOL N/A 05/04/2022   Procedure: COLONOSCOPY WITH PROPOFOL;  Surgeon: Wyline Mood, MD;  Location: [facility] ENDOSCOPY;  Service: Gastroenterology;  Laterality: N/A;   COLONOSCOPY WITH PROPOFOL N/A 11/02/2022   Procedure: COLONOSCOPY WITH PROPOFOL;  Surgeon: Wyline Mood, MD;  Location: [facility] ENDOSCOPY;  Service: Gastroenterology;  Laterality: N/A;   gastric ulcers     surgery 11-28-17   INGUINAL HERNIA REPAIR     right   LAPAROSCOPY N/A 11/28/2017   Procedure: LAPAROSCOPY DIAGNOSTIC converted to open;  Surgeon: Axel Filler, MD;  Location: [facility] OR;  Service: General;  Laterality: N/A;   LAPAROTOMY N/A 11/28/2017   Procedure: EXPLORATORY LAPAROTOMY;  Surgeon: Axel Filler, MD;  Location: [facility] OR;  Service: General;  Laterality: N/A;   MENISCUS REPAIR Right    POSTERIOR CERVICAL FUSION/FORAMINOTOMY N/A 11/10/2017   Procedure: Posterior cervical fusion with lateral mass fixation Cervical Three-Seven, cervical laminectomy Cervical Three- Seven;  Surgeon: Tia Alert, MD;  Location: [facility] OR;  Service: Neurosurgery;  Laterality: N/A;  Posterior cervical fusion with lateral mass fixation  Cervical Three-Seven, cervical laminectomy Cervical Three- Seven   PROSTATECTOMY  01/2012   TONSILLECTOMY     TOOTH EXTRACTION     Allergies No Known Allergies Labs/Other Studies Reviewed    The following studies were reviewed today: Cardiac Studies & Procedures     STRESS TESTS  MYOCARDIAL PERFUSION IMAGING 07/27/2021  Narrative   The  study is normal. The study is low risk.   No ST deviation was noted.   LV perfusion is normal.   Left ventricular function is normal. Nuclear stress EF: 56 %. The left ventricular ejection fraction is normal (55-65%). End diastolic cavity size is normal. End systolic cavity size is normal.  Normal resting and stress perfusion. No ischemia or infarction EF 56%   ECHOCARDIOGRAM  ECHOCARDIOGRAM COMPLETE 11/11/2022  Narrative ECHOCARDIOGRAM REPORT    Patient Name:   James Lee Date of Exam: 11/08/2022 Medical Rec #:  [id number]  Height:       71.0 in Accession #:    [id number] Weight:       258.0 lb Date of Birth:  September 12, 1950 BSA:          2.350 m Patient Age:    71 years   BP:           130/72 mmHg Patient Gender: M          HR:           70 bpm. Exam Location:  [place]  Procedure: 2D Echo, Cardiac Doppler, Color Doppler and Strain Analysis  Indications:    R06.02 SOB  History:        Patient has no prior history of Echocardiogram examinations. CAD, COPD, Signs/Symptoms:Shortness of Breath; Risk Factors:Hypertension, Diabetes, Sleep Apnea, Current Smoker and Dyslipidemia.  Sonographer:    Quentin Ore RDMS, RVT, RDCS Referring Phys: [id number] BRIAN AGBOR-ETANG  IMPRESSIONS   1. Left ventricular ejection fraction, by estimation, is 55 to 60%. Left ventricular ejection fraction by 2D MOD biplane is 54.5 %. The left ventricle has normal function. The left ventricle has no regional wall motion abnormalities. Left ventricular diastolic parameters are consistent with Grade I diastolic dysfunction (impaired relaxation). The average left ventricular global longitudinal strain is -19.6 %. The global longitudinal strain is normal. 2. Right ventricular systolic function is normal. The right ventricular size is normal. 3. The mitral valve is normal in structure. No evidence of mitral valve regurgitation. 4. The aortic valve was not well visualized. Aortic valve regurgitation is not  visualized. 5. Aortic dilatation noted. There is mild dilatation of the aortic root, measuring 39 mm.  FINDINGS Left Ventricle: Left ventricular ejection fraction, by estimation, is 55 to 60%. Left ventricular ejection fraction by 2D MOD biplane is 54.5 %. The left ventricle has normal function. The left ventricle has no regional wall motion abnormalities. The average left ventricular global longitudinal strain is -19.6 %. The global longitudinal strain is normal. The left ventricular internal cavity size was normal in size. There is no left ventricular hypertrophy. Left ventricular diastolic parameters are consistent with Grade I diastolic dysfunction (impaired relaxation).  Right Ventricle: The right ventricular size is normal. No increase in right ventricular wall thickness. Right ventricular systolic function is normal.  Left Atrium: Left atrial size was normal in size.  Right Atrium: Right atrial size was normal in size.  Pericardium: There is no evidence of pericardial effusion.  Mitral Valve: The mitral valve is normal in structure. No evidence of mitral valve regurgitation.  Tricuspid Valve: The tricuspid valve is normal  in structure. Tricuspid valve regurgitation is not demonstrated.  Aortic Valve: The aortic valve was not well visualized. Aortic valve regurgitation is not visualized. Aortic valve mean gradient measures 3.0 mmHg. Aortic valve peak gradient measures 5.2 mmHg. Aortic valve area, by VTI measures 3.98 cm.  Pulmonic Valve: The pulmonic valve was not well visualized. Pulmonic valve regurgitation is not visualized.  Aorta: Aortic dilatation noted. There is mild dilatation of the aortic root, measuring 39 mm.  IAS/Shunts: No atrial level shunt detected by color flow Doppler.   LEFT VENTRICLE PLAX 2D                        Biplane EF (MOD) LVIDd:         5.30 cm         LV Biplane EF:   Left LVIDs:         3.40 cm                          ventricular LV PW:          0.80 cm                          ejection LV IVS:        1.10 cm                          fraction by LVOT diam:     2.30 cm                          2D MOD LV SV:         101                              biplane is LV SV Index:   43                               54.5 %. LVOT Area:     4.15 cm Diastology LV e' medial:    9.16 cm/s LV Volumes (MOD)               LV E/e' medial:  10.1 LV vol d, MOD    117.0 ml      LV e' lateral:   11.80 cm/s A2C:                           LV E/e' lateral: 7.8 LV vol d, MOD    151.0 ml A4C:                           2D LV vol s, MOD    53.9 ml       Longitudinal A2C:                           Strain LV vol s, MOD    71.2 ml       2D Strain GLS  -19.6 % A4C:                           Avg: LV SV MOD A2C:  63.1 ml LV SV MOD A4C:   151.0 ml LV SV MOD BP:    74.8 ml  RIGHT VENTRICLE             IVC RV Basal diam:  5.00 cm     IVC diam: 1.40 cm RV Mid diam:    3.80 cm RV S prime:     13.10 cm/s TAPSE (M-mode): 3.1 cm  LEFT ATRIUM             Index        RIGHT ATRIUM           Index LA diam:        3.90 cm 1.66 cm/m   RA Area:     18.30 cm LA Vol (A2C):   52.9 ml 22.51 ml/m  RA Volume:   51.00 ml  21.71 ml/m LA Vol (A4C):   63.6 ml 27.07 ml/m LA Biplane Vol: 60.2 ml 25.62 ml/m AORTIC VALVE                    PULMONIC VALVE AV Area (Vmax):    4.03 cm     PV Vmax:       1.06 m/s AV Area (Vmean):   3.79 cm     PV Peak grad:  4.5 mmHg AV Area (VTI):     3.98 cm AV Vmax:           114.50 cm/s AV Vmean:          77.000 cm/s AV VTI:            0.255 m AV Peak Grad:      5.2 mmHg AV Mean Grad:      3.0 mmHg LVOT Vmax:         111.00 cm/s LVOT Vmean:        70.200 cm/s LVOT VTI:          0.244 m LVOT/AV VTI ratio: 0.96  AORTA Ao Root diam: 3.90 cm Ao Asc diam:  3.60 cm Ao Arch diam: 2.7 cm  MITRAL VALVE MV Area (PHT): 3.72 cm     SHUNTS MV Decel Time: 204 msec     Systemic VTI:  0.24 m MV E velocity: 92.20 cm/s   Systemic Diam: 2.30  cm MV A velocity: 112.00 cm/s MV E/A ratio:  0.82  Debbe Odea MD Electronically signed by Debbe Odea MD Signature Date/Time: 11/11/2022/1:05:58 PM    Final     CT SCANS  CT CARDIAC SCORING (SELF PAY ONLY) 07/01/2021  Addendum 07/01/2021  4:03 PM ADDENDUM REPORT: 07/01/2021 16:01  CLINICAL DATA:  Cardiovascular Disease Risk stratification  EXAM: Coronary Calcium Score  TECHNIQUE: A gated, non-contrast computed tomography scan of the heart was performed using 3mm slice thickness. Axial images were analyzed on a dedicated workstation. Calcium scoring of the coronary arteries was performed using the Agatston method.  FINDINGS: Coronary arteries: Normal origins.  Coronary Calcium Score:  Left main: 0  Left anterior descending artery: 62  Left circumflex artery: 18  Right coronary artery: 561  Total: 641  Percentile: 81  Pericardium: Normal.  Aorta: Borderline normal to slightly enlarged caliber of ascending aorta. No aortic atherosclerosis noted.  Non-cardiac: See separate report from [facility].  IMPRESSION: Coronary calcium score of 641. This was 81st percentile for age-, race-, and sex-matched controls.  RECOMMENDATIONS: Coronary artery calcium (CAC) score is a strong predictor of incident coronary heart disease (CHD) and provides predictive information beyond traditional risk factors.  CAC scoring is reasonable to use in the decision to withhold, postpone, or initiate statin therapy in intermediate-risk or selected borderline-risk asymptomatic adults (age 46-75 years and LDL-C >=70 to <190 mg/dL) who do not have diabetes or established atherosclerotic cardiovascular disease (ASCVD).* In intermediate-risk (10-year ASCVD risk >=7.5% to <20%) adults or selected borderline-risk (10-year ASCVD risk >=5% to <7.5%) adults in whom a CAC score is measured for the purpose of making a treatment decision the following recommendations have  been made:  If CAC=0, it is reasonable to withhold statin therapy and reassess in 5 to 10 years, as long as higher risk conditions are absent (diabetes mellitus, family history of premature CHD in first degree relatives (males <55 years; females <65 years), cigarette smoking, or LDL >=190 mg/dL).  If CAC is 1 to 99, it is reasonable to initiate statin therapy for patients >=51 years of age.  If CAC is >=100 or >=75th percentile, it is reasonable to initiate statin therapy at any age.  Cardiology referral should be considered for patients with CAC scores >=400 or >=75th percentile.  *2018 AHA/ACC/AACVPR/AAPA/ABC/ACPM/ADA/AGS/APhA/ASPC/NLA/PCNA Guideline on the Management of Blood Cholesterol: A Report of the American College of Cardiology/American Heart Association Task Force on Clinical Practice Guidelines. J Am Coll Cardiol. 2019;73(24):3168-3209.  Jodelle Red, MD   Electronically Signed By: Jodelle Red M.D. On: 07/01/2021 16:01  Narrative EXAM: OVER-READ INTERPRETATION  CT CHEST  The following report is an over-read performed by radiologist Dr. Irish Lack of [facility] on 07/01/2021. This over-read does not include interpretation of cardiac or coronary anatomy or pathology. The coronary calcium score interpretation by the cardiologist is attached.  COMPARISON:  CT of the chest without contrast 01/28/2015  FINDINGS: Vascular: No significant noncardiac vascular findings.  Mediastinum/Nodes: Visualized mediastinum and hilar regions demonstrate no lymphadenopathy or masses.  Lungs/Pleura: Visualized lungs show no evidence of pulmonary edema, consolidation, pneumothorax, nodule or pleural fluid.  Upper Abdomen: No acute abnormality.  Musculoskeletal: No chest wall mass or suspicious bone lesions identified.  IMPRESSION: No significant incidental findings.  Electronically Signed: By: Irish Lack M.D. On: 07/01/2021  13:49         Recent Labs: 02/24/2022: ALT 18; BUN 27; Creatinine, Ser 1.80; Hemoglobin 15.1; Platelets 228.0; Potassium 5.1; Sodium 143; TSH 5.42  Recent Lipid Panel    Component Value Date/Time   CHOL 141 02/24/2022 1048   CHOL 142 04/27/2021 0925   TRIG 342.0 (H) 02/24/2022 1048   HDL 28.90 (L) 02/24/2022 1048   HDL 30 (L) 04/27/2021 0925   CHOLHDL 5 02/24/2022 1048   VLDL 68.4 (H) 02/24/2022 1048   LDLCALC 75 04/27/2021 0925   LDLDIRECT 80.0 02/24/2022 1048    History of Present Illness  72 year old male with past medical history of coronary artery disease per CT scan, hypertension, hyperlipidemia, diabetes, current smoker for over 40 years, COPD and OSA.   Previously seen by Dr. Mayford Knife in [place] for OSA and CAD that had been noted on CT scans going back to 2013, with a normal nuclear stress test in 2012.He was last seen by Dr. Mayford Knife in 04/2021, he had a Lexiscan 07/2021 that showed no ischemia, EF 55%.   He was seen on 10/07/2022 by Dr.Agbor-Etang.  At that time he was doing well from a cardiac perspective.  He endorsed redness and pain on bilateral feet secondary to diabetic neuropathy.  He had a chest CT lung cancer screening 06/2022 that showed LAD and RCA calcifications. On 11/09/22 he had an echocardiogram that  indicated an EF of 55 to 60%, LV with normal function and no RWMA. There was mild dilatation of the aortic root measuring 39mm.   Today he reports he is doing well, he has no cardiac concerns. He denies chest pain. Notes some dyspnea with exertion which he relates to his COPD, feels this at his baseline. Reports he has not taken his blood pressure medications this morning as he was in a hurry to get to his appointment, home blood pressures average 134/80. Notes he is unable to exercise frequently due to mobility problems, notes peripheral neuropathy.   Home Medications    Current Outpatient Medications  Medication Sig Dispense Refill   amLODipine (NORVASC) 5 MG tablet  Take 5 mg by mouth daily.     aspirin 81 MG tablet Take 1 tablet (81 mg total) by mouth daily. 30 tablet 0   chlorthalidone (HYGROTON) 50 MG tablet Take 50 mg by mouth daily.     Continuous Blood Gluc Sensor (FREESTYLE LIBRE 2 SENSOR) MISC Place 1 sensor to skin every 14 days 1 each 5   docusate sodium (COLACE) 100 MG capsule Take 100 mg by mouth daily.     FARXIGA 10 MG TABS tablet Take 1 tablet (10 mg total) by mouth daily. 90 tablet 3   Fluticasone-Umeclidin-Vilant (TRELEGY ELLIPTA) 200-62.5-25 MCG/ACT AEPB Inhale 1 Act into the lungs daily. 1 each 5   gabapentin (NEURONTIN) 300 MG capsule Take 600 mg by mouth 2 (two) times daily.  1   metFORMIN (GLUCOPHAGE-XR) 500 MG 24 hr tablet Take 2 tablets (1,000 mg total) by mouth 2 (two) times daily with a meal. 360 tablet 3   Multiple Vitamins-Minerals (MULTIVITAMIN WITH MINERALS) tablet Take 1 tablet by mouth daily.     olmesartan (BENICAR) 40 MG tablet Take 40 mg by mouth daily.     rosuvastatin (CRESTOR) 10 MG tablet Take 1 tablet (10 mg total) by mouth daily. 30 tablet 2   Semaglutide (RYBELSUS) 14 MG TABS Take 1 tablet (14 mg total) by mouth daily. 30 tablet 12   metoprolol succinate (TOPROL-XL) 50 MG 24 hr tablet Take 1 tablet (50 mg total) by mouth daily. 90 tablet 0   No current facility-administered medications for this visit.    Review of Systems   He denies chest pain, palpitations, pnd, orthopnea, n, v, dizziness, syncope, edema, weight gain, or early satiety. All other systems reviewed and are otherwise negative except as noted above.     Physical Exam    VS:  BP (!) 152/84   Pulse 75   Ht 5\' 11"  (1.803 m)   Wt 259 lb 6.4 oz (117.7 kg)   SpO2 95%   BMI 36.18 kg/m  , BMI Body mass index is 36.18 kg/m.     GEN: Well nourished, well developed, in no acute distress. HEENT: normal. Neck: Supple, no JVD, carotid bruits, or masses. Cardiac: RRR, no murmurs, rubs, or gallops. No clubbing, cyanosis, edema.  Radials/DP/PT 2+ and equal  bilaterally.  Respiratory:  Respirations regular and unlabored, clear to auscultation bilaterally. GI: Soft, nontender, nondistended, BS + x 4. MS: no deformity or atrophy. Skin: warm and dry, no rash. Neuro:  Strength and sensation are intact. Psych: Normal affect.  Accessory Clinical Findings    No EKG ordered today.   Lab Results  Component Value Date   WBC 10.1 02/24/2022   HGB 15.1 02/24/2022   HCT 45.3 02/24/2022   MCV 94.3 02/24/2022   PLT 228.0 02/24/2022   Lab  Results  Component Value Date   CREATININE 1.80 (H) 02/24/2022   BUN 27 (H) 02/24/2022   NA 143 02/24/2022   K 5.1 02/24/2022   CL 105 02/24/2022   CO2 29 02/24/2022   Lab Results  Component Value Date   ALT 18 02/24/2022   AST 16 02/24/2022   ALKPHOS 63 02/24/2022   BILITOT 0.5 02/24/2022   Lab Results  Component Value Date   CHOL 141 02/24/2022   HDL 28.90 (L) 02/24/2022   LDLCALC 75 04/27/2021   LDLDIRECT 80.0 02/24/2022   TRIG 342.0 (H) 02/24/2022   CHOLHDL 5 02/24/2022    Lab Results  Component Value Date   HGBA1C 7.0 (A) 10/24/2022   Assessment & Plan    CAD: Noted to have LAD and RCA calcifications on CT scans. Lexiscan 07/2021 indicated no ischemia. Stable with no anginal symptoms. No indication for ischemic evaluation.  Heart healthy diet and regular cardiovascular exercise encouraged.  Check CBC, Cmet and fasting lipid panel. Continue aspirin, amlodipine, chlorthalidone, farxiga, metoprolol succinate, olmesartan, and rosuvastatin.  HTN: Blood pressure elevated today, initial 162/84, recheck 152/84. Reports he did not take his blood pressure medications this morning as he was running late. Confirmed he will take his medications when he gets home, he has no symptoms of hypertension. Continue amlodipine, chlorthalidone, farxiga, metoprolol succinate, olmesartan.   HLD: Last lipid panel on 02/24/22 indicated total cholesterol of 141, triglycerides of 342 and LDL of 80. Check Cmet and fasting  lipid panel next week as he does not have time to have checked today. Continue rosuvastatin 10mg  daily, pending lipid panel results.   OSA: He reports CPAP compliance  Current tobacco use: Currently smoke 1.5 packs a day. Discussed and recommended cessation, he is not interested at this time.   Aortic root dilatation: Noted to have mild aortic root dilatation at 39mm on echo from 11/09/22.   Disposition: Follow up with Eula Listen, PA in 6 months.  HYPERTENSION CONTROL Vitals:   12/16/22 1000 12/16/22 1023  BP: (!) 162/84 (!) 152/84    The patient's blood pressure is elevated above target today.  In order to address the patient's elevated BP: He did not take his morning medications, he will take once he returns home.     Rip Harbour, NP 12/16/2022, 5:09 PM

## 2022-12-16 ENCOUNTER — Ambulatory Visit: Payer: Medicare Other | Attending: Cardiology | Admitting: Cardiology

## 2022-12-16 ENCOUNTER — Encounter: Payer: Self-pay | Admitting: Physician Assistant

## 2022-12-16 VITALS — BP 152/84 | HR 75 | Ht 71.0 in | Wt 259.4 lb

## 2022-12-16 DIAGNOSIS — I10 Essential (primary) hypertension: Secondary | ICD-10-CM | POA: Diagnosis not present

## 2022-12-16 DIAGNOSIS — I2510 Atherosclerotic heart disease of native coronary artery without angina pectoris: Secondary | ICD-10-CM

## 2022-12-16 DIAGNOSIS — E782 Mixed hyperlipidemia: Secondary | ICD-10-CM

## 2022-12-16 DIAGNOSIS — I77810 Thoracic aortic ectasia: Secondary | ICD-10-CM

## 2022-12-16 DIAGNOSIS — F17200 Nicotine dependence, unspecified, uncomplicated: Secondary | ICD-10-CM | POA: Diagnosis not present

## 2022-12-16 DIAGNOSIS — Z79899 Other long term (current) drug therapy: Secondary | ICD-10-CM

## 2022-12-16 DIAGNOSIS — G4733 Obstructive sleep apnea (adult) (pediatric): Secondary | ICD-10-CM

## 2022-12-16 MED ORDER — METOPROLOL SUCCINATE ER 50 MG PO TB24: 50.0000 mg | ORAL_TABLET | Freq: Every day | ORAL | 0 refills | Status: DC

## 2022-12-16 NOTE — Progress Notes (Signed)
Patient did not take his morning blood pressure medications, will take once he is home.

## 2022-12-16 NOTE — Patient Instructions (Signed)
Medication Instructions:  Your Physician recommend you continue on your current medication as directed.    *If you need a refill on your cardiac medications before your next appointment, please call your pharmacy*   Lab Work: Your provider would like for you to return to have the following labs drawn: Lipid, CBC and CMET.  You may also go to any of these [facility] locations:   [facility] - [address] ([facility]) - [phone] [address]   [place] - [address]    [place]  - [address]    [place] - [address] - [address] [employer]  - [address] [place] - [address] ([facility]  If you have labs (blood work) drawn today and your tests are completely normal, you will receive your results only by: [employer] (if you have MyChart) OR A paper copy in the mail If you have any lab test that is abnormal or we need to change your treatment, we will call you to review the results.   Testing/Procedures: None ordered today.    Follow-Up: At [facility], you and your health needs are our priority.  As part of our continuing mission to provide you with exceptional heart care, we have created designated Provider Care Teams.  These Care Teams include your primary Cardiologist (physician) and Advanced Practice Providers (APPs -  Physician Assistants and Nurse Practitioners) who all work together to provide you with the care you need, when you need it.  We recommend signing up for the patient portal called "MyChart".  Sign up information is provided on this After Visit Summary.  MyChart is used to connect with patients for Virtual Visits (Telemedicine).  Patients are able to view lab/test results, encounter notes, upcoming appointments, etc.  Non-urgent messages can be sent to your provider as well.   To learn more about what you  can do with MyChart, go to [email or web address].    Your next appointment:   6 month(s)  Provider:   Eula Listen, PA-C

## 2022-12-22 ENCOUNTER — Other Ambulatory Visit: Payer: Self-pay

## 2022-12-22 DIAGNOSIS — N1832 Chronic kidney disease, stage 3b: Secondary | ICD-10-CM

## 2022-12-22 MED ORDER — RYBELSUS 14 MG PO TABS
14.0000 mg | ORAL_TABLET | Freq: Every day | ORAL | 3 refills | Status: DC
Start: 2022-12-22 — End: 2023-10-30

## 2022-12-22 NOTE — Progress Notes (Signed)
New prescription for Novo PAP.

## 2022-12-23 DIAGNOSIS — G4733 Obstructive sleep apnea (adult) (pediatric): Secondary | ICD-10-CM | POA: Diagnosis not present

## 2022-12-28 DIAGNOSIS — E1122 Type 2 diabetes mellitus with diabetic chronic kidney disease: Secondary | ICD-10-CM | POA: Diagnosis not present

## 2023-01-18 DIAGNOSIS — Z79899 Other long term (current) drug therapy: Secondary | ICD-10-CM | POA: Diagnosis not present

## 2023-01-18 DIAGNOSIS — K080 Exfoliation of teeth due to systemic causes: Secondary | ICD-10-CM | POA: Diagnosis not present

## 2023-01-19 ENCOUNTER — Other Ambulatory Visit: Payer: Self-pay | Admitting: Pharmacist

## 2023-01-19 NOTE — Progress Notes (Signed)
Care Coordination Call  While speaking with patient's wife regarding Marcelline Deist assistance, she requested we also pursue assistance for patient. Will collaborate with technician team to start application.   Catie Eppie Gibson, PharmD, BCACP, CPP Clinical Pharmacist [facility] [phone]

## 2023-01-23 DIAGNOSIS — G4733 Obstructive sleep apnea (adult) (pediatric): Secondary | ICD-10-CM | POA: Diagnosis not present

## 2023-01-24 ENCOUNTER — Other Ambulatory Visit (HOSPITAL_COMMUNITY): Payer: Self-pay

## 2023-01-24 ENCOUNTER — Telehealth: Payer: Self-pay | Admitting: Cardiology

## 2023-01-24 ENCOUNTER — Telehealth: Payer: Self-pay

## 2023-01-24 NOTE — Telephone Encounter (Signed)
-----   Message from [employer] sent at 01/19/2023 11:13 AM EDT ----- Please start application for James Lee, provider portion to Dr. Clent Ridges. Please add [name] already enrolled) to spreadsheet for 2024 re-enrollment

## 2023-01-24 NOTE — Telephone Encounter (Signed)
Pt returning call to [facility] for lab results   Best number [phone]

## 2023-01-24 NOTE — Telephone Encounter (Signed)
The patient has been notified of the result along with recommendations. Pt verbalized understanding. All questions (if any) were answered    Chad, Katlyn D, NP  P Cv Div [place] Triage Please let Mr. Gish know his kidney function is stable and electrolytes are normal.His white count is mildly elevated, he can follow up with his PCP regarding this. His LDL is at goal at 47 but his triglycerides remain elevated at 375, this has been the case with prior cholesterol checks. Would recommend discussing with your PCP as they have historically managed your cholesterol.

## 2023-01-24 NOTE — Telephone Encounter (Signed)
PAP: PAP application for [employer], [employer] rights manager ([employer])) has been mailed to pt's home address on file. Will fax provider portion of application to provider's office when pt's portion is received.

## 2023-01-27 DIAGNOSIS — E1122 Type 2 diabetes mellitus with diabetic chronic kidney disease: Secondary | ICD-10-CM | POA: Diagnosis not present

## 2023-02-06 ENCOUNTER — Ambulatory Visit: Payer: Medicare Other | Admitting: Podiatry

## 2023-02-06 ENCOUNTER — Encounter: Payer: Self-pay | Admitting: Podiatry

## 2023-02-06 ENCOUNTER — Telehealth: Payer: Self-pay

## 2023-02-06 DIAGNOSIS — B351 Tinea unguium: Secondary | ICD-10-CM

## 2023-02-06 DIAGNOSIS — E1142 Type 2 diabetes mellitus with diabetic polyneuropathy: Secondary | ICD-10-CM | POA: Diagnosis not present

## 2023-02-06 DIAGNOSIS — M79674 Pain in right toe(s): Secondary | ICD-10-CM

## 2023-02-06 DIAGNOSIS — M79675 Pain in left toe(s): Secondary | ICD-10-CM | POA: Diagnosis not present

## 2023-02-06 NOTE — Telephone Encounter (Signed)
Left message to return call to our office.  

## 2023-02-07 ENCOUNTER — Telehealth: Payer: Self-pay

## 2023-02-07 NOTE — Telephone Encounter (Signed)
Sent mychart messsage.

## 2023-02-11 NOTE — Progress Notes (Signed)
  Subjective:  Patient ID: James Lee, male    DOB: 1950/12/31,  MRN: [id number]  72 y.o. male presents to clinic with  at risk foot care with history of diabetic neuropathy  Chief Complaint  Patient presents with   Nail Problem    Dfc,Referring Provider Dana Allan, MD,lov:05/24,A1C:7.0,BS:usually runs around 130 daily per patient      New problem(s): None   PCP is Dana Allan, MD.  No Known Allergies  Review of Systems: Negative except as noted in the HPI.   Objective:  James Lee is a pleasant 72 y.o. male obese in NAD.Marland Kitchen  Vascular Examination: Vascular status intact b/l with palpable pedal pulses. CFT immediate b/l. No edema. No pain with calf compression b/l. Skin temperature gradient WNL b/l. Pedal hair sparse.  Neurological Examination: Sensation grossly intact b/l with 10 gram monofilament. Vibratory sensation intact b/l. Pt has subjective symptoms of neuropathy.  Dermatological Examination: Pedal skin with normal turgor, texture and tone b/l. Toenails 1-5 b/l thick, discolored, elongated with subungual debris and pain on dorsal palpation. No hyperkeratotic lesions noted b/l.   Musculoskeletal Examination: Muscle strength 5/5 to b/l LE. Hallux hammertoe noted b/l LE.  Radiographs: None  Last A1c:      Latest Ref Rng & Units 10/24/2022    5:22 PM 07/13/2022    9:14 AM 02/24/2022   10:48 AM  Hemoglobin A1C  Hemoglobin-A1c 4.0 - 5.6 % 7.0  7.6  8.2      Assessment:   1. Pain due to onychomycosis of toenails of both feet   2. Diabetic polyneuropathy associated with type 2 diabetes mellitus (HCC)    Plan:  -Consent given for treatment as described below: -Examined patient. -No new findings. No new orders. -Continue foot and shoe inspections daily. Monitor blood glucose per PCP/Endocrinologist's recommendations. -Patient to continue soft, supportive shoe gear daily. -Toenails 1-5 b/l were debrided in length and girth with sterile nail nippers and dremel  without iatrogenic bleeding.  -Patient/POA to call should there be question/concern in the interim.  Return in about 3 months (around 05/09/2023).  Freddie Breech, DPM

## 2023-02-16 DIAGNOSIS — K080 Exfoliation of teeth due to systemic causes: Secondary | ICD-10-CM | POA: Diagnosis not present

## 2023-02-23 DIAGNOSIS — G4733 Obstructive sleep apnea (adult) (pediatric): Secondary | ICD-10-CM | POA: Diagnosis not present

## 2023-02-27 DIAGNOSIS — E1122 Type 2 diabetes mellitus with diabetic chronic kidney disease: Secondary | ICD-10-CM | POA: Diagnosis not present

## 2023-02-27 DIAGNOSIS — G4733 Obstructive sleep apnea (adult) (pediatric): Secondary | ICD-10-CM | POA: Diagnosis not present

## 2023-03-09 NOTE — Telephone Encounter (Signed)
My chart outreach attempt

## 2023-03-14 ENCOUNTER — Other Ambulatory Visit: Payer: Self-pay | Admitting: Cardiology

## 2023-03-15 NOTE — Telephone Encounter (Signed)
Received message from pt's wife's mychart stating that they both needed applications resent. Have resent application

## 2023-03-20 ENCOUNTER — Emergency Department
Admission: EM | Admit: 2023-03-20 | Discharge: 2023-03-20 | Discharge status: Against Medical Advice | Payer: Medicare Other | Attending: Emergency Medicine | Admitting: Emergency Medicine

## 2023-03-20 ENCOUNTER — Other Ambulatory Visit: Payer: Self-pay

## 2023-03-20 DIAGNOSIS — W07XXXA Fall from chair, initial encounter: Secondary | ICD-10-CM | POA: Insufficient documentation

## 2023-03-20 DIAGNOSIS — Z5321 Procedure and treatment not carried out due to patient leaving prior to being seen by health care provider: Secondary | ICD-10-CM | POA: Insufficient documentation

## 2023-03-20 DIAGNOSIS — S90812A Abrasion, left foot, initial encounter: Secondary | ICD-10-CM | POA: Diagnosis not present

## 2023-03-20 DIAGNOSIS — R55 Syncope and collapse: Secondary | ICD-10-CM | POA: Diagnosis not present

## 2023-03-20 DIAGNOSIS — S99922A Unspecified injury of left foot, initial encounter: Secondary | ICD-10-CM | POA: Diagnosis not present

## 2023-03-20 DIAGNOSIS — I10 Essential (primary) hypertension: Secondary | ICD-10-CM | POA: Diagnosis not present

## 2023-03-20 LAB — BASIC METABOLIC PANEL
Anion gap: 10 (ref 5–15)
BUN: 28 mg/dL — ABNORMAL HIGH (ref 8–23)
CO2: 25 mmol/L (ref 22–32)
Calcium: 8.9 mg/dL (ref 8.9–10.3)
Chloride: 102 mmol/L (ref 98–111)
Creatinine, Ser: 1.87 mg/dL — ABNORMAL HIGH (ref 0.61–1.24)
GFR, Estimated: 38 mL/min — ABNORMAL LOW (ref >60)
Glucose, Bld: 146 mg/dL — ABNORMAL HIGH (ref 70–99)
Potassium: 3.5 mmol/L (ref 3.5–5.1)
Sodium: 137 mmol/L (ref 135–145)

## 2023-03-20 LAB — CBC
HCT: 45.8 % (ref 39.0–52.0)
Hemoglobin: 15.4 g/dL (ref 13.0–17.0)
MCH: 31.3 pg (ref 26.0–34.0)
MCHC: 33.6 g/dL (ref 30.0–36.0)
MCV: 93.1 fL (ref 80.0–100.0)
Platelets: 227 10*3/uL (ref 150–400)
RBC: 4.92 MIL/uL (ref 4.22–5.81)
RDW: 12.7 % (ref 11.5–15.5)
WBC: 9.6 10*3/uL (ref 4.0–10.5)
nRBC: 0 % (ref 0.0–0.2)

## 2023-03-20 NOTE — ED Notes (Signed)
Pt reports does not want to stay, states is leaving and will follow up with PCP

## 2023-03-20 NOTE — ED Triage Notes (Signed)
Pt to ED [facility] from home for LOC while sitting in recliner. Slid out of recliner onto floor. Has abrasions to left foot.  93% on RA, EMS placed on 3 L [place].  +dizziness.

## 2023-03-22 ENCOUNTER — Telehealth: Payer: Self-pay | Admitting: Nurse Practitioner

## 2023-03-22 ENCOUNTER — Encounter: Payer: Self-pay | Admitting: Nurse Practitioner

## 2023-03-22 ENCOUNTER — Ambulatory Visit: Payer: Medicare Other | Admitting: Nurse Practitioner

## 2023-03-22 VITALS — BP 142/80 | HR 82 | Temp 97.6°F | Ht 71.0 in | Wt 252.8 lb

## 2023-03-22 DIAGNOSIS — E66812 Obesity, class 2: Secondary | ICD-10-CM

## 2023-03-22 DIAGNOSIS — J449 Chronic obstructive pulmonary disease, unspecified: Secondary | ICD-10-CM

## 2023-03-22 DIAGNOSIS — J439 Emphysema, unspecified: Secondary | ICD-10-CM

## 2023-03-22 DIAGNOSIS — R918 Other nonspecific abnormal finding of lung field: Secondary | ICD-10-CM | POA: Diagnosis not present

## 2023-03-22 DIAGNOSIS — Z6835 Body mass index (BMI) 35.0-35.9, adult: Secondary | ICD-10-CM

## 2023-03-22 DIAGNOSIS — G4733 Obstructive sleep apnea (adult) (pediatric): Secondary | ICD-10-CM | POA: Diagnosis not present

## 2023-03-22 DIAGNOSIS — R55 Syncope and collapse: Secondary | ICD-10-CM

## 2023-03-22 DIAGNOSIS — J4489 Other specified chronic obstructive pulmonary disease: Secondary | ICD-10-CM

## 2023-03-22 DIAGNOSIS — E6609 Other obesity due to excess calories: Secondary | ICD-10-CM

## 2023-03-22 MED ORDER — TRELEGY ELLIPTA 100-62.5-25 MCG/ACT IN AEPB: 1.0000 | INHALATION_SPRAY | Freq: Every day | RESPIRATORY_TRACT | 0 refills | Status: DC

## 2023-03-22 MED ORDER — ALBUTEROL SULFATE HFA 108 (90 BASE) MCG/ACT IN AERS
2.0000 | INHALATION_SPRAY | Freq: Four times a day (QID) | RESPIRATORY_TRACT | 2 refills | Status: AC | PRN
Start: 2023-03-22 — End: ?

## 2023-03-22 NOTE — Patient Instructions (Addendum)
Continue to use CPAP every night, minimum of 4-6 hours a night.  Change equipment as directed. Wash your tubing with warm soap and water daily, hang to dry. Wash humidifier portion weekly. Use bottled, distilled water and change daily Be aware of reduced alertness and do not drive or operate heavy machinery if experiencing this or drowsiness.  Exercise encouraged, as tolerated. Healthy weight management discussed.  Avoid or decrease alcohol consumption and medications that make you more sleepy, if possible. Notify if persistent daytime sleepiness occurs even with consistent use of PAP therapy.  We discussed how untreated sleep apnea puts an individual at risk for cardiac arrhthymias, pulm HTN, DM, stroke and increases their risk for daytime accidents.   Continue Albuterol inhaler 2 puffs every 6 hours as needed for shortness of breath or wheezing. Notify if symptoms persist despite rescue inhaler/neb use.  I'll have the pharmacy team run a test claim to see what inhalers are covered by your insurance. In the meantime, use the samples of Trelegy we provided - 1 puff daily. Brush tongue and rinse mouth afterwards  Your echocardiogram from May looked ok. Nothing to explain the fainting spell you had. You should call your heart doctor to let them know of the episode.   Follow up in 6 weeks with Dr. Belia Heman or Philis Nettle. If symptoms do not improve or worsen, please contact office for sooner follow up or seek emergency care.

## 2023-03-22 NOTE — Assessment & Plan Note (Signed)
Continue yearly CTs with lung cancer screening program

## 2023-03-22 NOTE — Assessment & Plan Note (Signed)
BMI 35. Healthy weight loss encouraged.  

## 2023-03-22 NOTE — Assessment & Plan Note (Signed)
Severe OSA on CPAP.  He has received his new machine.  Receives benefit from use.  Still has some daytime fatigue symptoms, that are likely multifactorial.  These are tolerable for him.  He has excellent compliance and control on download.  No significant leaks.  Residual AHI 2.2.  Aware of proper care/use of device.  Safe driving practices reviewed.  Patient Instructions  Continue to use CPAP every night, minimum of 4-6 hours a night.  Change equipment as directed. Wash your tubing with warm soap and water daily, hang to dry. Wash humidifier portion weekly. Use bottled, distilled water and change daily Be aware of reduced alertness and do not drive or operate heavy machinery if experiencing this or drowsiness.  Exercise encouraged, as tolerated. Healthy weight management discussed.  Avoid or decrease alcohol consumption and medications that make you more sleepy, if possible. Notify if persistent daytime sleepiness occurs even with consistent use of PAP therapy.  We discussed how untreated sleep apnea puts an individual at risk for cardiac arrhthymias, pulm HTN, DM, stroke and increases their risk for daytime accidents.   Continue Albuterol inhaler 2 puffs every 6 hours as needed for shortness of breath or wheezing. Notify if symptoms persist despite rescue inhaler/neb use.  I'll have the pharmacy team run a test claim to see what inhalers are covered by your insurance. In the meantime, use the samples of Trelegy we provided - 1 puff daily. Brush tongue and rinse mouth afterwards  Your echocardiogram from May looked ok. Nothing to explain the fainting spell you had. You should call your heart doctor to let them know of the episode.   Follow up in 6 weeks with James Lee or James Lee. If symptoms do not improve or worsen, please contact office for sooner follow up or seek emergency care.

## 2023-03-22 NOTE — Assessment & Plan Note (Signed)
Mild obstruction with symptoms of chronic bronchitis.  He was unable to afford Trelegy so did not continue this after his last visit.  He is using albuterol couple times a week.  Does receive benefit from this.  Will provide him with additional samples of Trelegy and then run to test claims to see what would be covered by his insurance.  Advised on smoking cessation.  Action plan in place.  Encouraged to remain active.

## 2023-03-22 NOTE — Progress Notes (Signed)
@Patient  ID: James Lee, male    DOB: [birth year], 72 y.o.   MRN: [id number]  Chief Complaint  Patient presents with   Follow-up    Wears CPAP nightly. Wears average of 8 hours. No problems pressure. Will change from nasal to full face mask.     Referring provider: Dana Allan, MD  HPI: 72 year old male, active smoker followed for OSA on CPAP and COPD.  He is a patient Dr. Clovis Fredrickson and last seen in office 08/30/2022.  Past medical history significant for hypertension, diabetes, obesity, HLD.  TEST/EVENTS:  10/2014 PFT: FVC 88, FEV1 87, ratio 65, TLC 101, DLCOunc 87. No BD 11/08/2022 echo: EF 55-60%. GIDD. RV size and function nl.   08/30/2022: OV with Dr. Belia Heman has a history of severe sleep apnea, diagnosed 20 years ago.  Using CPAP machine nightly.  Does not have a download to assess therapy.  Without his CPAP, he has very loud snoring, difficulties breathing, morning headaches and nonrefreshing sleep.  Still has some daytime tiredness despite use.  He has a diagnosis of COPD.  Uses albuterol as needed.  Actively smoking.  Has a chronic cough with sputum production, consistent with chronic bronchitis.  Smoking cessation advised.  Will trial him on Trelegy.  Sleep study ordered to reassess severity of sleep apnea and get him a new CPAP machine.  Continue lung cancer screening CTs every year.  03/22/2023: Today-follow-up Patient presents today for follow-up.  Since he was here last, he had sleep study that revealed severe OSA.  He was able to get a new CPAP machine.  He is currently wearing a nasal mask but wants to switch over to the fullface mask.  He does not have the proper connector device.  He is going to go by his DME company when he leaves here to see if they can help him with this.  Does feel like he sleeps better with CPAP.  Still feels tired during the day.  Does not want any additional medications.  Feels like it is tolerable as he is retired and can nap if he wants to.  Denies any issues  with drowsy driving, morning headaches or sleep parasomnia/paralysis. His breathing is feeling right the same as it was last time he was here.  He is using albuterol 1-2 times a week.  He feels like he is able to do most of the things that he wants to do.  His main complaint is his persistent daily cough.  Produces clear phlegm.  Unchanged from his last visit. He also has daily chest congestion. He denies fevers, chills, night sweats, hemoptysis, anorexia, leg swelling, orthopnea. He's still smoking.  Unable to afford Trelegy. He did have a recent syncopal episode.  He did not have any symptoms prior to the episode. He was sitting in a chair and just lost consciousness according to his wife. He was taken to the ED. He tells me everything was normal there. Note reviewed - He did not wait for full workup but basic labs and EKG were unremarkable. Denies palpitations, lightheadedness/dizziness, CP, vision changes, headaches. He has not had any recurrent episodes since. He had an echo in May that was unremarkable.   02/19/2023-03/20/2023 CPAP 15 cmH2O 30/30 days; 100% greater than 4 hours; average use 8 hours 47 minutes Leaks 95th 44.4 AHI 2.2  No Known Allergies  Immunization History  Administered Date(s) Administered   Influenza,inj,Quad PF,6+ Mos 03/10/2018, 02/24/2022   Influenza-Unspecified 06/13/2013   PNEUMOCOCCAL CONJUGATE-20 02/24/2022   Pfizer Covid-19  Vaccine Bivalent Booster 4yrs & up 12/26/2020, 07/12/2022   Pneumococcal-Unspecified 06/13/2014, 10/28/2016   Td 06/18/2014, 10/28/2016   Tdap 06/18/2014   Unspecified SARS-COV-2 Vaccination 07/27/2019, 08/21/2019, 03/31/2020   Zoster Recombinant(Shingrix) 10/28/2016    Past Medical History:  Diagnosis Date   Bacterial conjunctivitis of both eyes 02/27/2022   Benign essential HTN 09/02/2014   Bronchitis 03/14/2022   Cancer of prostate (HCC) 03/08/2011   Colon cancer screening 05/04/2022   Colon polyps    Coronary artery  calcification seen on CAT scan    Coronary artery calcification seen on CAT scan 09/02/2014   Diabetes mellitus without complication (HCC)    Dyslipidemia    Elevated serum creatinine 11/11/2021   Gastric ulcer 11/29/2017   GERD (gastroesophageal reflux disease)    History of colonic polyps 05/03/2022   Hyperlipidemia    Hypertension    Lactic acidosis 11/11/2021   LBP (low back pain)    Male erectile dysfunction, unspecified 06/21/2011   NIDDM-2 with hyperglycemia 11/11/2021   Perforated bowel (HCC) 11/29/2017   Personal history of prostate cancer 08/16/2012   Prostate cancer (HCC) 2014   prostate    RBBB    RBBB 04/27/2021   Reduced libido 08/16/2012   Scrotal pain 08/16/2012   Sleep apnea    c-pap wears   Syncope 11/10/2021    Tobacco History: Social History   Tobacco Use  Smoking Status Every Day   Current packs/day: 1.50   Average packs/day: 1.5 packs/day for 50.0 years (75.0 ttl pk-yrs)   Types: Cigarettes   Start date: 03/13/2012  Smokeless Tobacco Never   Ready to quit: Not Answered Counseling given: Not Answered   Outpatient Medications Prior to Visit  Medication Sig Dispense Refill   amLODipine (NORVASC) 5 MG tablet Take 5 mg by mouth daily.     aspirin 81 MG tablet Take 1 tablet (81 mg total) by mouth daily. 30 tablet 0   chlorthalidone (HYGROTON) 50 MG tablet Take 50 mg by mouth daily.     Continuous Blood Gluc Sensor (FREESTYLE LIBRE 2 SENSOR) MISC Place 1 sensor to skin every 14 days 1 each 5   docusate sodium (COLACE) 100 MG capsule Take 100 mg by mouth daily.     FARXIGA 10 MG TABS tablet Take 1 tablet (10 mg total) by mouth daily. 90 tablet 3   gabapentin (NEURONTIN) 300 MG capsule Take 600 mg by mouth 2 (two) times daily.  1   metFORMIN (GLUCOPHAGE-XR) 500 MG 24 hr tablet Take 2 tablets (1,000 mg total) by mouth 2 (two) times daily with a meal. 360 tablet 3   metoprolol succinate (TOPROL-XL) 50 MG 24 hr tablet TAKE 1 TABLET(50 MG) BY MOUTH DAILY 90  tablet 2   Multiple Vitamins-Minerals (MULTIVITAMIN WITH MINERALS) tablet Take 1 tablet by mouth daily.     olmesartan (BENICAR) 40 MG tablet Take 40 mg by mouth daily.     rosuvastatin (CRESTOR) 10 MG tablet Take 1 tablet (10 mg total) by mouth daily. 30 tablet 2   Semaglutide (RYBELSUS) 14 MG TABS Take 1 tablet (14 mg total) by mouth daily. 120 tablet 3   albuterol (VENTOLIN HFA) 108 (90 Base) MCG/ACT inhaler Inhale 2 puffs into the lungs every 6 (six) hours as needed for wheezing or shortness of breath.     Fluticasone-Umeclidin-Vilant (TRELEGY ELLIPTA) 200-62.5-25 MCG/ACT AEPB Inhale 1 Act into the lungs daily. 1 each 5   No facility-administered medications prior to visit.     Review of Systems:  Constitutional: No weight loss or gain, night sweats, fevers, chills, or lassitude. +fatigue  HEENT: No headaches, difficulty swallowing, tooth/dental problems, or sore throat. No sneezing, itching, ear ache, nasal congestion, or post nasal drip CV:  No chest pain, orthopnea, PND, swelling in lower extremities, anasarca, dizziness, palpitations, syncope Resp: +shortness of breath with exertion; productive daily cough; chest congestion. No excess mucus or change in color of mucus. No hemoptysis. No wheezing.  No chest wall deformity GI:  No heartburn, indigestion GU: No dysuria, change in color of urine, urgency or frequency.   Skin: No rash, lesions, ulcerations MSK:  No joint pain or swelling.   Neuro: No dizziness or lightheadedness.  Psych: No depression or anxiety. Mood stable.     Physical Exam:  BP (!) 142/80 (BP Location: Left Arm, Patient Position: Sitting, Cuff Size: Normal)   Pulse 82   Temp 97.6 F (36.4 C) (Temporal)   Ht 5\' 11"  (1.803 m)   Wt 252 lb 12.8 oz (114.7 kg)   SpO2 96%   BMI 35.26 kg/m   GEN: Pleasant, interactive, well-kempt; obese; in no acute distress. HEENT:  Normocephalic and atraumatic. PERRLA. Sclera white. Nasal turbinates pink, moist and patent  bilaterally. No rhinorrhea present. Oropharynx pink and moist, without exudate or edema. No lesions, ulcerations, or postnasal drip.  NECK:  Supple w/ fair ROM. No JVD present. Thyroid symmetrical with no goiter or nodules palpated. No lymphadenopathy.   CV: RRR, no m/r/g, no peripheral edema. Pulses intact, +2 bilaterally. No cyanosis, pallor or clubbing. PULMONARY:  Unlabored, regular breathing. Clear bilaterally A&P w/o wheezes/rales/rhonchi. No accessory muscle use.  GI: BS present and normoactive. Soft, non-tender to palpation. No organomegaly or masses detected. MSK: No erythema, warmth or tenderness. Cap refil <2 sec all extrem. No deformities or joint swelling noted.  Neuro: A/Ox3. No focal deficits noted.   Skin: Warm, no lesions or rashe Psych: Normal affect and behavior. Judgement and thought content appropriate.     Lab Results:  CBC    Component Value Date/Time   WBC 9.6 03/20/2023 1442   RBC 4.92 03/20/2023 1442   HGB 15.4 03/20/2023 1442   HGB 15.9 01/18/2023 1450   HCT 45.8 03/20/2023 1442   HCT 47.6 01/18/2023 1450   PLT 227 03/20/2023 1442   PLT 262 01/18/2023 1450   MCV 93.1 03/20/2023 1442   MCV 92 01/18/2023 1450   MCH 31.3 03/20/2023 1442   MCHC 33.6 03/20/2023 1442   RDW 12.7 03/20/2023 1442   RDW 12.9 01/18/2023 1450   LYMPHSABS 2.8 03/06/2018 0946   MONOABS 0.7 03/06/2018 0946   EOSABS 0.2 03/06/2018 0946   BASOSABS 0.1 03/06/2018 0946    BMET    Component Value Date/Time   NA 137 03/20/2023 1442   NA 140 01/18/2023 1450   K 3.5 03/20/2023 1442   CL 102 03/20/2023 1442   CO2 25 03/20/2023 1442   GLUCOSE 146 (H) 03/20/2023 1442   BUN 28 (H) 03/20/2023 1442   BUN 20 01/18/2023 1450   CREATININE 1.87 (H) 03/20/2023 1442   CALCIUM 8.9 03/20/2023 1442   GFRNONAA 38 (L) 03/20/2023 1442   GFRAA >60 03/06/2018 0946    BNP    Component Value Date/Time   BNP 20.1 11/10/2021 1343     Imaging:  No results found.  Administration History      None          Latest Ref Rng & Units 10/22/2014    9:43 AM  PFT Results  FVC-Pre L 4.30   FVC-Predicted Pre % 88   FVC-Post L 4.61   FVC-Predicted Post % 94   Pre FEV1/FVC % % 75   Post FEV1/FCV % % 65   FEV1-Pre L 3.24   FEV1-Predicted Pre % 87   FEV1-Post L 3.00   DLCO uncorrected ml/min/mmHg 29.54   DLCO UNC% % 87   DLVA Predicted % 89   TLC L 7.34   TLC % Predicted % 101   RV % Predicted % 131     No results found for: "NITRICOXIDE"      Assessment & Plan:   Sleep apnea Severe OSA on CPAP.  He has received his new machine.  Receives benefit from use.  Still has some daytime fatigue symptoms, that are likely multifactorial.  These are tolerable for him.  He has excellent compliance and control on download.  No significant leaks.  Residual AHI 2.2.  Aware of proper care/use of device.  Safe driving practices reviewed.  Patient Instructions  Continue to use CPAP every night, minimum of 4-6 hours a night.  Change equipment as directed. Wash your tubing with warm soap and water daily, hang to dry. Wash humidifier portion weekly. Use bottled, distilled water and change daily Be aware of reduced alertness and do not drive or operate heavy machinery if experiencing this or drowsiness.  Exercise encouraged, as tolerated. Healthy weight management discussed.  Avoid or decrease alcohol consumption and medications that make you more sleepy, if possible. Notify if persistent daytime sleepiness occurs even with consistent use of PAP therapy.  We discussed how untreated sleep apnea puts an individual at risk for cardiac arrhthymias, pulm HTN, DM, stroke and increases their risk for daytime accidents.   Continue Albuterol inhaler 2 puffs every 6 hours as needed for shortness of breath or wheezing. Notify if symptoms persist despite rescue inhaler/neb use.  I'll have the pharmacy team run a test claim to see what inhalers are covered by your insurance. In the meantime, use the  samples of Trelegy we provided - 1 puff daily. Brush tongue and rinse mouth afterwards  Your echocardiogram from May looked ok. Nothing to explain the fainting spell you had. You should call your heart doctor to let them know of the episode.   Follow up in 6 weeks with Dr. Belia Heman or Philis Nettle. If symptoms do not improve or worsen, please contact office for sooner follow up or seek emergency care.     COPD with chronic bronchitis and emphysema (HCC) Mild obstruction with symptoms of chronic bronchitis.  He was unable to afford Trelegy so did not continue this after his last visit.  He is using albuterol couple times a week.  Does receive benefit from this.  Will provide him with additional samples of Trelegy and then run to test claims to see what would be covered by his insurance.  Advised on smoking cessation.  Action plan in place.  Encouraged to remain active.  Pulmonary nodules Continue yearly CTs with lung cancer screening program  Class 2 obesity with body mass index (BMI) of 35.0 to 35.9 in adult BMI 35. Healthy weight loss encouraged.   Syncope Unclear etiology. ED precautions advised should he have another event. Recent echo unremarkable. He had carotid US last year with mild disease. He will follow up with his PCP and cardiology.    I spent 35 minutes of dedicated to the care of this patient on the date of this encounter to include pre-visit review of  records, face-to-face time with the patient discussing conditions above, post visit ordering of testing, clinical documentation with the electronic health record, making appropriate referrals as documented, and communicating necessary findings to members of the patients care team.  Noemi Chapel, NP 03/22/2023  Pt aware and understands NP's role.

## 2023-03-22 NOTE — Assessment & Plan Note (Signed)
Unclear etiology. ED precautions advised should he have another event. Recent echo unremarkable. He had carotid US last year with mild disease. He will follow up with his PCP and cardiology.

## 2023-03-23 ENCOUNTER — Other Ambulatory Visit (HOSPITAL_COMMUNITY): Payer: Self-pay

## 2023-03-23 NOTE — Telephone Encounter (Signed)
Per test claims patient is in the Coverage Gap phase contributing to cost:  ICS/LABA/LAMA Trelegy-$155.95 Breztri-$152.98  ICS/LABA Breo-$96.59 Advair HFA-$75.67 Wixela-$56.56 Symbicort-$63.05 Dulera-$81.28  LABA/LAMA Anoro-$112.62 Stiolto-$114.72

## 2023-03-29 ENCOUNTER — Telehealth: Payer: Self-pay

## 2023-03-29 DIAGNOSIS — E1122 Type 2 diabetes mellitus with diabetic chronic kidney disease: Secondary | ICD-10-CM | POA: Diagnosis not present

## 2023-03-29 NOTE — Patient Outreach (Signed)
Care Coordination   03/29/2023 Name: James Lee MRN: [id number] DOB: 03-Jul-1950   Care Coordination Outreach Attempts:  Successful contact made with patient.  Patient states he currently has case management services with his insurance carrier.  Declined additional services.   Follow Up Plan:  No further outreach attempts will be made at this time. We have been unable to contact the patient to offer or enroll patient in care coordination services  Encounter Outcome:  Patient Visit Completed   Care Coordination Interventions:  No, not indicated    [facility] Care Coordination [phone] direct line

## 2023-03-31 MED ORDER — FLUTICASONE-SALMETEROL 100-50 MCG/ACT IN AEPB: 1.0000 | INHALATION_SPRAY | Freq: Two times a day (BID) | RESPIRATORY_TRACT | 11 refills | Status: AC

## 2023-03-31 NOTE — Telephone Encounter (Signed)
Pt is in the coverage gap so Trelegy is going to be expensive. The most affordable option right now is Wixela, which is a step down from the Trelegy but should still help him. This is $56 a month. If this is something he can manage, please send rx for Wixela 1 puff Twice daily. Brush tongue and rinse mouth afterwards to reduce risk of thrush. Side effect profile is similar to that of Trelegy. Thanks.

## 2023-03-31 NOTE — Addendum Note (Signed)
Addended by: Noemi Chapel on: 03/31/2023 11:01 AM   Modules accepted: Orders

## 2023-03-31 NOTE — Telephone Encounter (Signed)
I notified the patient. He is ok with switching to the [facility]. I have sent in the prescription.   Nothing further needed.

## 2023-04-12 ENCOUNTER — Other Ambulatory Visit: Payer: Self-pay | Admitting: Family Medicine

## 2023-04-14 ENCOUNTER — Other Ambulatory Visit: Payer: Self-pay | Admitting: Family Medicine

## 2023-04-17 DIAGNOSIS — N1831 Chronic kidney disease, stage 3a: Secondary | ICD-10-CM | POA: Diagnosis not present

## 2023-04-18 DIAGNOSIS — H04123 Dry eye syndrome of bilateral lacrimal glands: Secondary | ICD-10-CM | POA: Diagnosis not present

## 2023-04-18 DIAGNOSIS — E119 Type 2 diabetes mellitus without complications: Secondary | ICD-10-CM | POA: Diagnosis not present

## 2023-04-18 DIAGNOSIS — Z961 Presence of intraocular lens: Secondary | ICD-10-CM | POA: Diagnosis not present

## 2023-04-18 DIAGNOSIS — H5000 Unspecified esotropia: Secondary | ICD-10-CM | POA: Diagnosis not present

## 2023-04-21 ENCOUNTER — Telehealth: Payer: Self-pay

## 2023-04-21 NOTE — Telephone Encounter (Signed)
Transition Care Management Follow-up Telephone Call Date of discharge and from where: [facility] 10/7 How have you been since you were released from the hospital? Doing fine and following up with PCP later this month Any questions or concerns? No  Items Reviewed: Did the pt receive and understand the discharge instructions provided? Yes  Medications obtained and verified? Yes  Other? No  Any new allergies since your discharge? No  Dietary orders reviewed? No Do you have support at home? Yes     Follow up appointments reviewed:  PCP Hospital f/u appt confirmed? Yes  Scheduled to see PCP on on  @ . Are transportation arrangements needed? No  If their condition worsens, is the pt aware to call PCP or go to the Emergency Dept.? Yes Was the patient provided with contact information for the PCP's office or ED? Yes Was to pt encouraged to call back with questions or concerns? Yes

## 2023-04-24 ENCOUNTER — Telehealth: Payer: Self-pay

## 2023-04-24 NOTE — Telephone Encounter (Signed)
Patient assistance meds are available for pick up      Medication: Rybelsus 14 mg  Sig: take 1 tablet daily   QTY: 2 boxes

## 2023-04-28 DIAGNOSIS — E1122 Type 2 diabetes mellitus with diabetic chronic kidney disease: Secondary | ICD-10-CM | POA: Diagnosis not present

## 2023-05-02 ENCOUNTER — Other Ambulatory Visit: Payer: Self-pay | Admitting: Family Medicine

## 2023-05-02 ENCOUNTER — Ambulatory Visit: Payer: Medicare Other | Admitting: Family Medicine

## 2023-05-02 ENCOUNTER — Encounter: Payer: Self-pay | Admitting: Family Medicine

## 2023-05-02 VITALS — BP 132/80 | HR 79 | Temp 98.0°F | Resp 16 | Ht 71.0 in | Wt 258.1 lb

## 2023-05-02 DIAGNOSIS — E785 Hyperlipidemia, unspecified: Secondary | ICD-10-CM

## 2023-05-02 DIAGNOSIS — E1122 Type 2 diabetes mellitus with diabetic chronic kidney disease: Secondary | ICD-10-CM | POA: Diagnosis not present

## 2023-05-02 DIAGNOSIS — Z7984 Long term (current) use of oral hypoglycemic drugs: Secondary | ICD-10-CM

## 2023-05-02 DIAGNOSIS — R0982 Postnasal drip: Secondary | ICD-10-CM

## 2023-05-02 DIAGNOSIS — Z23 Encounter for immunization: Secondary | ICD-10-CM | POA: Diagnosis not present

## 2023-05-02 DIAGNOSIS — E1169 Type 2 diabetes mellitus with other specified complication: Secondary | ICD-10-CM

## 2023-05-02 DIAGNOSIS — R0989 Other specified symptoms and signs involving the circulatory and respiratory systems: Secondary | ICD-10-CM

## 2023-05-02 DIAGNOSIS — Z720 Tobacco use: Secondary | ICD-10-CM

## 2023-05-02 DIAGNOSIS — E1159 Type 2 diabetes mellitus with other circulatory complications: Secondary | ICD-10-CM

## 2023-05-02 DIAGNOSIS — N1832 Chronic kidney disease, stage 3b: Secondary | ICD-10-CM

## 2023-05-02 DIAGNOSIS — I152 Hypertension secondary to endocrine disorders: Secondary | ICD-10-CM

## 2023-05-02 DIAGNOSIS — T17998A Other foreign object in respiratory tract, part unspecified causing other injury, initial encounter: Secondary | ICD-10-CM

## 2023-05-02 LAB — POCT GLYCOSYLATED HEMOGLOBIN (HGB A1C): Hemoglobin A1C: 7.3 % — AB (ref 4.0–5.6)

## 2023-05-02 MED ORDER — ROSUVASTATIN CALCIUM 10 MG PO TABS: 10.0000 mg | ORAL_TABLET | Freq: Every evening | ORAL | 3 refills | Status: DC

## 2023-05-02 MED ORDER — PANTOPRAZOLE SODIUM 40 MG PO TBEC
40.0000 mg | DELAYED_RELEASE_TABLET | Freq: Every day | ORAL | 0 refills | Status: DC
Start: 2023-05-02 — End: 2023-07-03

## 2023-05-02 MED ORDER — FLUTICASONE PROPIONATE 50 MCG/ACT NA SUSP
2.0000 | Freq: Every day | NASAL | 6 refills | Status: DC
Start: 2023-05-02 — End: 2023-10-30

## 2023-05-02 NOTE — Telephone Encounter (Signed)
Pt picked up 4 boxes  at office visit.

## 2023-05-02 NOTE — Patient Instructions (Addendum)
It was a pleasure meeting you today. Thank you for allowing me to take part in your health care.  Our goals for today as we discussed include:  Referral sent to GI for evaluation of choking episodes Start Protonix 40 mg daily   Start Flonase 2 sprays once a day for post nasal drip  A1c 7.3 Limit sodas  Flu vaccine administered today  Refill sent for requested medication    This is a list of the screening recommended for you and due dates:  Health Maintenance  Topic Date Due   Zoster (Shingles) Vaccine (2 of 2) 12/23/2016   COVID-19 Vaccine (6 - 2023-24 season) 02/12/2023   Medicare Annual Wellness Visit  06/10/2023   Screening for Lung Cancer  06/16/2023   Yearly kidney health urinalysis for diabetes  07/14/2023   Eye exam for diabetics  07/26/2023   Complete foot exam   08/05/2023   Hemoglobin A1C  10/30/2023   Colon Cancer Screening  11/02/2023   Yearly kidney function blood test for diabetes  03/19/2024   DTaP/Tdap/Td vaccine (4 - Td or Tdap) 10/29/2026   Pneumonia Vaccine  Completed   Flu Shot  Completed   Hepatitis C Screening  Completed   HPV Vaccine  Aged Out     Follow up in 6 months  If you have any questions or concerns, please do not hesitate to call the office at [phone].  I look forward to our next visit and until then take care and stay safe.  Regards,   Dana Allan, MD   [facility]

## 2023-05-02 NOTE — Progress Notes (Signed)
SUBJECTIVE:   Chief Complaint  Patient presents with   Choking    When drinking and taking medication X a few weeks. Pt states it is not all the time.   Diabetes   HPI Presents to clinic for chronic disease management  Discussed the use of AI scribe software for clinical note transcription with the patient, who gave verbal consent to proceed.  History of Present Illness James Lee, a 72 year old patient with a history of diabetes, hypertension, and a previous prostate cancer, presents for a routine diabetes follow-up. The patient's recent HbA1c has slightly increased from 7.0 to 7.3. Home glucose monitoring reveals an average blood glucose level of 172. The patient is currently on Farxiga, Metformin (2 tablets twice a day), and Rybelsus 14mg , with no recent changes or interruptions in medication regimen.  The patient admits to increased consumption of soda, which may be contributing to the slight elevation in HbA1c. Despite this, there is no intention to adjust the patient's current diabetes medications.  In addition to diabetes management, the patient has been experiencing episodes of choking, primarily when drinking liquids. This has been a recent development over the past few weeks and has escalated to occur 2-3 times a week. The patient denies any pain or difficulty swallowing, drooling, or weight loss.  The patient also reports a constant runny nose and a feeling of full sinuses, which may be contributing to the choking episodes. Has not tried any current treatment for these symptoms.  The patient has a history of smoking, currently reduced to half a pack a day from a pack and a half. Efforts are being made to quit smoking entirely, with the use of patches and a shared smoking rule with a partner.  The patient's other chronic conditions, including hypertension, are managed by another physician. Current medications for these conditions include Olmesartan 40mg  once daily, Metoprolol  50mg  once daily, Chlorthalidone 50mg  once daily, Amlodipine 5mg  once daily, and Rosuvastatin 10mg . The patient denies any side effects from these medications.  The patient has a history of polyps in the rectal area and is due for a follow-up colonoscopy next year. Genetic testing has revealed a predisposition for prostate cancer, which has already been addressed with prostate removal.    PERTINENT PMH / PSH: As above   OBJECTIVE:  BP 132/80   Pulse 79   Temp 98 F (36.7 C)   Resp 16   Ht 5\' 11"  (1.803 m)   Wt 258 lb 2 oz (117.1 kg)   SpO2 96%   BMI 36.00 kg/m    Physical Exam Vitals reviewed.  Constitutional:      General: James Lee is not in acute distress.    Appearance: Normal appearance. James Lee is obese. James Lee is not ill-appearing, toxic-appearing or diaphoretic.  HENT:     Head: Normocephalic.     Nose: Rhinorrhea present.     Mouth/Throat:     Mouth: Mucous membranes are moist.     Pharynx: Postnasal drip present.  Eyes:     General:        Right eye: No discharge.        Left eye: No discharge.  Cardiovascular:     Rate and Rhythm: Normal rate and regular rhythm.     Heart sounds: Normal heart sounds.  Pulmonary:     Effort: Pulmonary effort is normal.     Breath sounds: Normal breath sounds. No wheezing or rhonchi.  Abdominal:     General: Bowel sounds are normal.  Musculoskeletal:        General: Normal range of motion.     Cervical back: Normal range of motion.  Skin:    General: Skin is warm and dry.  Neurological:     Mental Status: James Lee is alert and oriented to person, place, and time. Mental status is at baseline.  Psychiatric:        Mood and Affect: Mood normal.        Behavior: Behavior normal.        Thought Content: Thought content normal.        Judgment: Judgment normal.        05/02/2023    9:03 AM 10/24/2022    4:32 PM 07/13/2022    8:03 AM 06/09/2022    1:16 PM 06/08/2022    8:08 AM  Depression screen PHQ 2/9  Decreased Interest 1 0 1 1 1    Down, Depressed, Hopeless 0 0 1 0 0  PHQ - 2 Score 1 0 2 1 1   Altered sleeping 0 1 1 1 1   Tired, decreased energy 1 1 1  0 0  Change in appetite 2 1 1  0 0  Feeling bad or failure about yourself  0 0 0 0 0  Trouble concentrating 0 0 0 0 0  Moving slowly or fidgety/restless 0 0 0 0 0  Suicidal thoughts 0 0 0 0 0  PHQ-9 Score 4 3 5 2 2   Difficult doing work/chores Not difficult at all Not difficult at all Not difficult at all Not difficult at all Not difficult at all      05/02/2023    9:04 AM 10/24/2022    4:33 PM 07/13/2022    8:04 AM 06/08/2022    8:09 AM  GAD 7 : Generalized Anxiety Score  Nervous, Anxious, on Edge 0 0 0 0  Control/stop worrying 0 0 0 0  Worry too much - different things 1 0 0 0  Trouble relaxing 0 0 0 0  Restless 0 0 0 0  Easily annoyed or irritable 1 1 0 0  Afraid - awful might happen 0 0 0 0  Total GAD 7 Score 2 1 0 0  Anxiety Difficulty Not difficult at all Not difficult at all Not difficult at all Not difficult at all    ASSESSMENT/PLAN:  Type 2 diabetes mellitus with stage 3b chronic kidney disease, without long-term current use of insulin (HCC) Assessment & Plan: A1c increased from 7 to 7.3. Home glucose readings averaging 172. Currently on Farxiga, Metformin 2 tablets twice daily, and Rybelsus 14mg . Patient has increased soda intake. -Continue current medications. -Encourage reduction in soda intake. -Recheck A1c in 3 months.  Orders: -     POCT glycosylated hemoglobin (Hb A1C)  Need for influenza vaccination -     Flu Vaccine Trivalent High Dose (Fluad)  Post-nasal drip Assessment & Plan: Increase post nasal drainage, specifically upon awakening.  -Start Flonase nasal spray 1 spray BID -Humidified air -Continue to encourage smoking cessation -Follow up if no improvement   Orders: -     Fluticasone Propionate; Place 2 sprays into both nostrils daily.  Dispense: 16 g; Refill: 6  Hyperlipidemia associated with type 2 diabetes mellitus  (HCC) Assessment & Plan: On Rosuvastatin 10mg , currently out of medication. -Refill Rosuvastatin 10 mg daily -Check lipids annually  Orders: -     Rosuvastatin Calcium; Take 1 tablet (10 mg total) by mouth every evening.  Dispense: 90 tablet; Refill: 3  Hypertension associated with diabetes (  [facility]) Assessment & Plan: Managed by Nephrology and Cardiology. Current medications include Olmesartan 40mg  daily, Metoprolol 50mg  daily, Chlorthalidone 50mg  daily, and Amlodipine 5mg  daily. -Continue current medications. -Continue to monitor BP at home       Tobacco use Assessment & Plan: Current tobacco use. 75 yr pack year history. Recently decreased from 1.5 packs per day to half a pack per day between two people. Using patches and attempting to quit. -Provide information for 1-800-QUIT-NOW for additional resources and free patches. -Low dose CT chest for lung cancer screening, up to date.  Recommend annual screening -Congratulated on decision to decrease tobacco use to 0.5 ppd. Encouraged to continue to wean tobacco use to off.   Aspiration of liquid, initial encounter Assessment & Plan: Recent onset, occurring 2-3 times per week. No associated pain, drooling, or significant weight loss. No difficulty with solid foods. No previous history of CVA. -Start trial of Protonix for potential silent reflux. -Refer to GI for evaluation and possible EGD  -Discussed speech referral for evaluation of swallow, declined for now and will trial PPI to see if any improvement.  Orders: -     Ambulatory referral to Gastroenterology -     Pantoprazole Sodium; Take 1 tablet (40 mg total) by mouth daily.  Dispense: 30 tablet; Refill: 0     General Health Maintenance -Consider COVID-19 booster at pharmacy. -Continue annual colonoscopies with Dr. Tobi Bastos due to history of rectal polyps.    PDMP reviewed  Return in about 6 months (around 10/30/2023) for PCP, Diabetes management.  Dana Allan, MD

## 2023-05-04 NOTE — Telephone Encounter (Signed)
Placed in Dr. Clent Ridges box.

## 2023-05-05 DIAGNOSIS — N1832 Chronic kidney disease, stage 3b: Secondary | ICD-10-CM | POA: Diagnosis not present

## 2023-05-05 DIAGNOSIS — R809 Proteinuria, unspecified: Secondary | ICD-10-CM | POA: Diagnosis not present

## 2023-05-05 DIAGNOSIS — I1A0 Resistant hypertension: Secondary | ICD-10-CM | POA: Diagnosis not present

## 2023-05-05 DIAGNOSIS — E1122 Type 2 diabetes mellitus with diabetic chronic kidney disease: Secondary | ICD-10-CM | POA: Diagnosis not present

## 2023-05-05 NOTE — Telephone Encounter (Signed)
Form has been faxed.

## 2023-05-11 ENCOUNTER — Encounter: Payer: Self-pay | Admitting: Family Medicine

## 2023-05-11 DIAGNOSIS — Z23 Encounter for immunization: Secondary | ICD-10-CM | POA: Insufficient documentation

## 2023-05-11 DIAGNOSIS — T17998A Other foreign object in respiratory tract, part unspecified causing other injury, initial encounter: Secondary | ICD-10-CM | POA: Insufficient documentation

## 2023-05-11 DIAGNOSIS — R0982 Postnasal drip: Secondary | ICD-10-CM | POA: Insufficient documentation

## 2023-05-11 HISTORY — DX: Other foreign object in respiratory tract, part unspecified causing other injury, initial encounter: T17.998A

## 2023-05-11 NOTE — Assessment & Plan Note (Signed)
A1c increased from 7 to 7.3. Home glucose readings averaging 172. Currently on Farxiga, Metformin 2 tablets twice daily, and Rybelsus 14mg . Patient has increased soda intake. -Continue current medications. -Encourage reduction in soda intake. -Recheck A1c in 3 months.

## 2023-05-11 NOTE — Assessment & Plan Note (Signed)
On Rosuvastatin 10mg , currently out of medication. -Refill Rosuvastatin 10 mg daily -Check lipids annually

## 2023-05-11 NOTE — Assessment & Plan Note (Addendum)
Recent onset, occurring 2-3 times per week. No associated pain, drooling, or significant weight loss. No difficulty with solid foods. No previous history of CVA. -Start trial of Protonix for potential silent reflux. -Refer to GI for evaluation and possible EGD  -Discussed speech referral for evaluation of swallow, declined for now and will trial PPI to see if any improvement.

## 2023-05-11 NOTE — Assessment & Plan Note (Addendum)
Current tobacco use. 75 yr pack year history. Recently decreased from 1.5 packs per day to half a pack per day between two people. Using patches and attempting to quit. -Provide information for 1-800-QUIT-NOW for additional resources and free patches. -Low dose CT chest for lung cancer screening, up to date.  Recommend annual screening -Congratulated on decision to decrease tobacco use to 0.5 ppd. Encouraged to continue to wean tobacco use to off.

## 2023-05-11 NOTE — Assessment & Plan Note (Addendum)
Managed by Nephrology and Cardiology. Current medications include Olmesartan 40mg  daily, Metoprolol 50mg  daily, Chlorthalidone 50mg  daily, and Amlodipine 5mg  daily. -Continue current medications. -Continue to monitor BP at home

## 2023-05-11 NOTE — Assessment & Plan Note (Signed)
Increase post nasal drainage, specifically upon awakening.  -Start Flonase nasal spray 1 spray BID -Humidified air -Continue to encourage smoking cessation -Follow up if no improvement

## 2023-05-15 DIAGNOSIS — L918 Other hypertrophic disorders of the skin: Secondary | ICD-10-CM | POA: Diagnosis not present

## 2023-05-15 DIAGNOSIS — L821 Other seborrheic keratosis: Secondary | ICD-10-CM | POA: Diagnosis not present

## 2023-05-15 DIAGNOSIS — D2339 Other benign neoplasm of skin of other parts of face: Secondary | ICD-10-CM | POA: Diagnosis not present

## 2023-05-15 DIAGNOSIS — L728 Other follicular cysts of the skin and subcutaneous tissue: Secondary | ICD-10-CM | POA: Diagnosis not present

## 2023-05-16 ENCOUNTER — Other Ambulatory Visit: Payer: Self-pay | Admitting: Acute Care

## 2023-05-16 DIAGNOSIS — F17210 Nicotine dependence, cigarettes, uncomplicated: Secondary | ICD-10-CM

## 2023-05-16 DIAGNOSIS — Z122 Encounter for screening for malignant neoplasm of respiratory organs: Secondary | ICD-10-CM

## 2023-05-16 DIAGNOSIS — Z87891 Personal history of nicotine dependence: Secondary | ICD-10-CM

## 2023-05-16 NOTE — Telephone Encounter (Signed)
Received and app has been faxed to company

## 2023-05-18 ENCOUNTER — Encounter: Payer: Self-pay | Admitting: Podiatry

## 2023-05-18 ENCOUNTER — Ambulatory Visit: Payer: Medicare Other | Admitting: Podiatry

## 2023-05-18 DIAGNOSIS — M79675 Pain in left toe(s): Secondary | ICD-10-CM

## 2023-05-18 DIAGNOSIS — B351 Tinea unguium: Secondary | ICD-10-CM

## 2023-05-18 DIAGNOSIS — E1142 Type 2 diabetes mellitus with diabetic polyneuropathy: Secondary | ICD-10-CM | POA: Diagnosis not present

## 2023-05-18 DIAGNOSIS — M79674 Pain in right toe(s): Secondary | ICD-10-CM

## 2023-05-18 NOTE — Progress Notes (Signed)
  Subjective:  Patient ID: James Lee, male    DOB: 29-Jan-1951,  MRN: [id number]  72 y.o. male presents to clinic with  at risk foot care with history of diabetic neuropathy and painful elongated mycotic toenails 1-5 bilaterally which are tender when wearing enclosed shoe gear. Pain is relieved with periodic professional debridement.  Chief Complaint  Patient presents with   Diabetes    "Check-up and a nail trim"   Patient states he had a fall since his last visit. Injured his right foot near 1st MPJ, but states it is healing now.  PCP is Dana Allan, MD.  No Known Allergies  Review of Systems: Negative except as noted in the HPI.   Objective:  James Lee is a pleasant 72 y.o. male obese in NAD.Marland KitchenAAO x 3.  Vascular Examination: Vascular status intact b/l with palpable pedal pulses. CFT immediate b/l. No pain with calf compression b/l. Skin temperature gradient WNL b/l. Trace edema noted BLE.  Neurological Examination: Pt has subjective symptoms of neuropathy.Sensation grossly intact b/l with 10 gram monofilament. Vibratory sensation intact b/l.   Dermatological Examination: Healed abrasion right 1st MPJ.   Pedal skin with normal turgor, texture and tone b/l. Toenails 1-5 b/l thick, discolored, elongated with subungual debris and pain on dorsal palpation. No hyperkeratotic lesions noted b/l.   Musculoskeletal Examination: Muscle strength 5/5 to b/l LE. Hallux hammertoe noted b/l LE. Utilizes cane for ambulation assistance.  Radiographs: None  Last A1c:      Latest Ref Rng & Units 05/02/2023    9:05 AM 10/24/2022    5:22 PM 07/13/2022    9:14 AM  Hemoglobin A1C  Hemoglobin-A1c 4.0 - 5.6 % 7.3  7.0  7.6    Assessment:   1. Pain due to onychomycosis of toenails of both feet   2. Diabetic polyneuropathy associated with type 2 diabetes mellitus (HCC)    Plan:  Patient was evaluated and treated. All patient's and/or POA's questions/concerns addressed on today's visit.  Mycotic toenails 1-5 debrided in length and girth without incident. Continue soft, supportive shoe gear daily. Report any pedal injuries to medical professional. Call office if there are any quesitons/concerns. -Continue foot and shoe inspections daily. Monitor blood glucose per PCP/Endocrinologist's recommendations. -Patient/POA to call should there be question/concern in the interim.  Return in about 3 months (around 08/16/2023).  Freddie Breech, DPM      [facility] LOCATION: [address], [place] [address]                   Office [phone]   [facility] LOCATION: [address] [place], [place] [address] Office [phone]

## 2023-05-21 ENCOUNTER — Encounter: Payer: Self-pay | Admitting: Podiatry

## 2023-05-29 DIAGNOSIS — E1122 Type 2 diabetes mellitus with diabetic chronic kidney disease: Secondary | ICD-10-CM | POA: Diagnosis not present

## 2023-06-08 ENCOUNTER — Telehealth: Payer: Self-pay

## 2023-06-08 NOTE — Telephone Encounter (Signed)
Mailed APP to patient for re-enroll 2025 for [employer] also faxed provider portion to office.

## 2023-06-10 DIAGNOSIS — Z9889 Other specified postprocedural states: Secondary | ICD-10-CM | POA: Diagnosis not present

## 2023-06-10 DIAGNOSIS — M5416 Radiculopathy, lumbar region: Secondary | ICD-10-CM | POA: Diagnosis not present

## 2023-06-10 DIAGNOSIS — E119 Type 2 diabetes mellitus without complications: Secondary | ICD-10-CM | POA: Diagnosis not present

## 2023-06-11 ENCOUNTER — Other Ambulatory Visit: Payer: Self-pay

## 2023-06-11 ENCOUNTER — Emergency Department
Admission: EM | Admit: 2023-06-11 | Discharge: 2023-06-11 | Discharge status: Against Medical Advice | Payer: Medicare Other | Attending: Emergency Medicine | Admitting: Emergency Medicine

## 2023-06-11 ENCOUNTER — Ambulatory Visit: Admission: RE | Admit: 2023-06-11 | Payer: Medicare Other | Source: Ambulatory Visit

## 2023-06-11 DIAGNOSIS — Z5321 Procedure and treatment not carried out due to patient leaving prior to being seen by health care provider: Secondary | ICD-10-CM | POA: Diagnosis not present

## 2023-06-11 DIAGNOSIS — M546 Pain in thoracic spine: Secondary | ICD-10-CM | POA: Diagnosis not present

## 2023-06-11 NOTE — ED Triage Notes (Signed)
C/O Lower back pain middle of low back, then moved to right buttocks area and now radiates down entire right leg.  STates unable to put weight on leg.  Seen through [facility] yesterday for same.  Toradol given and Norco rx.  No relief.   Denies injury.  History of 5 back surgery.  Last in 2019 (neck and lower back surgery)  States urination slow and loose bowels since onset of pain.

## 2023-06-11 NOTE — ED Provider Triage Note (Signed)
Emergency Medicine Provider Triage Evaluation Note  James Lee , a 72 y.o. male  was evaluated in triage.  Pt complains of back pain in the middle of his back 5 days ago. Pain moving from middle of back to right side of his back, then his thigh, down his leg and to his foot progressively each day. He was seen by [facility] yesterday and had negative xrays. Given toradol and norco. Presents today because he cannot tolerate the pain. History of multiple back surgeries.   Review of Systems  Positive: See above Negative: Incontinence, saddle anesthesia  Physical Exam  There were no vitals taken for this visit. Gen:   Awake, no distress   Resp:  Normal effort  MSK:   Moves extremities without difficulty  Other:  4/5 strength in RLE, sensation maintained in BLE  Medical Decision Making  Medically screening exam initiated at 4:56 PM.  Appropriate orders placed.  James Lee was informed that the remainder of the evaluation will be completed by another provider, this initial triage assessment does not replace that evaluation, and the importance of remaining in the ED until their evaluation is complete.     James Ali, PA-C 06/11/23 1709

## 2023-06-12 ENCOUNTER — Ambulatory Visit (INDEPENDENT_AMBULATORY_CARE_PROVIDER_SITE_OTHER): Payer: Medicare Other | Admitting: *Deleted

## 2023-06-12 ENCOUNTER — Ambulatory Visit: Payer: Self-pay | Admitting: Family Medicine

## 2023-06-12 ENCOUNTER — Encounter: Payer: Self-pay | Admitting: Internal Medicine

## 2023-06-12 ENCOUNTER — Ambulatory Visit (INDEPENDENT_AMBULATORY_CARE_PROVIDER_SITE_OTHER): Payer: Medicare Other | Admitting: Internal Medicine

## 2023-06-12 VITALS — BP 178/112 | HR 95 | Ht 71.0 in | Wt 245.0 lb

## 2023-06-12 VITALS — Ht 71.0 in | Wt 250.0 lb

## 2023-06-12 DIAGNOSIS — M5441 Lumbago with sciatica, right side: Secondary | ICD-10-CM

## 2023-06-12 DIAGNOSIS — Z0000 Encounter for general adult medical examination without abnormal findings: Secondary | ICD-10-CM | POA: Diagnosis not present

## 2023-06-12 DIAGNOSIS — M5386 Other specified dorsopathies, lumbar region: Secondary | ICD-10-CM | POA: Diagnosis not present

## 2023-06-12 MED ORDER — OXYCODONE-ACETAMINOPHEN 10-325 MG PO TABS: 1.0000 | ORAL_TABLET | ORAL | 0 refills | Status: DC | PRN

## 2023-06-12 MED ORDER — METHYLPREDNISOLONE ACETATE 40 MG/ML IJ SUSP
40.0000 mg | Freq: Once | INTRAMUSCULAR | Status: AC
Start: 2023-06-12 — End: 2023-06-12
  Administered 2023-06-12: 40 mg via INTRAMUSCULAR

## 2023-06-12 MED ORDER — PREDNISONE 10 MG PO TABS: ORAL_TABLET | ORAL | 0 refills | Status: DC

## 2023-06-12 MED ORDER — GABAPENTIN 300 MG PO CAPS: 600.0000 mg | ORAL_CAPSULE | Freq: Three times a day (TID) | ORAL | 0 refills | Status: DC

## 2023-06-12 NOTE — Telephone Encounter (Signed)
Copied from CRM [id number]. Topic: Clinical - Red Word Triage >> Jun 12, 2023 11:36 AM James Lee wrote: Red Word that prompted transfer to Nurse Triage: Severe pain due to back pain, 9 out of 10 pain level   Chief Complaint: Severe back pain Symptoms: Back pain 9/10, pain progressing down the leg into right buttocks/thigh/foot Frequency: Constant, progressing Pertinent Negatives: Patient denies injury Disposition: [] ED /[] Urgent Care (no appt availability in office) / [x] Appointment(In office/virtual)/ []  [place] [facility]/ [] Home Care/ [] Refused Recommended Disposition /[] [facility] [facility]/ [x]  Follow-up with PCP Additional Notes: Pt reporting pain started Lee few weeks ago and gotten "progressively worse," severe pain in lower back that has moved "into right buttocks, into right thigh, today into foot." Pt reporting that the pain is "constant, agonizing," and that he "cannot find any comfortable position" that allows him to sleep or "even think straight." Pt reporting that the pain is constant and progressing. Pt reporting that he has hx of "4 lower back surgeries and 1 on neck," with hx of congenital disc disease. Pt reporting that he will "occasionally get lower back pain but goes away within Lee couple days" usually. Pt denies recent injury. Pt confirms that he's tried to follow up with his neurosurgeon's office Dr. Marikay Alar at [facility], difficult time getting through to them per pt, able to make appt for Thursday morning. Pt reporting that he "need an MRI to see" what's going on, as this is "not my first rodeo." Pt reporting he needs something to assist in the meantime. Pt reporting that he has been to "2 emergency rooms in the past 2 days, including one for ortho." Pt reporting that he was seen by ortho [facility] on Saturday, saw a PA, "x-ray didn't show anything," and they prescribed acetaminophen-hydrocodone and told him to get in to see his neurosurgeon. Pt reporting  that the prescribed med from UC "doesn't even touch" his pain. Pt reporting he is in so much pain that he cannot walk. Pt not c/o weakness, issues with bowel/bladder. Pt went to ED yesterday but left after waiting so long. Advised pt be examined today. Scheduled appt with PCP office today within 5 hours. Advised to go to ED for any worsening. Pt verbalized understanding.  Reason for Disposition  Unable to walk  Answer Assessment - Initial Assessment Questions 1. ONSET: "When did the pain begin?"      Pt reporting pain started Lee few weeks ago and gotten "progressively worse" 2. LOCATION: "Where does it hurt?" (upper, mid or lower back)     Pt reporting severe pain in lower back that has moved "into right buttocks, into right thigh, today into foot." 3. SEVERITY: "How bad is the pain?"  (e.g., Scale 1-10; mild, moderate, or severe)   - MILD (1-3): Doesn't interfere with normal activities.    - MODERATE (4-7): Interferes with normal activities or awakens from sleep.    - SEVERE (8-10): Excruciating pain, unable to do any normal activities.      Pt reporting that the pain is "constant, agonizing," and that he "cannot find any comfortable position" that allows him to sleep or "even think straight." 4. PATTERN: "Is the pain constant?" (e.g., yes, no; constant, intermittent)      Pt reporting that the pain is constant and progressing. 5. RADIATION: "Does the pain shoot into your legs or somewhere else?"     Pt reporting that the pain has progressed down the leg. 6. CAUSE:  "What  do you think is causing the back pain?"      Pt reporting that he has hx of "4 lower back surgeries and 1 on neck," with hx of congenital disc disease. 8. MEDICINES: "What have you taken so far for the pain?" (e.g., nothing, acetaminophen, NSAIDS)     Pt reporting that he was seen by ortho [facility] on Saturday and they prescribed acetaminophen-hydrocodone, but this "doesn't even touch" his pain. 9. NEUROLOGIC SYMPTOMS: "Do you have  any weakness, numbness, or problems with bowel/bladder control?"     Pt reporting he is in so much pain that he cannot walk. Pt not c/o weakness, issues with bowel/bladder.  Protocols used: Back Pain-Lee-AH

## 2023-06-12 NOTE — Progress Notes (Signed)
Subjective:   James Lee is a 72 y.o. male who presents for Medicare Annual/Subsequent preventive examination.  Visit Complete: Virtual I connected with  James Lee on 06/12/23 by a audio enabled telemedicine application and verified that I am speaking with the correct person using two identifiers.  Patient Location: Home  Provider Location: Office/Clinic  I discussed the limitations of evaluation and management by telemedicine. The patient expressed understanding and agreed to proceed.  Vital Signs: Because this visit was a virtual/telehealth visit, some criteria may be missing or patient reported. Any vitals not documented were not able to be obtained and vitals that have been documented are patient reported.   Cardiac Risk Factors include: advanced age (>24men, >51 women);diabetes mellitus;male gender;hypertension;obesity (BMI >30kg/m2);smoking/ tobacco exposure     Objective:    Today's Vitals   06/12/23 1247 06/12/23 1248  Weight: 250 lb (113.4 kg)   Height: 5\' 11"  (1.803 m)   PainSc:  9    Body mass index is 34.87 kg/m.     06/12/2023    1:02 PM 06/11/2023    5:00 PM 03/20/2023    2:41 PM 11/02/2022    7:48 AM 09/28/2022    8:05 AM 09/14/2022    6:48 AM 06/09/2022    1:07 PM  Advanced Directives  Does Patient Have a Medical Advance Directive? No No No No No No No  Would patient like information on creating a medical advance directive? No - Patient declined No - Patient declined   No - Patient declined Yes (Lee/Ambulatory/Procedural Areas - Information given) No - Patient declined    Current Medications (verified) Outpatient Encounter Medications as of 06/12/2023  Medication Sig   albuterol (VENTOLIN HFA) 108 (90 Base) MCG/ACT inhaler Inhale 2 puffs into the lungs every 6 (six) hours as needed for wheezing or shortness of breath.   amLODipine (NORVASC) 5 MG tablet Take 5 mg by mouth daily.   aspirin 81 MG tablet Take 1 tablet (81 mg total) by mouth daily.    chlorthalidone (HYGROTON) 50 MG tablet Take 50 mg by mouth daily.   Continuous Blood Gluc Sensor (FREESTYLE LIBRE 2 SENSOR) MISC Place 1 sensor to skin every 14 days   docusate sodium (COLACE) 100 MG capsule Take 100 mg by mouth daily.   FARXIGA 10 MG TABS tablet Take 1 tablet (10 mg total) by mouth daily.   fluticasone (FLONASE) 50 MCG/ACT nasal spray Place 2 sprays into both nostrils daily.   fluticasone-salmeterol (WIXELA INHUB) 100-50 MCG/ACT AEPB Inhale 1 puff into the lungs 2 (two) times daily.   gabapentin (NEURONTIN) 300 MG capsule Take 600 mg by mouth 2 (two) times daily.   HYDROcodone-acetaminophen (NORCO/VICODIN) 5-325 MG tablet Take 1 tablet by mouth every 6 (six) hours as needed for moderate pain (pain score 4-6).   metFORMIN (GLUCOPHAGE-XR) 500 MG 24 hr tablet Take 2 tablets (1,000 mg total) by mouth 2 (two) times daily with a meal.   metoprolol succinate (TOPROL-XL) 50 MG 24 hr tablet TAKE 1 TABLET(50 MG) BY MOUTH DAILY   Multiple Vitamins-Minerals (MULTIVITAMIN WITH MINERALS) tablet Take 1 tablet by mouth daily.   olmesartan (BENICAR) 40 MG tablet Take 40 mg by mouth daily.   rosuvastatin (CRESTOR) 10 MG tablet Take 1 tablet (10 mg total) by mouth every evening.   Semaglutide (RYBELSUS) 14 MG TABS Take 1 tablet (14 mg total) by mouth daily.   pantoprazole (PROTONIX) 40 MG tablet Take 1 tablet (40 mg total) by mouth daily. (Patient not taking: Reported  on 06/12/2023)   No facility-administered encounter medications on file as of 06/12/2023.    Allergies (verified) Patient has no known allergies.   History: Past Medical History:  Diagnosis Date   Bacterial conjunctivitis of both eyes 02/27/2022   Benign essential HTN 09/02/2014   Bronchitis 03/14/2022   Cancer of prostate (HCC) 03/08/2011   Colon cancer screening 05/04/2022   Colon polyps    Coronary artery calcification seen on CAT scan    Coronary artery calcification seen on CAT scan 09/02/2014   Diabetes mellitus  without complication (HCC)    Dyslipidemia    Elevated serum creatinine 11/11/2021   Gastric ulcer 11/29/2017   GERD (gastroesophageal reflux disease)    History of colonic polyps 05/03/2022   Hyperlipidemia    Hypertension    Lactic acidosis 11/11/2021   LBP (low back pain)    Male erectile dysfunction, unspecified 06/21/2011   NIDDM-2 with hyperglycemia 11/11/2021   Perforated bowel (HCC) 11/29/2017   Personal history of prostate cancer 08/16/2012   Prostate cancer (HCC) 2014   prostate    RBBB    RBBB 04/27/2021   Reduced libido 08/16/2012   Scrotal pain 08/16/2012   Sleep apnea    c-pap wears   Syncope 11/10/2021   Past Surgical History:  Procedure Laterality Date   ABDOMINAL SURGERY     gastric ulcer   back sugery     1996, 2017, 2018   BACK SURGERY  1996   2017, 2018, 2019   CATARACT EXTRACTION Bilateral 2024   CATARACT EXTRACTION W/PHACO Left 09/14/2022   Procedure: CATARACT EXTRACTION PHACO AND INTRAOCULAR LENS PLACEMENT (IOC) LEFT DIABETIC 16.26 01:17.3;  Surgeon: Lockie Mola, MD;  Location: [facility];  Service: Ophthalmology;  Laterality: Left;   CATARACT EXTRACTION W/PHACO Right 09/28/2022   Procedure: CATARACT EXTRACTION PHACO AND INTRAOCULAR LENS PLACEMENT (IOC) RIGHT DIABETIC  9.93  00:49.4;  Surgeon: Lockie Mola, MD;  Location: [facility];  Service: Ophthalmology;  Laterality: Right;  Diabetic   COLONOSCOPY WITH PROPOFOL N/A 05/03/2022   Procedure: COLONOSCOPY WITH PROPOFOL;  Surgeon: Wyline Mood, MD;  Location: [facility] END[facility]COPY;  Service: Gastroenterology;  Laterality: N/A;   COLONOSCOPY WITH PROPOFOL N/A 05/04/2022   Procedure: COLONOSCOPY WITH PROPOFOL;  Surgeon: Wyline Mood, MD;  Location: [facility] END[facility];  Service: Gastroenterology;  Laterality: N/A;   COLONOSCOPY WITH PROPOFOL N/A 11/02/2022   Procedure: COLONOSCOPY WITH PROPOFOL;  Surgeon: Wyline Mood, MD;  Location: [facility] ENDOSCOPY;  Service: Gastroenterology;   Laterality: N/A;   gastric ulcers     surgery 11-28-17   INGUINAL HERNIA REPAIR     right   LAPAROSCOPY N/A 11/28/2017   Procedure: LAPAROSCOPY DIAGNOSTIC converted to open;  Surgeon: Axel Filler, MD;  Location: [facility] OR;  Service: General;  Laterality: N/A;   LAPAROTOMY N/A 11/28/2017   Procedure: EXPLORATORY LAPAROTOMY;  Surgeon: Axel Filler, MD;  Location: [facility] OR;  Service: General;  Laterality: N/A;   MENISCUS REPAIR Right    POSTERIOR CERVICAL FUSION/FORAMINOTOMY N/A 11/10/2017   Procedure: Posterior cervical fusion with lateral mass fixation Cervical Three-Seven, cervical laminectomy Cervical Three- Seven;  Surgeon: Tia Alert, MD;  Location: [facility] OR;  Service: Neurosurgery;  Laterality: N/A;  Posterior cervical fusion with lateral mass fixation Cervical Three-Seven, cervical laminectomy Cervical Three- Seven   PROSTATECTOMY  01/2012   TONSILLECTOMY     TOOTH EXTRACTION     Family History  Problem Relation Age of Onset   Hypertension Mother    Hyperlipidemia Mother    Early death  Mother    Drug abuse Mother    Alcohol abuse Mother    Coronary artery disease Mother    Throat cancer Father    Esophageal cancer Father        dx late 39s, d. 28   Diabetes Sister    Depression Sister    Prostate cancer Brother 45   Colon cancer Neg Hx    Rectal cancer Neg Hx    Stomach cancer Neg Hx    Social History   Socioeconomic History   Marital status: Married    Spouse name: Not on file   Number of children: 2   Years of education: Not on file   Highest education level: Not on file  Occupational History   Occupation: Tel Psychologist, prison and probation services: [employer]  Tobacco Use   Smoking status: Every Day    Current packs/day: 1.50    Average packs/day: 1.5 packs/day for 50.2 years (75.4 ttl pk-yrs)    Types: Cigarettes    Start date: 03/13/2012   Smokeless tobacco: Never  Vaping Use   Vaping status: Never Used  Substance and Sexual Activity   Alcohol use: Yes    Comment:  occasionally   Drug use: No   Sexual activity: Not on file  Other Topics Concern   Not on file  Social History Narrative   Not on file   Social Drivers of Health   Financial Resource Strain: Low Risk  (06/12/2023)   Overall Financial Resource Strain (CARDIA)    Difficulty of Paying Living Expenses: Not hard at all  Food Insecurity: No Food Insecurity (06/12/2023)   Hunger Vital Sign    Worried About Running Out of Food in the Last Year: Never true    Ran Out of Food in the Last Year: Never true  Transportation Needs: No Transportation Needs (06/12/2023)   PRAPARE - Administrator, Civil Service (Medical): No    Lack of Transportation (Non-Medical): No  Physical Activity: Inactive (06/12/2023)   Exercise Vital Sign    Days of Exercise per Week: 0 days    Minutes of Exercise per Session: 0 min  Stress: No Stress Concern Present (06/12/2023)   [employer] of Occupational Health - Occupational Stress Questionnaire    Feeling of Stress : Only a little  Social Connections: Moderately Isolated (06/12/2023)   Social Connection and Isolation Panel [NHANES]    Frequency of Communication with Friends and Family: More than three times a week    Frequency of Social Gatherings with Friends and Family: More than three times a week    Attends Religious Services: Never    Database administrator or Organizations: No    Attends Engineer, structural: Never    Marital Status: Married    Tobacco Counseling Ready to quit: Yes Counseling given: Yes   Clinical Intake:  Pre-visit preparation completed: Yes  Pain : 0-10 Pain Score: 9  Pain Type: Chronic pain Pain Location: Back Pain Descriptors / Indicators: Sharp, Radiating Pain Onset: More than a month ago Pain Frequency: Intermittent     BMI - recorded: 34.87 Nutritional Status: BMI > 30  Obese Nutritional Risks: None Diabetes: Yes (FBS 129) CBG done?: Yes (BS 152) CBG resulted in Enter/ Edit results?:  No Did pt. bring in CBG monitor from home?: No  How often do you need to have someone help you when you read instructions, pamphlets, or other written materials from your doctor or pharmacy?: 1 - Never  Interpreter Needed?: No  Information entered by :: R. Kenika Sahm LPN   Activities of Daily Living    06/12/2023   12:51 PM 09/28/2022    8:00 AM  In your present state of health, do you have any difficulty performing the following activities:  Hearing? 0 0  Vision? 0 0  Comment glasses   Difficulty concentrating or making decisions? 0 0  Walking or climbing stairs? 1 0  Dressing or bathing? 0 0  Doing errands, shopping? 1   Preparing Food and eating ? N   Using the Toilet? N   In the past six months, have you accidently leaked urine? Y   Do you have problems with loss of bowel control? N   Managing your Medications? N   Managing your Finances? N   Housekeeping or managing your Housekeeping? N     Patient Care Team: Dana Allan, MD as PCP - General (Family Medicine) Debbe Odea, MD as PCP - Cardiology (Cardiology) Loree Fee, [facility] (Pharmacist) Noemi Chapel, NP as Nurse Practitioner (Nurse Practitioner) Wyline Mood, MD as Consulting Physician (Gastroenterology) Oretha Milch, MD as Consulting Physician (Pulmonary Disease)  Indicate any recent Medical Services you may have received from other than [facility] providers in the past year (date may be approximate).     Assessment:   This is a routine wellness examination for Nikoli.  Hearing/Vision screen Hearing Screening - Comments:: No issues Vision Screening - Comments:: glasses   Goals Addressed             This Visit's Progress    Patient Stated       Wants to quit smoking       Depression Screen    06/12/2023   12:58 PM 05/02/2023    9:03 AM 10/24/2022    4:32 PM 07/13/2022    8:03 AM 06/09/2022    1:16 PM 06/08/2022    8:08 AM 04/11/2022   12:01 PM  PHQ 2/9 Scores  PHQ - 2 Score 0 1 0 2 1 1  0   PHQ- 9 Score 3 4 3 5 2 2      Fall Risk    06/12/2023   12:53 PM 05/02/2023    9:03 AM 10/24/2022    4:31 PM 07/13/2022    8:04 AM 06/09/2022    1:10 PM  Fall Risk   Falls in the past year? 0 1 0 0 0  Number falls in past yr: 0 0  0 0  Injury with Fall? 0 1  0 0  Risk for fall due to : No Fall Risks Impaired balance/gait Impaired balance/gait    Follow up Falls prevention discussed;Falls evaluation completed Falls evaluation completed;Education provided Falls prevention discussed;Falls evaluation completed Falls evaluation completed Falls evaluation completed    MEDICARE RISK AT HOME: Medicare Risk at Home Any stairs in or around the home?: No If so, are there any without handrails?: No Home free of loose throw rugs in walkways, pet beds, electrical cords, etc?: Yes Adequate lighting in your home to reduce risk of falls?: Yes Life alert?: No Use of a cane, walker or w/c?: Yes Grab bars in the bathroom?: Yes Shower chair or bench in shower?: Yes Elevated toilet seat or a handicapped toilet?: No   Cognitive Function:        06/12/2023    1:02 PM 06/09/2022    1:34 PM  6CIT Screen  What Year? 0 points 0 points  What month? 0 points 0 points  What  time? 0 points 0 points  Count back from 20 0 points 0 points  Months in reverse 0 points 0 points  Repeat phrase 0 points 0 points  Total Score 0 points 0 points    Immunizations Immunization History  Administered Date(s) Administered   Fluad Trivalent(High Dose 65+) 05/02/2023   Influenza,inj,Quad PF,6+ Mos 03/10/2018, 02/24/2022   Influenza-Unspecified 06/13/2013   PNEUMOCOCCAL CONJUGATE-20 02/24/2022   Pfizer Covid-19 Vaccine Bivalent Booster 13yrs & up 12/26/2020, 07/12/2022   Pneumococcal-Unspecified 06/13/2014, 10/28/2016   Td 06/18/2014, 10/28/2016   Tdap 06/18/2014   Unspecified SARS-COV-2 Vaccination 07/27/2019, 08/21/2019, 03/31/2020   Zoster Recombinant(Shingrix) 10/28/2016    TDAP status: Up to  date  Flu Vaccine status: Up to date  Pneumococcal vaccine status: Up to date  Covid-19 vaccine status: Information provided on how to obtain vaccines.   Qualifies for Shingles Vaccine? Yes   Zostavax completed No   Shingrix Completed?: No.    Education has been provided regarding the importance of this vaccine. Patient has been advised to call insurance company to determine out of pocket expense if they have not yet received this vaccine. Advised may also receive vaccine at local pharmacy or Health Dept. Verbalized acceptance and understanding.  Screening Tests Health Maintenance  Topic Date Due   Zoster Vaccines- Shingrix (2 of 2) 12/23/2016   COVID-19 Vaccine (6 - 2024-25 season) 02/12/2023   Medicare Annual Wellness (AWV)  06/10/2023   Lung Cancer Screening  06/16/2023   Diabetic kidney evaluation - Urine ACR  07/14/2023   OPHTHALMOLOGY EXAM  07/26/2023   FOOT EXAM  08/05/2023   HEMOGLOBIN A1C  10/30/2023   Colonoscopy  11/02/2023   Diabetic kidney evaluation - eGFR measurement  03/19/2024   DTaP/Tdap/Td (4 - Td or Tdap) 10/29/2026   Pneumonia Vaccine 13+ Years old  Completed   INFLUENZA VACCINE  Completed   Hepatitis C Screening  Completed   HPV VACCINES  Aged Out    Health Maintenance  Health Maintenance Due  Topic Date Due   Zoster Vaccines- Shingrix (2 of 2) 12/23/2016   COVID-19 Vaccine (6 - 2024-25 season) 02/12/2023   Medicare Annual Wellness (AWV)  06/10/2023   Lung Cancer Screening  06/16/2023    Colorectal cancer screening: Type of screening: Colonoscopy. Completed 10/2022. Repeat every 1 years  Lung Cancer Screening: (Low Dose CT Chest recommended if Age 36-80 years, 20 pack-year currently smoking OR have quit w/in 15years.) does qualify.     Additional Screening:  Hepatitis C Screening: does qualify; Completed 02/2022  Vision Screening: Recommended annual ophthalmology exams for early detection of glaucoma and other disorders of the eye. Is the  patient up to date with their annual eye exam?  Yes  Who is the provider or what is the name of the office in which the patient attends annual eye exams? [facility] If pt is not established with a provider, would they like to be referred to a provider to establish care? No .   Dental Screening: Recommended annual dental exams for proper oral hygiene  Diabetic Foot Exam: Diabetic Foot Exam: Completed 07/2022  Community Resource Referral / Chronic Care Management: CRR required this visit?  No   CCM required this visit?  No     Plan:     I have personally reviewed and noted the following in the patient's chart:   Medical and social history Use of alcohol, tobacco or illicit drugs  Current medications and supplements including opioid prescriptions. Patient is currently taking opioid prescriptions.  Information provided to patient regarding non-opioid alternatives. Patient advised to discuss non-opioid treatment plan with their provider. Functional ability and status Nutritional status Physical activity Advanced directives List of other physicians Hospitalizations, surgeries, and ER visits in previous 12 months Vitals Screenings to include cognitive, depression, and falls Referrals and appointments  In addition, I have reviewed and discussed with patient certain preventive protocols, quality metrics, and best practice recommendations. A written personalized care plan for preventive services as well as general preventive health recommendations were provided to patient.     Sydell Axon, LPN   [id number]/29/[id number]   After Visit Summary: (MyChart) Due to this being a telephonic visit, the after visit summary with patients personalized plan was offered to patient via MyChart   Nurse Notes: None

## 2023-06-12 NOTE — Patient Instructions (Signed)
James Lee , Thank you for taking time to come for your Medicare Wellness Visit. I appreciate your ongoing commitment to your health goals. Please review the following plan we discussed and let me know if I can assist you in the future.   Referrals/Orders/Follow-Ups/Clinician Recommendations: Remember to update your Shingles and covid vaccines.  This is a list of the screening recommended for you and due dates:  Health Maintenance  Topic Date Due   Zoster (Shingles) Vaccine (2 of 2) 12/23/2016   COVID-19 Vaccine (6 - 2024-25 season) 02/12/2023   Screening for Lung Cancer  06/16/2023   Yearly kidney health urinalysis for diabetes  07/14/2023   Eye exam for diabetics  07/26/2023   Complete foot exam   08/05/2023   Hemoglobin A1C  10/30/2023   Colon Cancer Screening  11/02/2023   Yearly kidney function blood test for diabetes  03/19/2024   Medicare Annual Wellness Visit  06/11/2024   DTaP/Tdap/Td vaccine (4 - Td or Tdap) 10/29/2026   Pneumonia Vaccine  Completed   Flu Shot  Completed   Hepatitis C Screening  Completed   HPV Vaccine  Aged Out    Advanced directives: (Declined) Advance directive discussed with you today. Even though you declined this today, please call our office should you change your mind, and we can give you the proper paperwork for you to fill out.  Next Medicare Annual Wellness Visit scheduled for next year: Yes 06/17/24 @ 1:40  Managing Pain Without Opioids Opioids are strong medicines used to treat moderate to severe pain. For some people, especially those who have long-term (chronic) pain, opioids may not be the best choice for pain management due to: Side effects like nausea, constipation, and sleepiness. The risk of addiction (opioid use disorder). The longer you take opioids, the greater your risk of addiction. Pain that lasts for more than 3 months is called chronic pain. Managing chronic pain usually requires more than one approach and is often provided by a  team of health care providers working together (multidisciplinary approach). Pain management may be done at a pain management center or pain clinic. How to manage pain without the use of opioids Use non-opioid medicines Non-opioid medicines for pain may include: Over-the-counter or prescription non-steroidal anti-inflammatory drugs (NSAIDs). These may be the first medicines used for pain. They work well for muscle and bone pain, and they reduce swelling. Acetaminophen. This over-the-counter medicine may work well for milder pain but not swelling. Antidepressants. These may be used to treat chronic pain. A certain type of antidepressant (tricyclics) is often used. These medicines are given in lower doses for pain than when used for depression. Anticonvulsants. These are usually used to treat seizures but may also reduce nerve (neuropathic) pain. Muscle relaxants. These relieve pain caused by sudden muscle tightening (spasms). You may also use a pain medicine that is applied to the skin as a patch, cream, or gel (topical analgesic), such as a numbing medicine. These may cause fewer side effects than medicines taken by mouth. Do certain therapies as directed Some therapies can help with pain management. They include: Physical therapy. You will do exercises to gain strength and flexibility. A physical therapist may teach you exercises to move and stretch parts of your body that are weak, stiff, or painful. You can learn these exercises at physical therapy visits and practice them at home. Physical therapy may also involve: Massage. Heat wraps or applying heat or cold to affected areas. Electrical signals that interrupt pain signals (transcutaneous  electrical nerve stimulation, TENS). Weak lasers that reduce pain and swelling (low-level laser therapy). Signals from your body that help you learn to regulate pain (biofeedback). Occupational therapy. This helps you to learn ways to function at home and  work with less pain. Recreational therapy. This involves trying new activities or hobbies, such as a physical activity or drawing. Mental health therapy, including: Cognitive behavioral therapy (CBT). This helps you learn coping skills for dealing with pain. Acceptance and commitment therapy (ACT) to change the way you think and react to pain. Relaxation therapies, including muscle relaxation exercises and mindfulness-based stress reduction. Pain management counseling. This may be individual, family, or group counseling.  Receive medical treatments Medical treatments for pain management include: Nerve block injections. These may include a pain blocker and anti-inflammatory medicines. You may have injections: Near the spine to relieve chronic back or neck pain. Into joints to relieve back or joint pain. Into nerve areas that supply a painful area to relieve body pain. Into muscles (trigger point injections) to relieve some painful muscle conditions. A medical device placed near your spine to help block pain signals and relieve nerve pain or chronic back pain (spinal cord stimulation device). Acupuncture. Follow these instructions at home Medicines Take over-the-counter and prescription medicines only as told by your health care provider. If you are taking pain medicine, ask your health care providers about possible side effects to watch out for. Do not drive or use heavy machinery while taking prescription opioid pain medicine. Lifestyle  Do not use drugs or alcohol to reduce pain. If you drink alcohol, limit how much you have to: 0-1 drink a day for women who are not pregnant. 0-2 drinks a day for men. Know how much alcohol is in a drink. In the [place] one drink equals one 12 oz bottle of beer (355 mL), one 5 oz glass of wine (148 mL), or one 1 oz glass of hard liquor (44 mL). Do not use any products that contain nicotine or tobacco. These products include cigarettes, chewing tobacco, and  vaping devices, such as e-cigarettes. If you need help quitting, ask your health care provider. Eat a healthy diet and maintain a healthy weight. Poor diet and excess weight may make pain worse. Eat foods that are high in fiber. These include fresh fruits and vegetables, whole grains, and beans. Limit foods that are high in fat and processed sugars, such as fried and sweet foods. Exercise regularly. Exercise lowers stress and may help relieve pain. Ask your health care provider what activities and exercises are safe for you. If your health care provider approves, join an exercise class that combines movement and stress reduction. Examples include yoga and tai chi. Get enough sleep. Lack of sleep may make pain worse. Lower stress as much as possible. Practice stress reduction techniques as told by your therapist. General instructions Work with all your pain management providers to find the treatments that work best for you. You are an important member of your pain management team. There are many things you can do to reduce pain on your own. Consider joining an online or in-person support group for people who have chronic pain. Keep all follow-up visits. This is important. Where to find more information You can find more information about managing pain without opioids from: American Academy of Pain Medicine: painmed.org Institute for Chronic Pain: instituteforchronicpain.org American Chronic Pain Association: theacpa.org Contact a health care provider if: You have side effects from pain medicine. Your pain gets worse or  does not get better with treatments or home therapy. You are struggling with anxiety or depression. Summary Many types of pain can be managed without opioids. Chronic pain may respond better to pain management without opioids. Pain is best managed when you and a team of health care providers work together. Pain management without opioids may include non-opioid medicines,  medical treatments, physical therapy, mental health therapy, and lifestyle changes. Tell your health care providers if your pain gets worse or is not being managed well enough. This information is not intended to replace advice given to you by your health care provider. Make sure you discuss any questions you have with your health care provider. Document Revised: 09/09/2020 Document Reviewed: 09/09/2020 Elsevier Patient Education  2024 [employer].

## 2023-06-12 NOTE — Telephone Encounter (Signed)
Noted  

## 2023-06-12 NOTE — Patient Instructions (Addendum)
INCREASE GABAPENTIN TO 600 MG THREE TIMES DAILY     I am prescribing a stronger narcotic:  OXYCODONE INSTEAD OF HYDROCODONE .  1 tablet EVERY 4 HOURS AS NEEDED    PREDNISONE TAPER START TOMORROW IN THE MORNING    USE DULCOLAX, EX LAX OR SENNOKOT S TO PREVENT OPIOID INDUCED  CONSTIPATION

## 2023-06-13 ENCOUNTER — Ambulatory Visit: Payer: Medicare Other | Admitting: Cardiology

## 2023-06-13 DIAGNOSIS — M5386 Other specified dorsopathies, lumbar region: Secondary | ICD-10-CM | POA: Insufficient documentation

## 2023-06-13 DIAGNOSIS — G4733 Obstructive sleep apnea (adult) (pediatric): Secondary | ICD-10-CM | POA: Diagnosis not present

## 2023-06-13 NOTE — Assessment & Plan Note (Addendum)
 Pain management needed until he can be seen by neurosurgery.  Methylprednisone 40 mg IM injection given.  Increase gabapentin  to 300 mg TID.  Narcotic Refill history confirmed via [place] Controlled Substance databas, accessed by me today shows that he received #12 hydrocodone on oct 28 . He is in obvious serious pain and wife is with him today.     D/c hydrocodone and rx oxycodone /APAP 10 mg every 4 hours prn  MRI offered but he has appt with his neurosurgeon on Jan 2

## 2023-06-15 ENCOUNTER — Other Ambulatory Visit: Payer: Self-pay | Admitting: Neurological Surgery

## 2023-06-15 DIAGNOSIS — M5416 Radiculopathy, lumbar region: Secondary | ICD-10-CM

## 2023-06-15 DIAGNOSIS — Z6834 Body mass index (BMI) 34.0-34.9, adult: Secondary | ICD-10-CM | POA: Diagnosis not present

## 2023-06-19 ENCOUNTER — Other Ambulatory Visit: Payer: Medicare Other

## 2023-06-19 ENCOUNTER — Ambulatory Visit: Admission: RE | Admit: 2023-06-19 | Payer: Medicare Other | Source: Ambulatory Visit

## 2023-06-22 ENCOUNTER — Ambulatory Visit
Admission: RE | Admit: 2023-06-22 | Discharge: 2023-06-22 | Disposition: A | Discharge status: Home / Self Care | Payer: Medicare Other | Source: Ambulatory Visit | Attending: Neurological Surgery | Admitting: Neurological Surgery

## 2023-06-22 DIAGNOSIS — M5416 Radiculopathy, lumbar region: Secondary | ICD-10-CM

## 2023-06-22 DIAGNOSIS — M5126 Other intervertebral disc displacement, lumbar region: Secondary | ICD-10-CM | POA: Diagnosis not present

## 2023-06-22 MED ORDER — GADOPICLENOL 0.5 MMOL/ML IV SOLN
10.0000 mL | Freq: Once | INTRAVENOUS | Status: AC | PRN
  Administered 2023-06-22: 10 mL via INTRAVENOUS

## 2023-06-28 DIAGNOSIS — E1122 Type 2 diabetes mellitus with diabetic chronic kidney disease: Secondary | ICD-10-CM | POA: Diagnosis not present

## 2023-07-02 ENCOUNTER — Other Ambulatory Visit: Payer: Self-pay | Admitting: Family Medicine

## 2023-07-02 DIAGNOSIS — T17998A Other foreign object in respiratory tract, part unspecified causing other injury, initial encounter: Secondary | ICD-10-CM

## 2023-07-11 DIAGNOSIS — M5416 Radiculopathy, lumbar region: Secondary | ICD-10-CM | POA: Diagnosis not present

## 2023-07-11 DIAGNOSIS — Z6833 Body mass index (BMI) 33.0-33.9, adult: Secondary | ICD-10-CM | POA: Diagnosis not present

## 2023-07-14 ENCOUNTER — Telehealth: Payer: Self-pay | Admitting: *Deleted

## 2023-07-14 NOTE — Telephone Encounter (Signed)
   Pre-operative Risk Assessment    Patient Name: James Lee  DOB: 1951/02/25 MRN: [id number]   Date of last office visit: 12/16/22 Reather Littler, NP Date of next office visit: 07/26/23 DR. AGBOR-ETANG   Request for Surgical Clearance    Procedure:  L4-5 LUMBAR MICRODISKECTOMY  Date of Surgery:  Clearance 08/07/23                                Surgeon:  DR. Marikay Alar Surgeon's Group or Practice Name:  [facility] Phone number:  [phone] ATTNErie Noe EXT 8244 Fax number:  [phone]    Type of Clearance Requested:   - Medical  - Pharmacy:  Hold Aspir[facility]     Type of Anesthesia:  General    Additional requests/questions:    Elpidio Anis   07/14/2023, 6:11 PM

## 2023-07-17 NOTE — Telephone Encounter (Signed)
Preoperative team, patient has upcoming appointment with cardiology on 07/26/2023.  Please add preoperative cardiac evaluation to appointment notes.  I will defer recommendations and cardiac evaluation to appointment.  Thank you for your help.  Thomasene Ripple. Valli Randol NP-C     07/17/2023, 11:39 AM [facility] [address] Office [phone] Fax [phone]

## 2023-07-17 NOTE — Telephone Encounter (Signed)
Preop clearance needed note has been added to upcoming appt on 2/12

## 2023-07-18 ENCOUNTER — Ambulatory Visit: Payer: Medicare Other | Admitting: Cardiology

## 2023-07-21 ENCOUNTER — Ambulatory Visit: Payer: Medicare Other | Admitting: Nurse Practitioner

## 2023-07-21 ENCOUNTER — Encounter: Payer: Self-pay | Admitting: Nurse Practitioner

## 2023-07-21 ENCOUNTER — Ambulatory Visit
Admission: RE | Admit: 2023-07-21 | Discharge: 2023-07-21 | Disposition: A | Discharge status: Home / Self Care | Payer: Medicare Other | Source: Ambulatory Visit | Attending: Nurse Practitioner | Admitting: Nurse Practitioner

## 2023-07-21 VITALS — BP 110/76 | HR 80 | Temp 97.6°F | Ht 71.0 in | Wt 247.8 lb

## 2023-07-21 DIAGNOSIS — M549 Dorsalgia, unspecified: Secondary | ICD-10-CM | POA: Diagnosis not present

## 2023-07-21 DIAGNOSIS — G4733 Obstructive sleep apnea (adult) (pediatric): Secondary | ICD-10-CM

## 2023-07-21 DIAGNOSIS — J449 Chronic obstructive pulmonary disease, unspecified: Secondary | ICD-10-CM

## 2023-07-21 DIAGNOSIS — Z720 Tobacco use: Secondary | ICD-10-CM

## 2023-07-21 DIAGNOSIS — J300 Vasomotor rhinitis: Secondary | ICD-10-CM

## 2023-07-21 DIAGNOSIS — Z01811 Encounter for preprocedural respiratory examination: Secondary | ICD-10-CM | POA: Insufficient documentation

## 2023-07-21 DIAGNOSIS — J310 Chronic rhinitis: Secondary | ICD-10-CM | POA: Insufficient documentation

## 2023-07-21 DIAGNOSIS — J4489 Other specified chronic obstructive pulmonary disease: Secondary | ICD-10-CM

## 2023-07-21 DIAGNOSIS — J439 Emphysema, unspecified: Secondary | ICD-10-CM

## 2023-07-21 NOTE — Assessment & Plan Note (Addendum)
 Awaiting L4-5 microdiskectomy 2/24. No saddle anesthesia. No new lower extremity weakness

## 2023-07-21 NOTE — Assessment & Plan Note (Addendum)
 Moderate risk. Factors that increase the risk for postoperative pulmonary complications are COPD, smoker, age, obesity, OSA. Preop CXR today  Respiratory complications generally occur in 1% of ASA Class I patients, 5% of ASA Class II and 10% of ASA Class III-IV patients These complications rarely result in mortality and include postoperative pneumonia, atelectasis, pulmonary embolism, ARDS and increased time requiring postoperative mechanical ventilation.   Overall, I recommend proceeding with the surgery if the risk for respiratory complications are outweighed by the potential benefits. This will need to be discussed between the patient and surgeon.   To reduce risks of respiratory complications, I recommend: --Pre- and post-operative incentive spirometry performed frequently while awake --Inpatient use of currently prescribed positive-pressure for OSA whenever the patient is sleeping --Short duration of surgery as much as possible and avoid paralytic if possible --OOB, encourage mobility post-op   1) RISK FOR PROLONGED MECHANICAL VENTILAION - > 48h  1A) Arozullah - Prolonged mech ventilation risk Arozullah Postperative Pulmonary Risk Score - for mech ventilation dependence >48h Usaa, Ann Surg 2000, major non-cardiac surgery) Comment Score  Type of surgery - abd ao aneurysm (27), thoracic (21), neurosurgery / upper abdominal / vascular (21), neck (11) Lumbar spine 6  Emergency Surgery - (11)  0  ALbumin  < 3 or poor nutritional state - (9) Obesity  5  BUN > 30 -  (8) 28 0  Partial or completely dependent functional status - (7)  0  COPD -  (6)  6  Age - 60 to 69 (4), > 70  (6)  6  TOTAL  23  Risk Stratifcation scores  - < 10 (0.5%), 11-19 (1.8%), 20-27 (4.2%), 28-40 (10.1%), >40 (26.6%)  4.2%

## 2023-07-21 NOTE — Progress Notes (Signed)
 @Patient  ID: James Lee, male    DOB: [birth year], 73 y.o.   MRN: [id number]  Chief Complaint  Patient presents with   Follow-up    Patient is scheduled for lower back surgery on 08/07/2023 with Dr. Alm Molt. No cough, or wheezing. Shortness of breath on exertion. Wearing CPAP. Having problems with mask.     Referring provider: Hope Merle, MD  HPI: 73 year old male, active smoker followed for OSA on CPAP and COPD.  He is a patient Dr. Jacqulyn and last seen in office 03/22/2023.  Past medical history significant for hypertension, diabetes, obesity, HLD.  TEST/EVENTS:  10/2014 PFT: FVC 88, FEV1 87, ratio 65, TLC 101, DLCOunc 87. No BD 06/15/2022 LDCT chest: ahterosclerosis. Mild emphysema with diffuse bronchial wall thickening. Few small scatterd nodules, largest 5.3 mm. Lung RADS 2.  11/08/2022 echo: EF 55-60%. GIDD. RV size and function nl.   08/30/2022: OV with Dr. Isaiah has a history of severe sleep apnea, diagnosed 20 years ago.  Using CPAP machine nightly.  Does not have a download to assess therapy.  Without his CPAP, he has very loud snoring, difficulties breathing, morning headaches and nonrefreshing sleep.  Still has some daytime tiredness despite use.  He has a diagnosis of COPD.  Uses albuterol  as needed.  Actively smoking.  Has a chronic cough with sputum production, consistent with chronic bronchitis.  Smoking cessation advised.  Will trial him on Trelegy.  Sleep study ordered to reassess severity of sleep apnea and get him a new CPAP machine.  Continue lung cancer screening CTs every year.  03/22/2023: OV with Jacci Ruberg NP for follow-up.  Since he was here last, he had sleep study that revealed severe OSA.  He was able to get a new CPAP machine.  He is currently wearing a nasal mask but wants to switch over to the fullface mask.  He does not have the proper connector device.  He is going to go by his DME company when he leaves here to see if they can help him with this.  Does feel like he  sleeps better with CPAP.  Still feels tired during the day.  Does not want any additional medications.  Feels like it is tolerable as he is retired and can nap if he wants to.  Denies any issues with drowsy driving, morning headaches or sleep parasomnia/paralysis. His breathing is feeling right the same as it was last time he was here.  He is using albuterol  1-2 times a week.  He feels like he is able to do most of the things that he wants to do.  His main complaint is his persistent daily cough.  Produces clear phlegm.  Unchanged from his last visit. He also has daily chest congestion. He denies fevers, chills, night sweats, hemoptysis, anorexia, leg swelling, orthopnea. He's still smoking.  Unable to afford Trelegy. He did have a recent syncopal episode.  He did not have any symptoms prior to the episode. He was sitting in a chair and just lost consciousness according to his wife. He was taken to the ED. He tells me everything was normal there. Note reviewed - He did not wait for full workup but basic labs and EKG were unremarkable. Denies palpitations, lightheadedness/dizziness, CP, vision changes, headaches. He has not had any recurrent episodes since. He had an echo in May that was unremarkable.  02/19/2023-03/20/2023 CPAP 15 cmH2O 30/30 days; 100% greater than 4 hours; average use 8 hours 47 minutes Leaks 95th 44.4 AHI 2.2  07/21/2023: Today - follow up/surgical clearance Patient presents today with his wife for overdue follow-up and surgical clearance.  He is scheduled to undergo L4-5 lumbar microdiscectomy with Dr. Joshua on 08/07/2023.  He has been having a lot of issues with back pain recently.  Has not been sleeping as much because of this.  Has difficulties getting comfortable.  He is also been sleeping in his recliner.  He did move his CPAP into his living room so he could still use it in the recliner.  Usage has decreased because of this.  He is also found that the nasal mask is not fitting as well.   His wife says that he is sleeping with his mouth open a lot and still snoring.  He feels like he has more sinus congestion and postnasal drip.  Mass to seems to put more pressure on his nose.  He was wearing a fullface mask before this but did not like having as much on his face.  He is open to going back to this.  Does not feel like the pressures are too low or too high.  Denies any issues with drowsy driving or sleep parasomnia/paralysis. Regarding his breathing, feels like he is overall stable.  He is not using the Wixela every day.  Does find that it helps with his stamina and chest congestion.  Still using albuterol  occasionally.  Cough has improved some.  Occasional clear phlegm.  Not noticing any wheezing. He has not had any further episodes of syncope, lightheadedness or dizziness.  06/21/2023-07/20/2023: CPAP 15 cmH2O 30/30 days; 67% >4 hr; average use 5 hr 22 min Leaks 95th 62 AHI 12.4   No Known Allergies  Immunization History  Administered Date(s) Administered   Fluad Trivalent(High Dose 65+) 05/02/2023   Influenza,inj,Quad PF,6+ Mos 03/10/2018, 02/24/2022   Influenza-Unspecified 06/13/2013   PNEUMOCOCCAL CONJUGATE-20 02/24/2022   Pfizer Covid-19 Vaccine Bivalent Booster 29yrs & up 12/26/2020, 07/12/2022   Pneumococcal-Unspecified 06/13/2014, 10/28/2016   Td 06/18/2014, 10/28/2016   Tdap 06/18/2014   Unspecified SARS-COV-2 Vaccination 07/27/2019, 08/21/2019, 03/31/2020   Zoster Recombinant(Shingrix ) 10/28/2016    Past Medical History:  Diagnosis Date   Bacterial conjunctivitis of both eyes 02/27/2022   Benign essential HTN 09/02/2014   Bronchitis 03/14/2022   Cancer of prostate (HCC) 03/08/2011   Colon cancer screening 05/04/2022   Colon polyps    Coronary artery calcification seen on CAT scan    Coronary artery calcification seen on CAT scan 09/02/2014   Diabetes mellitus without complication (HCC)    Dyslipidemia    Elevated serum creatinine 11/11/2021   Gastric  ulcer 11/29/2017   GERD (gastroesophageal reflux disease)    History of colonic polyps 05/03/2022   Hyperlipidemia    Hypertension    Lactic acidosis 11/11/2021   LBP (low back pain)    Male erectile dysfunction, unspecified 06/21/2011   NIDDM-2 with hyperglycemia 11/11/2021   Perforated bowel (HCC) 11/29/2017   Personal history of prostate cancer 08/16/2012   Prostate cancer (HCC) 2014   prostate    RBBB    RBBB 04/27/2021   Reduced libido 08/16/2012   Scrotal pain 08/16/2012   Sleep apnea    c-pap wears   Syncope 11/10/2021    Tobacco History: Social History   Tobacco Use  Smoking Status Every Day   Current packs/day: 1.50   Average packs/day: 1.5 packs/day for 50.4 years (75.5 ttl pk-yrs)   Types: Cigarettes   Start date: 03/13/2012  Smokeless Tobacco Never   Ready to quit: Not  Answered Counseling given: Not Answered   Outpatient Medications Prior to Visit  Medication Sig Dispense Refill   albuterol  (VENTOLIN  HFA) 108 (90 Base) MCG/ACT inhaler Inhale 2 puffs into the lungs every 6 (six) hours as needed for wheezing or shortness of breath. 8 g 2   amLODipine  (NORVASC ) 5 MG tablet Take 5 mg by mouth daily.     aspirin  81 MG tablet Take 1 tablet (81 mg total) by mouth daily. 30 tablet 0   chlorthalidone (HYGROTON) 50 MG tablet Take 50 mg by mouth daily.     Continuous Blood Gluc Sensor (FREESTYLE LIBRE 2 SENSOR) MISC Place 1 sensor to skin every 14 days 1 each 5   docusate sodium  (COLACE) 100 MG capsule Take 100 mg by mouth daily.     FARXIGA  10 MG TABS tablet Take 1 tablet (10 mg total) by mouth daily. 90 tablet 3   fluticasone  (FLONASE ) 50 MCG/ACT nasal spray Place 2 sprays into both nostrils daily. 16 g 6   fluticasone -salmeterol (WIXELA INHUB) 100-50 MCG/ACT AEPB Inhale 1 puff into the lungs 2 (two) times daily. (Patient taking differently: Inhale 1 puff into the lungs as needed.) 60 each 11   gabapentin  (NEURONTIN ) 300 MG capsule Take 2 capsules (600 mg total) by  mouth 3 (three) times daily. 180 capsule 0   metFORMIN  (GLUCOPHAGE -XR) 500 MG 24 hr tablet Take 2 tablets (1,000 mg total) by mouth 2 (two) times daily with a meal. 360 tablet 3   metoprolol  succinate (TOPROL -XL) 50 MG 24 hr tablet TAKE 1 TABLET(50 MG) BY MOUTH DAILY 90 tablet 2   Multiple Vitamins-Minerals (MULTIVITAMIN WITH MINERALS) tablet Take 1 tablet by mouth daily.     olmesartan  (BENICAR ) 40 MG tablet Take 40 mg by mouth daily.     oxyCODONE -acetaminophen  (PERCOCET) 10-325 MG tablet Take 1 tablet by mouth every 4 (four) hours as needed for pain. 28 tablet 0   rosuvastatin  (CRESTOR ) 10 MG tablet Take 1 tablet (10 mg total) by mouth every evening. 90 tablet 3   Semaglutide  (RYBELSUS ) 14 MG TABS Take 1 tablet (14 mg total) by mouth daily. 120 tablet 3   pantoprazole  (PROTONIX ) 40 MG tablet TAKE 1 TABLET(40 MG) BY MOUTH DAILY (Patient not taking: Reported on 07/21/2023) 90 tablet 0.   predniSONE  (DELTASONE ) 10 MG tablet 6 tablets on Day 1 , then reduce by 1 tablet daily until gone (Patient not taking: Reported on 07/21/2023) 21 tablet 0   No facility-administered medications prior to visit.     Review of Systems:   Constitutional: No weight loss or gain, night sweats, fevers, chills, or lassitude. +fatigue  HEENT: No headaches, difficulty swallowing, tooth/dental problems, or sore throat. No sneezing, itching, ear ache +nasal congestion, post nasal drip CV:  No chest pain, orthopnea, PND, swelling in lower extremities, anasarca, dizziness, palpitations, syncope Resp: +shortness of breath with exertion; productive daily cough (improved). No excess mucus or change in color of mucus. No hemoptysis. No wheezing.  No chest wall deformity GI:  No heartburn, indigestion GU: No dysuria, change in color of urine, urgency or frequency.   Skin: No rash, lesions, ulcerations MSK:  No joint pain or swelling.  +back pain Neuro: No dizziness or lightheadedness.  Psych: No depression or anxiety. Mood  stable.     Physical Exam:  BP 110/76 (BP Location: Left Arm, Patient Position: Sitting, Cuff Size: Normal)   Pulse 80   Temp 97.6 F (36.4 C) (Temporal)   Ht 5' 11 (1.803 m)  Wt 247 lb 12.8 oz (112.4 kg)   SpO2 98%   BMI 34.56 kg/m   GEN: Pleasant, interactive, well-kempt; obese; in no acute distress. HEENT:  Normocephalic and atraumatic. PERRLA. Sclera white. Nasal turbinates erythematous, moist and patent bilaterally. No rhinorrhea present. Oropharynx pink and moist, without exudate or edema. No lesions, ulcerations. Postnasal drainage NECK:  Supple w/ fair ROM. No JVD present. Thyroid  symmetrical with no goiter or nodules palpated. No lymphadenopathy.   CV: RRR, no m/r/g, no peripheral edema. Pulses intact, +2 bilaterally. No cyanosis, pallor or clubbing. PULMONARY:  Unlabored, regular breathing. Clear bilaterally A&P w/o wheezes/rales/rhonchi. No accessory muscle use.  GI: BS present and normoactive. Soft, non-tender to palpation. No organomegaly or masses detected. MSK: No erythema, warmth or tenderness. Cap refil <2 sec all extrem. No deformities or joint swelling noted.  Neuro: A/Ox3. No focal deficits noted.   Skin: Warm, no lesions or rashe Psych: Normal affect and behavior. Judgement and thought content appropriate.     Lab Results:  CBC    Component Value Date/Time   WBC 9.6 03/20/2023 1442   RBC 4.92 03/20/2023 1442   HGB 15.4 03/20/2023 1442   HGB 15.9 01/18/2023 1450   HCT 45.8 03/20/2023 1442   HCT 47.6 01/18/2023 1450   PLT 227 03/20/2023 1442   PLT 262 01/18/2023 1450   MCV 93.1 03/20/2023 1442   MCV 92 01/18/2023 1450   MCH 31.3 03/20/2023 1442   MCHC 33.6 03/20/2023 1442   RDW 12.7 03/20/2023 1442   RDW 12.9 01/18/2023 1450   LYMPHSABS 2.8 03/06/2018 0946   MONOABS 0.7 03/06/2018 0946   EOSABS 0.2 03/06/2018 0946   BASOSABS 0.1 03/06/2018 0946    BMET    Component Value Date/Time   NA 137 03/20/2023 1442   NA 140 01/18/2023 1450   K 3.5  03/20/2023 1442   CL 102 03/20/2023 1442   CO2 25 03/20/2023 1442   GLUCOSE 146 (H) 03/20/2023 1442   BUN 28 (H) 03/20/2023 1442   BUN 20 01/18/2023 1450   CREATININE 1.87 (H) 03/20/2023 1442   CALCIUM  8.9 03/20/2023 1442   GFRNONAA 38 (L) 03/20/2023 1442   GFRAA >60 03/06/2018 0946    BNP    Component Value Date/Time   BNP 20.1 11/10/2021 1343     Imaging:  MR Lumbar Spine W Wo Contrast Result Date: 07/02/2023 CLINICAL DATA:  Lumbar radiculopathy EXAM: MRI LUMBAR SPINE WITHOUT AND WITH CONTRAST TECHNIQUE: Multiplanar and multiecho pulse sequences of the lumbar spine were obtained without and with intravenous contrast. CONTRAST:  10 mL gadolinium based contrast agent COMPARISON:  09/29/2017 FINDINGS: Segmentation:  Standard. Alignment: Grade 1 retrolisthesis at L2-3 and grade 1 anterolisthesis at L5-S1 Vertebrae:  L3-4 PLIF Conus medullaris and cauda equina: Conus extends to the L1 level. Conus and cauda equina appear normal. Paraspinal and other soft tissues: Negative Disc levels: L1-L2: Normal disc space and facet joints. No spinal canal stenosis. No neural foraminal stenosis. L2-L3: Small disc bulge. Mild unchanged spinal canal stenosis. Unchanged mild right neural foraminal stenosis. L3-L4: Interval PLIF. No spinal canal stenosis. Poor visualization of the left neural foramen due to magnetic susceptibility effects. Previously there was severe stenosis here. No right neural foraminal stenosis. L4-L5: New large right subarticular disc extrusion with inferior migration and displacement of the right L5 nerve root in the lateral recess. No central spinal canal stenosis. No neural foraminal stenosis. L5-S1: Grade 1 anterolisthesis with disc uncovering. No spinal canal stenosis. Unchanged severe bilateral neural foraminal  stenosis. Visualized sacrum: Normal. IMPRESSION: 1. New large right subarticular disc extrusion at L4-L5 with inferior migration and displacement of the right L5 nerve root in  the lateral recess. 2. Interval L3-4 PLIF with limited visualization of the left neural foramen but possible residual left neural foraminal stenosis. 3. Unchanged severe bilateral L5-S1 neural foraminal stenosis. Electronically Signed   By: Franky Stanford M.D.   On: 07/02/2023 20:02    methylPREDNISolone  acetate (DEPO-MEDROL ) injection 40 mg     Date Action Dose Route User   06/12/2023 1803 Given 40 mg Intramuscular (Right Deltoid) Harriette Raisin, CMA          Latest Ref Rng & Units 10/22/2014    9:43 AM  PFT Results  FVC-Pre L 4.30   FVC-Predicted Pre % 88   FVC-Post L 4.61   FVC-Predicted Post % 94   Pre FEV1/FVC % % 75   Post FEV1/FCV % % 65   FEV1-Pre L 3.24   FEV1-Predicted Pre % 87   FEV1-Post L 3.00   DLCO uncorrected ml/min/mmHg 29.54   DLCO UNC% % 87   DLVA Predicted % 89   TLC L 7.34   TLC % Predicted % 101   RV % Predicted % 131     No results found for: NITRICOXIDE      Assessment & Plan:   Sleep apnea Severe OSA on CPAP. Decreased utilization due to sleep disruptions r/t back pain. He does appear to have worsening control and sinusitis with nasal mask. Leaks are higher. Advised him to switch back to full face mask. Can set him up for a mask fitting, should he continue to have difficulties. Encouraged him to increase usage, as tolerated. Monitor response. Aware of risks of untreated OSA. Understands proper care/use of device. Safe driving practices reviewed.   Patient Instructions  Increase use CPAP every night, minimum of 4-6 hours a night.  Change equipment as directed. Wash your tubing with warm soap and water daily, hang to dry. Wash humidifier portion weekly. Use bottled, distilled water and change daily Be aware of reduced alertness and do not drive or operate heavy machinery if experiencing this or drowsiness.  Exercise encouraged, as tolerated. Healthy weight management discussed.  Avoid or decrease alcohol consumption and medications that make  you more sleepy, if possible. Notify if persistent daytime sleepiness occurs even with consistent use of PAP therapy.   Go back to your full face mask. Try mask liners if it irritates your face.  I put in for a mask fitting if you find you're still having trouble   Continue Albuterol  inhaler 2 puffs every 6 hours as needed for shortness of breath or wheezing. Notify if symptoms persist despite rescue inhaler/neb use.  Continue Wixela 1 puff Twice daily. Brush tongue and rinse mouth afterwards. Make sure you're using this every day  Continue flonase  nasal spray 2 sprays each nostril daily Start an over the counter allergy pill daily as needed for allergies/congestion Use saline nasal spray 2-3 times a day   I'll contact the lung cancer screening program to get you rescheduled for your CT  Chest x ray today  Good luck with your surgery! Use incentive spirometer 10 times an hour while awake. Out of bed as soon as possible. Use your inhaler the day of surgery and take your rescue with you the day of.    Follow up in 8-10 weeks with Dr. Isaiah or Izetta Malachy PIETY. If symptoms do not improve or worsen, please contact office  for sooner follow up or seek emergency care.    COPD with chronic bronchitis and emphysema (HCC) Mild COPD with chronic bronchitis. Receives benefit from ICS/LABA therapy; unable to afford Trelegy previously. Encouraged him to increase usage to prescribed regimen of 1 puff Twice daily. Smoking cessation advised. Action plan in place. Encouraged to work on graded exercises post back surgery.   Tobacco use Active smoker. Smoking cessation advised. Will follow up with lung cancer screening program to reschedule his LDCT chest - missed January 2025.   Back pain Awaiting L4-5 microdiskectomy 2/24. No saddle anesthesia. No new lower extremity weakness  Preoperative respiratory examination Moderate risk. Factors that increase the risk for postoperative pulmonary complications are  COPD, smoker, age, obesity, OSA. Preop CXR today  Respiratory complications generally occur in 1% of ASA Class I patients, 5% of ASA Class II and 10% of ASA Class III-IV patients These complications rarely result in mortality and include postoperative pneumonia, atelectasis, pulmonary embolism, ARDS and increased time requiring postoperative mechanical ventilation.   Overall, I recommend proceeding with the surgery if the risk for respiratory complications are outweighed by the potential benefits. This will need to be discussed between the patient and surgeon.   To reduce risks of respiratory complications, I recommend: --Pre- and post-operative incentive spirometry performed frequently while awake --Inpatient use of currently prescribed positive-pressure for OSA whenever the patient is sleeping --Short duration of surgery as much as possible and avoid paralytic if possible --OOB, encourage mobility post-op   1) RISK FOR PROLONGED MECHANICAL VENTILAION - > 48h  1A) Arozullah - Prolonged mech ventilation risk Arozullah Postperative Pulmonary Risk Score - for mech ventilation dependence >48h Usaa, Ann Surg 2000, major non-cardiac surgery) Comment Score  Type of surgery - abd ao aneurysm (27), thoracic (21), neurosurgery / upper abdominal / vascular (21), neck (11) Lumbar spine 6  Emergency Surgery - (11)  0  ALbumin  < 3 or poor nutritional state - (9) Obesity  5  BUN > 30 -  (8) 28 0  Partial or completely dependent functional status - (7)  0  COPD -  (6)  6  Age - 60 to 69 (4), > 70  (6)  6  TOTAL  23  Risk Stratifcation scores  - < 10 (0.5%), 11-19 (1.8%), 20-27 (4.2%), 28-40 (10.1%), >40 (26.6%)  4.2%     Rhinitis Possibly CPAP induced vs allergic. Using intranasal steroid. Add on daily antihistamine and saline spray. See above    I spent 42 minutes of dedicated to the care of this patient on the date of this encounter to include pre-visit review of records,  face-to-face time with the patient discussing conditions above, post visit ordering of testing, clinical documentation with the electronic health record, making appropriate referrals as documented, and communicating necessary findings to members of the patients care team.  Comer LULLA Rouleau, NP 07/21/2023  Pt aware and understands NP's role.

## 2023-07-21 NOTE — Assessment & Plan Note (Signed)
 Active smoker. Smoking cessation advised. Will follow up with lung cancer screening program to reschedule his LDCT chest - missed January 2025.

## 2023-07-21 NOTE — Assessment & Plan Note (Signed)
 Mild COPD with chronic bronchitis. Receives benefit from ICS/LABA therapy; unable to afford Trelegy previously. Encouraged him to increase usage to prescribed regimen of 1 puff Twice daily. Smoking cessation advised. Action plan in place. Encouraged to work on graded exercises post back surgery.

## 2023-07-21 NOTE — Assessment & Plan Note (Signed)
 Severe OSA on CPAP. Decreased utilization due to sleep disruptions r/t back pain. He does appear to have worsening control and sinusitis with nasal mask. Leaks are higher. Advised him to switch back to full face mask. Can set him up for a mask fitting, should he continue to have difficulties. Encouraged him to increase usage, as tolerated. Monitor response. Aware of risks of untreated OSA. Understands proper care/use of device. Safe driving practices reviewed.   Patient Instructions  Increase use CPAP every night, minimum of 4-6 hours a night.  Change equipment as directed. Wash your tubing with warm soap and water daily, hang to dry. Wash humidifier portion weekly. Use bottled, distilled water and change daily Be aware of reduced alertness and do not drive or operate heavy machinery if experiencing this or drowsiness.  Exercise encouraged, as tolerated. Healthy weight management discussed.  Avoid or decrease alcohol consumption and medications that make you more sleepy, if possible. Notify if persistent daytime sleepiness occurs even with consistent use of PAP therapy.   Go back to your full face mask. Try mask liners if it irritates your face.  I put in for a mask fitting if you find you're still having trouble   Continue Albuterol  inhaler 2 puffs every 6 hours as needed for shortness of breath or wheezing. Notify if symptoms persist despite rescue inhaler/neb use.  Continue Wixela 1 puff Twice daily. Brush tongue and rinse mouth afterwards. Make sure you're using this every day  Continue flonase  nasal spray 2 sprays each nostril daily Start an over the counter allergy pill daily as needed for allergies/congestion Use saline nasal spray 2-3 times a day   I'll contact the lung cancer screening program to get you rescheduled for your CT  Chest x ray today  Good luck with your surgery! Use incentive spirometer 10 times an hour while awake. Out of bed as soon as possible. Use your inhaler the  day of surgery and take your rescue with you the day of.    Follow up in 8-10 weeks with Dr. Isaiah or Izetta Malachy PIETY. If symptoms do not improve or worsen, please contact office for sooner follow up or seek emergency care.

## 2023-07-21 NOTE — Patient Instructions (Signed)
 Increase use CPAP every night, minimum of 4-6 hours a night.  Change equipment as directed. Wash your tubing with warm soap and water daily, hang to dry. Wash humidifier portion weekly. Use bottled, distilled water and change daily Be aware of reduced alertness and do not drive or operate heavy machinery if experiencing this or drowsiness.  Exercise encouraged, as tolerated. Healthy weight management discussed.  Avoid or decrease alcohol consumption and medications that make you more sleepy, if possible. Notify if persistent daytime sleepiness occurs even with consistent use of PAP therapy.   Go back to your full face mask. Try mask liners if it irritates your face.  I put in for a mask fitting if you find you're still having trouble   Continue Albuterol  inhaler 2 puffs every 6 hours as needed for shortness of breath or wheezing. Notify if symptoms persist despite rescue inhaler/neb use.  Continue Wixela 1 puff Twice daily. Brush tongue and rinse mouth afterwards. Make sure you're using this every day  Continue flonase  nasal spray 2 sprays each nostril daily Start an over the counter allergy pill daily as needed for allergies/congestion Use saline nasal spray 2-3 times a day   I'll contact the lung cancer screening program to get you rescheduled for your CT  Chest x ray today  Good luck with your surgery! Use incentive spirometer 10 times an hour while awake. Out of bed as soon as possible. Use your inhaler the day of surgery and take your rescue with you the day of.    Follow up in 8-10 weeks with Dr. Isaiah or James Lee. If symptoms do not improve or worsen, please contact office for sooner follow up or seek emergency care.

## 2023-07-21 NOTE — Assessment & Plan Note (Signed)
 Possibly CPAP induced vs allergic. Using intranasal steroid. Add on daily antihistamine and saline spray. See above

## 2023-07-24 ENCOUNTER — Telehealth: Payer: Self-pay | Admitting: Acute Care

## 2023-07-24 NOTE — Telephone Encounter (Signed)
 Left VM for pt to reschedule LDCT - He cancelled appt in January 2025

## 2023-07-26 ENCOUNTER — Encounter: Payer: Self-pay | Admitting: Cardiology

## 2023-07-26 ENCOUNTER — Ambulatory Visit: Payer: Medicare Other | Attending: Cardiology | Admitting: Cardiology

## 2023-07-26 VITALS — BP 116/74 | HR 104 | Ht 71.0 in | Wt 248.0 lb

## 2023-07-26 DIAGNOSIS — F17200 Nicotine dependence, unspecified, uncomplicated: Secondary | ICD-10-CM

## 2023-07-26 DIAGNOSIS — Z01810 Encounter for preprocedural cardiovascular examination: Secondary | ICD-10-CM

## 2023-07-26 DIAGNOSIS — E782 Mixed hyperlipidemia: Secondary | ICD-10-CM

## 2023-07-26 DIAGNOSIS — I10 Essential (primary) hypertension: Secondary | ICD-10-CM | POA: Diagnosis not present

## 2023-07-26 DIAGNOSIS — I2510 Atherosclerotic heart disease of native coronary artery without angina pectoris: Secondary | ICD-10-CM | POA: Diagnosis not present

## 2023-07-26 NOTE — Patient Instructions (Signed)
Medication Instructions:   Your physician recommends that you continue on your current medications as directed. Please refer to the Current Medication list given to you today.  *If you need a refill on your cardiac medications before your next appointment, please call your pharmacy*   Lab Work:  None Ordered  If you have labs (blood work) drawn today and your tests are completely normal, you will receive your results only by: MyChart Message (if you have MyChart) OR A paper copy in the mail If you have any lab test that is abnormal or we need to change your treatment, we will call you to review the results.   Testing/Procedures:  None Ordered    Follow-Up: At [facility], you and your health needs are our priority.  As part of our continuing mission to provide you with exceptional heart care, we have created designated Provider Care Teams.  These Care Teams include your primary Cardiologist (physician) and Advanced Practice Providers (APPs -  Physician Assistants and Nurse Practitioners) who all work together to provide you with the care you need, when you need it.  We recommend signing up for the patient portal called "MyChart".  Sign up information is provided on this After Visit Summary.  MyChart is used to connect with patients for Virtual Visits (Telemedicine).  Patients are able to view lab/test results, encounter notes, upcoming appointments, etc.  Non-urgent messages can be sent to your provider as well.   To learn more about what you can do with MyChart, go to [email or web address].    Your next appointment:   12 month(s)  Provider:   You may see Debbe Odea, MD or one of the following Advanced Practice Providers on your designated Care Team:   Nicolasa Ducking, NP Eula Listen, PA-C Cadence Fransico Michael, PA-C Charlsie Quest, NP Carlos Levering, NP

## 2023-07-26 NOTE — Telephone Encounter (Signed)
Spoke to Patient several times regarding the return of PAP App for Novo- He still has App and insists he will return to MD office sometime soon.

## 2023-07-26 NOTE — Progress Notes (Signed)
Cardiology Office Note:    Date:  07/26/2023   ID:  James Lee, DOB Sep 07, 1950, MRN [id number]  PCP:  James Allan, MD   [facility] Providers Cardiologist:  James Odea, MD     Referring MD: James Allan, MD   Chief Complaint  Patient presents with   Follow-up    Patient denies new or acute cardiac problems/concerns today.  Needing cardiac clearance for scheduled for lumbar surgery on 08/07/23    History of Present Illness:    James Lee is a 73 y.o. male with a hx of CAD (LAD and RCA coronary calcifications on chest CT ), hypertension, hyperlipidemia, diabetes, current smoker x 40+ years, COPD, OSA on CPAP who presents for follow-up.  Patient denies chest pain or shortness of breath, has back pain diagnosed with a herniated disc and sciatica.  Planning on back surgery in about 2 weeks.  Compliant with medications as prescribed.  Has no current cardiac issues.  Prior notes/testing Echo 10/2022 EF 55 to 60% Chest CT lung cancer screening 06/2022 showed LAD and RCA calcifications. Lexiscan 07/2021 showed no ischemia, EF 56%   Past Medical History:  Diagnosis Date   Bacterial conjunctivitis of both eyes 02/27/2022   Benign essential HTN 09/02/2014   Bronchitis 03/14/2022   Cancer of prostate (HCC) 03/08/2011   Colon cancer screening 05/04/2022   Colon polyps    Coronary artery calcification seen on CAT scan    Coronary artery calcification seen on CAT scan 09/02/2014   Diabetes mellitus without complication (HCC)    Dyslipidemia    Elevated serum creatinine 11/11/2021   Gastric ulcer 11/29/2017   GERD (gastroesophageal reflux disease)    History of colonic polyps 05/03/2022   Hyperlipidemia    Hypertension    Lactic acidosis 11/11/2021   LBP (low back pain)    Male erectile dysfunction, unspecified 06/21/2011   NIDDM-2 with hyperglycemia 11/11/2021   Perforated bowel (HCC) 11/29/2017   Personal history of prostate cancer 08/16/2012   Prostate cancer  (HCC) 2014   prostate    RBBB    RBBB 04/27/2021   Reduced libido 08/16/2012   Scrotal pain 08/16/2012   Sleep apnea    c-pap wears   Syncope 11/10/2021    Past Surgical History:  Procedure Laterality Date   ABDOMINAL SURGERY     gastric ulcer   back sugery     1996, 2017, 2018   BACK SURGERY  1996   2017, 2018, 2019   CATARACT EXTRACTION Bilateral 2024   CATARACT EXTRACTION W/PHACO Left 09/14/2022   Procedure: CATARACT EXTRACTION PHACO AND INTRAOCULAR LENS PLACEMENT (IOC) LEFT DIABETIC 16.26 01:17.3;  Surgeon: Lockie Mola, MD;  Location: [facility];  Service: Ophthalmology;  Laterality: Left;   CATARACT EXTRACTION W/PHACO Right 09/28/2022   Procedure: CATARACT EXTRACTION PHACO AND INTRAOCULAR LENS PLACEMENT (IOC) RIGHT DIABETIC  9.93  00:49.4;  Surgeon: Lockie Mola, MD;  Location: [facility];  Service: Ophthalmology;  Laterality: Right;  Diabetic   COLONOSCOPY WITH PROPOFOL N/A 05/03/2022   Procedure: COLONOSCOPY WITH PROPOFOL;  Surgeon: James Mood, MD;  Location: [facility] END[facility]COPY;  Service: Gastroenterology;  Laterality: N/A;   COLONOSCOPY WITH PROPOFOL N/A 05/04/2022   Procedure: COLONOSCOPY WITH PROPOFOL;  Surgeon: James Mood, MD;  Location: [facility] ENDOSCOPY;  Service: Gastroenterology;  Laterality: N/A;   COLONOSCOPY WITH PROPOFOL N/A 11/02/2022   Procedure: COLONOSCOPY WITH PROPOFOL;  Surgeon: James Mood, MD;  Location: [facility] ENDOSCOPY;  Service: Gastroenterology;  Laterality: N/A;   gastric ulcers  surgery 11-28-17   INGUINAL HERNIA REPAIR     right   LAPAROSCOPY N/A 11/28/2017   Procedure: LAPAROSCOPY DIAGNOSTIC converted to open;  Surgeon: James Filler, MD;  Location: [facility] OR;  Service: General;  Laterality: N/A;   LAPAROTOMY N/A 11/28/2017   Procedure: EXPLORATORY LAPAROTOMY;  Surgeon: James Filler, MD;  Location: [facility] OR;  Service: General;  Laterality: N/A;   MENISCUS REPAIR Right    POSTERIOR CERVICAL FUSION/FORAMINOTOMY N/A  11/10/2017   Procedure: Posterior cervical fusion with lateral mass fixation Cervical Three-Seven, cervical laminectomy Cervical Three- Seven;  Surgeon: James Alert, MD;  Location: [facility] OR;  Service: Neurosurgery;  Laterality: N/A;  Posterior cervical fusion with lateral mass fixation Cervical Three-Seven, cervical laminectomy Cervical Three- Seven   PROSTATECTOMY  01/2012   TONSILLECTOMY     TOOTH EXTRACTION      Current Medications: Current Meds  Medication Sig   albuterol (VENTOLIN HFA) 108 (90 Base) MCG/ACT inhaler Inhale 2 puffs into the lungs every 6 (six) hours as needed for wheezing or shortness of breath.   amLODipine (NORVASC) 5 MG tablet Take 5 mg by mouth daily.   aspirin 81 MG tablet Take 1 tablet (81 mg total) by mouth daily.   chlorthalidone (HYGROTON) 50 MG tablet Take 50 mg by mouth daily.   Continuous Blood Gluc Sensor (FREESTYLE LIBRE 2 SENSOR) MISC Place 1 sensor to skin every 14 days   docusate sodium (COLACE) 100 MG capsule Take 100 mg by mouth daily.   FARXIGA 10 MG TABS tablet Take 1 tablet (10 mg total) by mouth daily.   fluticasone (FLONASE) 50 MCG/ACT nasal spray Place 2 sprays into both nostrils daily.   fluticasone-salmeterol (WIXELA INHUB) 100-50 MCG/ACT AEPB Inhale 1 puff into the lungs 2 (two) times daily. (Patient taking differently: Inhale 1 puff into the lungs as needed.)   gabapentin (NEURONTIN) 300 MG capsule Take 2 capsules (600 mg total) by mouth 3 (three) times daily. (Patient taking differently: Take 600 mg by mouth 2 (two) times daily.)   metFORMIN (GLUCOPHAGE-XR) 500 MG 24 hr tablet Take 2 tablets (1,000 mg total) by mouth 2 (two) times daily with a meal.   metoprolol succinate (TOPROL-XL) 50 MG 24 hr tablet TAKE 1 TABLET(50 MG) BY MOUTH DAILY   Multiple Vitamins-Minerals (MULTIVITAMIN WITH MINERALS) tablet Take 1 tablet by mouth daily.   olmesartan (BENICAR) 40 MG tablet Take 40 mg by mouth daily.   oxyCODONE-acetaminophen (PERCOCET) 10-325 MG  tablet Take 1 tablet by mouth every 4 (four) hours as needed for pain.   rosuvastatin (CRESTOR) 10 MG tablet Take 1 tablet (10 mg total) by mouth every evening.   Semaglutide (RYBELSUS) 14 MG TABS Take 1 tablet (14 mg total) by mouth daily.     Allergies:   Patient has no known allergies.   Social History   Socioeconomic History   Marital status: Married    Spouse name: Not on file   Number of children: 2   Years of education: Not on file   Highest education level: Not on file  Occupational History   Occupation: Tel Psychologist, prison and probation services: [employer]  Tobacco Use   Smoking status: Every Day    Current packs/day: 1.50    Average packs/day: 1.5 packs/day for 50.4 years (75.6 ttl pk-yrs)    Types: Cigarettes    Start date: 03/13/2012   Smokeless tobacco: Never  Vaping Use   Vaping status: Never Used  Substance and Sexual Activity   Alcohol use: Yes  Comment: occasionally   Drug use: No   Sexual activity: Not on file  Other Topics Concern   Not on file  Social History Narrative   Not on file   Social Drivers of Health   Financial Resource Strain: Low Risk  (06/12/2023)   Overall Financial Resource Strain (CARDIA)    Difficulty of Paying Living Expenses: Not hard at all  Food Insecurity: No Food Insecurity (06/12/2023)   Hunger Vital Sign    Worried About Running Out of Food in the Last Year: Never true    Ran Out of Food in the Last Year: Never true  Transportation Needs: No Transportation Needs (06/12/2023)   PRAPARE - Administrator, Civil Service (Medical): No    Lack of Transportation (Non-Medical): No  Physical Activity: Inactive (06/12/2023)   Exercise Vital Sign    Days of Exercise per Week: 0 days    Minutes of Exercise per Session: 0 min  Stress: No Stress Concern Present (06/12/2023)   [employer] of Occupational Health - Occupational Stress Questionnaire    Feeling of Stress : Only a little  Social Connections: Moderately Isolated  (06/12/2023)   Social Connection and Isolation Panel [NHANES]    Frequency of Communication with Friends and Family: More than three times a week    Frequency of Social Gatherings with Friends and Family: More than three times a week    Attends Religious Services: Never    Database administrator or Organizations: No    Attends Engineer, structural: Never    Marital Status: Married     Family History: The patient's family history includes Alcohol abuse in his mother; Coronary artery disease in his mother; Depression in his sister; Diabetes in his sister; Drug abuse in his mother; Early death in his mother; Esophageal cancer in his father; Hyperlipidemia in his mother; Hypertension in his mother; Prostate cancer (age of onset: 21) in his brother; Throat cancer in his father. There is no history of Colon cancer, Rectal cancer, or Stomach cancer.  ROS:   Please see the history of present illness.     All other systems reviewed and are negative.  EKGs/Labs/Other Studies Reviewed:    The following studies were reviewed today:  EKG Interpretation Date/Time:  Wednesday July 26 2023 10:02:15 EST Ventricular Rate:  104 PR Interval:  160 QRS Duration:  128 QT Interval:  368 QTC Calculation: 483 R Axis:   95  Text Interpretation: Sinus tachycardia Right bundle branch block Confirmed by James Lee ([id number]) on 07/26/2023 10:27:58 AM    Recent Labs: 01/18/2023: ALT 14 03/20/2023: BUN 28; Creatinine, Ser 1.87; Hemoglobin 15.4; Platelets 227; Potassium 3.5; Sodium 137  Recent Lipid Panel    Component Value Date/Time   CHOL 135 01/18/2023 1450   TRIG 375 (H) 01/18/2023 1450   HDL 32 (L) 01/18/2023 1450   CHOLHDL 4.2 01/18/2023 1450   CHOLHDL 5 02/24/2022 1048   VLDL 68.4 (H) 02/24/2022 1048   LDLCALC 47 01/18/2023 1450   LDLDIRECT 80.0 02/24/2022 1048     Risk Assessment/Calculations:               Physical Exam:    VS:  BP 116/74 (BP Location: Left Arm,  Patient Position: Sitting)   Pulse (!) 104   Ht 5\' 11"  (1.803 m)   Wt 248 lb (112.5 kg)   SpO2 93%   BMI 34.59 kg/m     Wt Readings from Last 3 Encounters:  07/26/23 248  lb (112.5 kg)  07/21/23 247 lb 12.8 oz (112.4 kg)  06/12/23 245 lb (111.1 kg)     GEN:  Well nourished, well developed in no acute distress HEENT: Normal NECK: No JVD; No carotid bruits CARDIAC: RRR, no murmurs, rubs, gallops RESPIRATORY:  Clear to auscultation without rales, wheezing or rhonchi  ABDOMEN: Soft, non-tender, non-distended MUSCULOSKELETAL: Trace edema; No deformity  SKIN: Warm and dry NEUROLOGIC:  Lee and oriented x 3 PSYCHIATRIC:  Normal affect   ASSESSMENT:    1. Pre-procedural cardiovascular examination   2. Coronary artery disease involving native coronary artery of native heart, unspecified whether angina present   3. Primary hypertension   4. Mixed hyperlipidemia   5. Smoking    PLAN:    In order of problems listed above:  Preprocedural exam, lumbar surgery being planned.  Denies chest pain.  Last echo normal EF, prior Myoview with no ischemia.  Okay for procedure from a cardiac perspective.  No additional cardiac testing indicated at this time. CAD, LAD and RCA coronary calcifications.  Continue aspirin, Crestor 10 mg daily.  Echo 5/24 EF 55 to 60%, Hypertension, BP controlled.  Continue Norvasc 5 mg daily, chlorthalidone, Toprol-XL 50 mg daily. Hyperlipidemia, LDL at goal.  Triglycerides elevated, continue with low-calorie diet, weight loss should improve triglycerides.  Continue Crestor 10 mg daily. Current smoker, smoking cessation again advised.  Follow-up in 12 months.      Medication Adjustments/Labs and Tests Ordered: Current medicines are reviewed at length with the patient today.  Concerns regarding medicines are outlined above.  Orders Placed This Encounter  Procedures   EKG 12-Lead   No orders of the defined types were placed in this encounter.   Patient  Instructions  Medication Instructions:   Your physician recommends that you continue on your current medications as directed. Please refer to the Current Medication list given to you today.  *If you need a refill on your cardiac medications before your next appointment, please call your pharmacy*   Lab Work:  None Ordered  If you have labs (blood work) drawn today and your tests are completely normal, you will receive your results only by: MyChart Message (if you have MyChart) OR A paper copy in the mail If you have any lab test that is abnormal or we need to change your treatment, we will call you to review the results.   Testing/Procedures:  None Ordered   Follow-Up: At [facility], you and your health needs are our priority.  As part of our continuing mission to provide you with exceptional heart care, we have created designated Provider Care Teams.  These Care Teams include your primary Cardiologist (physician) and Advanced Practice Providers (APPs -  Physician Assistants and Nurse Practitioners) who all work together to provide you with the care you need, when you need it.  We recommend signing up for the patient portal called "MyChart".  Sign up information is provided on this After Visit Summary.  MyChart is used to connect with patients for Virtual Visits (Telemedicine).  Patients are able to view lab/test results, encounter notes, upcoming appointments, etc.  Non-urgent messages can be sent to your provider as well.   To learn more about what you can do with MyChart, go to [email or web address].    Your next appointment:   12 month(s)  Provider:   You may see James Odea, MD or one of the following Advanced Practice Providers on your designated Care Team:   Nicolasa Ducking, NP Eula Listen,  PA-C Cadence Fransico Michael, PA-C Charlsie Quest, NP Carlos Levering, NP    Signed, James Odea, MD  07/26/2023 11:23 AM    [facility]

## 2023-07-28 DIAGNOSIS — E1122 Type 2 diabetes mellitus with diabetic chronic kidney disease: Secondary | ICD-10-CM | POA: Diagnosis not present

## 2023-07-29 ENCOUNTER — Other Ambulatory Visit: Payer: Self-pay | Admitting: Internal Medicine

## 2023-08-01 ENCOUNTER — Telehealth: Payer: Self-pay | Admitting: Family Medicine

## 2023-08-01 NOTE — Telephone Encounter (Signed)
Pt wife drop off forms to be completed forms will be placed in Dr. Clent Ridges folder. Thanks

## 2023-08-02 ENCOUNTER — Other Ambulatory Visit (HOSPITAL_BASED_OUTPATIENT_CLINIC_OR_DEPARTMENT_OTHER): Payer: Medicare Other

## 2023-08-03 NOTE — Telephone Encounter (Signed)
Forms faxed, made copies,and placed in [employer]

## 2023-08-07 DIAGNOSIS — M5116 Intervertebral disc disorders with radiculopathy, lumbar region: Secondary | ICD-10-CM | POA: Diagnosis not present

## 2023-08-07 DIAGNOSIS — M21371 Foot drop, right foot: Secondary | ICD-10-CM | POA: Diagnosis not present

## 2023-08-07 DIAGNOSIS — M5416 Radiculopathy, lumbar region: Secondary | ICD-10-CM | POA: Diagnosis not present

## 2023-08-07 DIAGNOSIS — M48061 Spinal stenosis, lumbar region without neurogenic claudication: Secondary | ICD-10-CM | POA: Diagnosis not present

## 2023-08-17 ENCOUNTER — Ambulatory Visit: Payer: Medicare Other | Admitting: Podiatry

## 2023-08-17 ENCOUNTER — Encounter: Payer: Self-pay | Admitting: Podiatry

## 2023-08-17 DIAGNOSIS — M79675 Pain in left toe(s): Secondary | ICD-10-CM | POA: Diagnosis not present

## 2023-08-17 DIAGNOSIS — Z0189 Encounter for other specified special examinations: Secondary | ICD-10-CM

## 2023-08-17 DIAGNOSIS — E119 Type 2 diabetes mellitus without complications: Secondary | ICD-10-CM | POA: Diagnosis not present

## 2023-08-17 DIAGNOSIS — M2042 Other hammer toe(s) (acquired), left foot: Secondary | ICD-10-CM

## 2023-08-17 DIAGNOSIS — M2041 Other hammer toe(s) (acquired), right foot: Secondary | ICD-10-CM

## 2023-08-17 DIAGNOSIS — M79674 Pain in right toe(s): Secondary | ICD-10-CM | POA: Diagnosis not present

## 2023-08-17 DIAGNOSIS — B351 Tinea unguium: Secondary | ICD-10-CM

## 2023-08-17 DIAGNOSIS — L84 Corns and callosities: Secondary | ICD-10-CM | POA: Diagnosis not present

## 2023-08-17 DIAGNOSIS — E1142 Type 2 diabetes mellitus with diabetic polyneuropathy: Secondary | ICD-10-CM | POA: Diagnosis not present

## 2023-08-17 DIAGNOSIS — K080 Exfoliation of teeth due to systemic causes: Secondary | ICD-10-CM | POA: Diagnosis not present

## 2023-08-17 DIAGNOSIS — M2030 Hallux varus (acquired), unspecified foot: Secondary | ICD-10-CM | POA: Diagnosis not present

## 2023-08-21 DIAGNOSIS — N1832 Chronic kidney disease, stage 3b: Secondary | ICD-10-CM | POA: Diagnosis not present

## 2023-08-22 NOTE — Progress Notes (Signed)
 ANNUAL DIABETIC FOOT EXAM  Subjective: James Lee presents today for annual diabetic foot exam.  Patient confirms h/o diabetes.  Patient denies any h/o foot wounds.  Patient has been diagnosed with neuropathy.  Dana Allan, MD is patient's PCP. LOV 05/02/2023.  Past Medical History:  Diagnosis Date   Bacterial conjunctivitis of both eyes 02/27/2022   Benign essential HTN 09/02/2014   Bronchitis 03/14/2022   Cancer of prostate (HCC) 03/08/2011   Colon cancer screening 05/04/2022   Colon polyps    Coronary artery calcification seen on CAT scan    Coronary artery calcification seen on CAT scan 09/02/2014   Diabetes mellitus without complication (HCC)    Dyslipidemia    Elevated serum creatinine 11/11/2021   Gastric ulcer 11/29/2017   GERD (gastroesophageal reflux disease)    History of colonic polyps 05/03/2022   Hyperlipidemia    Hypertension    Lactic acidosis 11/11/2021   LBP (low back pain)    Male erectile dysfunction, unspecified 06/21/2011   NIDDM-2 with hyperglycemia 11/11/2021   Perforated bowel (HCC) 11/29/2017   Personal history of prostate cancer 08/16/2012   Prostate cancer (HCC) 2014   prostate    RBBB    RBBB 04/27/2021   Reduced libido 08/16/2012   Scrotal pain 08/16/2012   Sleep apnea    c-pap wears   Syncope 11/10/2021   Patient Active Problem List   Diagnosis Date Noted   Back pain 07/21/2023   Preoperative respiratory examination 07/21/2023   Rhinitis 07/21/2023   Sciatica of right side associated with disorder of lumbar spine 06/13/2023   Lumbar radiculopathy 06/10/2023   Aspiration of liquid 05/11/2023   Need for influenza vaccination 05/11/2023   Post-nasal drip 05/11/2023   Skin lesion 11/07/2022   History of colonic polyps 11/02/2022   Pain due to onychomycosis of toenails of both feet 08/04/2022   Genetic testing 07/29/2022   Mutation in HOXB13 gene 07/29/2022   Aortic atherosclerosis (HCC) 06/17/2022   COPD with chronic  bronchitis and emphysema (HCC) 06/17/2022   Diabetic eye exam (HCC) 06/08/2022   Adenomatous polyp of colon 05/04/2022   Hypertension associated with diabetes (HCC) 02/27/2022   Type 2 diabetes mellitus with diabetic chronic kidney disease (HCC) 02/27/2022   Tobacco use 02/27/2022   Syncope 11/10/2021   Class 2 obesity with body mass index (BMI) of 35.0 to 35.9 in adult 01/07/2021   Diabetic neuropathy (HCC) 11/30/2020   Hallux malleus 11/30/2020   S/P lumbar spinal fusion 03/09/2018   De Quervain's syndrome (tenosynovitis) 02/05/2018   Cervical vertebral fusion 11/10/2017   Hyperreflexia 10/10/2017   Trigger index finger of right hand 09/27/2017   Displacement of lumbar intervertebral disc without myelopathy 11/03/2015   Spondylolisthesis at L5-S1 level 11/03/2015   Hyperlipidemia associated with type 2 diabetes mellitus (HCC) 09/02/2014   Sleep apnea    Pulmonary nodules 09/01/2014   DOE (dyspnea on exertion) 09/01/2014   Vitamin B12 deficiency (non anemic) 11/06/2012   Urethral stricture 03/09/2012   Past Surgical History:  Procedure Laterality Date   ABDOMINAL SURGERY     gastric ulcer   back sugery     1996, 2017, 2018   BACK SURGERY  1996   2017, 2018, 2019   CATARACT EXTRACTION Bilateral 2024   CATARACT EXTRACTION W/PHACO Left 09/14/2022   Procedure: CATARACT EXTRACTION PHACO AND INTRAOCULAR LENS PLACEMENT (IOC) LEFT DIABETIC 16.26 01:17.3;  Surgeon: Lockie Mola, MD;  Location: [facility];  Service: Ophthalmology;  Laterality: Left;   CATARACT EXTRACTION W/PHACO  Right 09/28/2022   Procedure: CATARACT EXTRACTION PHACO AND INTRAOCULAR LENS PLACEMENT (IOC) RIGHT DIABETIC  9.93  00:49.4;  Surgeon: Lockie Mola, MD;  Location: [facility];  Service: Ophthalmology;  Laterality: Right;  Diabetic   COLONOSCOPY WITH PROPOFOL N/A 05/03/2022   Procedure: COLONOSCOPY WITH PROPOFOL;  Surgeon: Wyline Mood, MD;  Location: [facility];  Service:  Gastroenterology;  Laterality: N/A;   COLONOSCOPY WITH PROPOFOL N/A 05/04/2022   Procedure: COLONOSCOPY WITH PROPOFOL;  Surgeon: Wyline Mood, MD;  Location: [facility] ENDOSCOPY;  Service: Gastroenterology;  Laterality: N/A;   COLONOSCOPY WITH PROPOFOL N/A 11/02/2022   Procedure: COLONOSCOPY WITH PROPOFOL;  Surgeon: Wyline Mood, MD;  Location: [facility] ENDOSCOPY;  Service: Gastroenterology;  Laterality: N/A;   gastric ulcers     surgery 11-28-17   INGUINAL HERNIA REPAIR     right   LAPAROSCOPY N/A 11/28/2017   Procedure: LAPAROSCOPY DIAGNOSTIC converted to open;  Surgeon: Axel Filler, MD;  Location: [facility] OR;  Service: General;  Laterality: N/A;   LAPAROTOMY N/A 11/28/2017   Procedure: EXPLORATORY LAPAROTOMY;  Surgeon: Axel Filler, MD;  Location: [facility] OR;  Service: General;  Laterality: N/A;   MENISCUS REPAIR Right    POSTERIOR CERVICAL FUSION/FORAMINOTOMY N/A 11/10/2017   Procedure: Posterior cervical fusion with lateral mass fixation Cervical Three-Seven, cervical laminectomy Cervical Three- Seven;  Surgeon: Tia Alert, MD;  Location: [facility] OR;  Service: Neurosurgery;  Laterality: N/A;  Posterior cervical fusion with lateral mass fixation Cervical Three-Seven, cervical laminectomy Cervical Three- Seven   PROSTATECTOMY  01/2012   TONSILLECTOMY     TOOTH EXTRACTION     Current Outpatient Medications on File Prior to Visit  Medication Sig Dispense Refill   albuterol (VENTOLIN HFA) 108 (90 Base) MCG/ACT inhaler Inhale 2 puffs into the lungs every 6 (six) hours as needed for wheezing or shortness of breath. 8 g 2   amLODipine (NORVASC) 5 MG tablet Take 5 mg by mouth daily.     aspirin 81 MG tablet Take 1 tablet (81 mg total) by mouth daily. 30 tablet 0   chlorthalidone (HYGROTON) 50 MG tablet Take 50 mg by mouth daily.     Continuous Blood Gluc Sensor (FREESTYLE LIBRE 2 SENSOR) MISC Place 1 sensor to skin every 14 days 1 each 5   docusate sodium (COLACE) 100 MG capsule Take 100 mg by mouth daily.      FARXIGA 10 MG TABS tablet Take 1 tablet (10 mg total) by mouth daily. 90 tablet 3   fluticasone (FLONASE) 50 MCG/ACT nasal spray Place 2 sprays into both nostrils daily. 16 g 6   fluticasone-salmeterol (WIXELA INHUB) 100-50 MCG/ACT AEPB Inhale 1 puff into the lungs 2 (two) times daily. (Patient taking differently: Inhale 1 puff into the lungs as needed.) 60 each 11   gabapentin (NEURONTIN) 300 MG capsule Take 2 capsules (600 mg total) by mouth 3 (three) times daily. 540 capsule 0   metFORMIN (GLUCOPHAGE-XR) 500 MG 24 hr tablet Take 2 tablets (1,000 mg total) by mouth 2 (two) times daily with a meal. 360 tablet 3   metoprolol succinate (TOPROL-XL) 50 MG 24 hr tablet TAKE 1 TABLET(50 MG) BY MOUTH DAILY 90 tablet 2   Multiple Vitamins-Minerals (MULTIVITAMIN WITH MINERALS) tablet Take 1 tablet by mouth daily.     olmesartan (BENICAR) 40 MG tablet Take 40 mg by mouth daily.     oxyCODONE-acetaminophen (PERCOCET) 10-325 MG tablet Take 1 tablet by mouth every 4 (four) hours as needed for pain. 28 tablet 0   rosuvastatin (  CRESTOR) 10 MG tablet Take 1 tablet (10 mg total) by mouth every evening. 90 tablet 3   Semaglutide (RYBELSUS) 14 MG TABS Take 1 tablet (14 mg total) by mouth daily. 120 tablet 3   No current facility-administered medications on file prior to visit.    No Known Allergies Social History   Occupational History   Occupation: Tel Psychologist, prison and probation services: [employer]  Tobacco Use   Smoking status: Every Day    Current packs/day: 1.50    Average packs/day: 1.5 packs/day for 50.4 years (75.7 ttl pk-yrs)    Types: Cigarettes    Start date: 03/13/2012   Smokeless tobacco: Never  Vaping Use   Vaping status: Never Used  Substance and Sexual Activity   Alcohol use: Yes    Comment: occasionally   Drug use: No   Sexual activity: Not on file   Family History  Problem Relation Age of Onset   Hypertension Mother    Hyperlipidemia Mother    Early death Mother    Drug abuse Mother     Alcohol abuse Mother    Coronary artery disease Mother    Throat cancer Father    Esophageal cancer Father        dx late 70s, d. 84   Diabetes Sister    Depression Sister    Prostate cancer Brother 71   Colon cancer Neg Hx    Rectal cancer Neg Hx    Stomach cancer Neg Hx    Immunization History  Administered Date(s) Administered   Fluad Trivalent(High Dose 65+) 05/02/2023   Influenza,inj,Quad PF,6+ Mos 03/10/2018, 02/24/2022   Influenza-Unspecified 06/13/2013   PNEUMOCOCCAL CONJUGATE-20 02/24/2022   Pfizer Covid-19 Vaccine Bivalent Booster 30yrs & up 12/26/2020, 07/12/2022   Pneumococcal-Unspecified 06/13/2014, 10/28/2016   Td 06/18/2014, 10/28/2016   Tdap 06/18/2014   Unspecified SARS-COV-2 Vaccination 07/27/2019, 08/21/2019, 03/31/2020   Zoster Recombinant(Shingrix) 10/28/2016     Review of Systems: Negative except as noted in the HPI.   Objective: There were no vitals filed for this visit.  Jedediah Noda is a pleasant 73 y.o. male in NAD. AAO X 3.  Diabetic foot exam was performed with the following findings:   Vascular Examination: Capillary refill time immediate b/l. Vascular status intact b/l with palpable pedal pulses. Pedal hair present b/l. No pain with calf compression b/l. Skin temperature gradient WNL b/l. No cyanosis or clubbing b/l. No ischemia or gangrene noted b/l.   Neurological Examination: Vibratory sensation intact b/l. Protective sensation diminished with 10g monofilament b/l.  Dermatological Examination: Pedal skin with normal turgor, texture and tone b/l.  No open wounds. No interdigital macerations.   Toenails 1-5 b/l thick, discolored, elongated with subungual debris and pain on dorsal palpation.   Hyperkeratotic lesion(s) R 2nd toe.  No erythema, no edema, no drainage, no fluctuance.  Musculoskeletal Examination: Muscle strength 5/5 to all lower extremity muscle groups bilaterally. Hallux hammertoe noted b/l LE. Hammertoe(s) R 2nd  toe.  Radiographs: None     Lab Results  Component Value Date   HGBA1C 7.3 (A) 05/02/2023   ADA Risk Categorization: High Risk  Patient has one or more of the following: Loss of protective sensation Absent pedal pulses Severe Foot deformity History of foot ulcer  Assessment: 1. Pain due to onychomycosis of toenails of both feet   2. Corns   3. Hallux malleus, unspecified laterality   4. Acquired hammertoes of both feet   5. Diabetic polyneuropathy associated with type 2 diabetes mellitus (HCC)  6. Encounter for diabetic foot exam (HCC)     Plan: Orders Placed This Encounter  Procedures   For Home Use Only DME Diabetic Shoe    Dispesne one pair extra depth shoes and 3 pair total contact insoles.    Diabetic foot examination performed today.  Order placed for diabetic shoes. All patient's and/or POA's questions/concerns addressed on today's visit. Toenails 1-5 debrided in length and girth without incident. Corn(s) and Callus(es) R 2nd toe pared with sharp debridement without incident. Continue daily foot inspections and monitor blood glucose per PCP/Endocrinologist's recommendations. Continue soft, supportive shoe gear daily. Report any pedal injuries to medical professional. Call office if there are any questions/concerns. -Patient/POA to call should there be question/concern in the interim. Return in about 3 months (around 11/17/2023).  Freddie Breech, DPM      [facility] LOCATION: [address], [place] [address]                   Office [phone]   [facility] LOCATION: [address] [place], [place] [address] Office [phone]

## 2023-08-28 DIAGNOSIS — I1A0 Resistant hypertension: Secondary | ICD-10-CM | POA: Diagnosis not present

## 2023-08-28 DIAGNOSIS — R809 Proteinuria, unspecified: Secondary | ICD-10-CM | POA: Diagnosis not present

## 2023-08-28 DIAGNOSIS — N1832 Chronic kidney disease, stage 3b: Secondary | ICD-10-CM | POA: Diagnosis not present

## 2023-08-28 DIAGNOSIS — E1129 Type 2 diabetes mellitus with other diabetic kidney complication: Secondary | ICD-10-CM | POA: Diagnosis not present

## 2023-08-28 DIAGNOSIS — E1122 Type 2 diabetes mellitus with diabetic chronic kidney disease: Secondary | ICD-10-CM | POA: Diagnosis not present

## 2023-09-05 ENCOUNTER — Encounter: Payer: Self-pay | Admitting: Pharmacist

## 2023-09-05 NOTE — Progress Notes (Signed)
 Patient Assistance Program (PAP) Application   Manufacturer: [employer] Nordisk   Medication(s): Rybelsus 14 mg  Managed by [facility] Team  Patient Pages: Faxed by [facility] 08/03/23 Provider Pages: Unclear   Called [employer] - Confirmed "missing information"   Full application placed in PCP inbox (patient pages + filled out HCP form)  [employer] and [facility] Med [facility]ist teams  cc'd

## 2023-09-06 ENCOUNTER — Other Ambulatory Visit (HOSPITAL_BASED_OUTPATIENT_CLINIC_OR_DEPARTMENT_OTHER): Payer: Medicare Other

## 2023-09-06 ENCOUNTER — Telehealth: Payer: Self-pay

## 2023-09-06 NOTE — Telephone Encounter (Signed)
 PAP: Application for Rybelsus has been submitted to [employer], via fax

## 2023-09-06 NOTE — Progress Notes (Signed)
 Form faxed back, copy made to place in chart, and original place in [place] box.

## 2023-09-11 DIAGNOSIS — G4733 Obstructive sleep apnea (adult) (pediatric): Secondary | ICD-10-CM | POA: Diagnosis not present

## 2023-09-12 NOTE — Telephone Encounter (Signed)
 [employer] Nordisk to check Status of PAP and a proof of income document is needed to further process his application. Patient is aware.

## 2023-09-14 ENCOUNTER — Telehealth: Payer: Self-pay

## 2023-09-14 ENCOUNTER — Other Ambulatory Visit: Payer: Self-pay

## 2023-09-14 DIAGNOSIS — N1832 Chronic kidney disease, stage 3b: Secondary | ICD-10-CM

## 2023-09-14 NOTE — Telephone Encounter (Signed)
 Left message to return call to our office.  Need to know which pharmacy the RX need to go to.

## 2023-09-14 NOTE — Telephone Encounter (Signed)
 Copied from CRM [id number]. Topic: Clinical - Prescription Issue >> Sep 14, 2023 10:19 AM Kathryne Eriksson wrote: Reason for CRM: Continuous Blood Gluc Sensor (FREESTYLE LIBRE 2 SENSOR) MISC >> Sep 14, 2023 10:19 AM Kathryne Eriksson wrote: Patient states his provider [facility] is needing a new order in regards to this prescription.

## 2023-09-15 MED ORDER — FREESTYLE LIBRE 2 SENSOR MISC: 5 refills | Status: AC

## 2023-09-15 NOTE — Telephone Encounter (Signed)
 Spoke to pt and notified him rx would be sent in.

## 2023-09-26 DIAGNOSIS — E1165 Type 2 diabetes mellitus with hyperglycemia: Secondary | ICD-10-CM | POA: Diagnosis not present

## 2023-10-05 ENCOUNTER — Telehealth: Payer: Self-pay | Admitting: Podiatry

## 2023-10-05 NOTE — Telephone Encounter (Signed)
 Patient has yet to provide tax document to [employer] or to Provider's office to complete processing of application for Rybelsus . L Zink , RPH is aware.

## 2023-10-05 NOTE — Telephone Encounter (Signed)
 Patient needs prescription sent to [facility] for diabetic shoes.

## 2023-10-09 ENCOUNTER — Other Ambulatory Visit (INDEPENDENT_AMBULATORY_CARE_PROVIDER_SITE_OTHER): Payer: Self-pay | Admitting: Podiatry

## 2023-10-09 DIAGNOSIS — M2042 Other hammer toe(s) (acquired), left foot: Secondary | ICD-10-CM

## 2023-10-09 DIAGNOSIS — E1142 Type 2 diabetes mellitus with diabetic polyneuropathy: Secondary | ICD-10-CM

## 2023-10-09 DIAGNOSIS — M2041 Other hammer toe(s) (acquired), right foot: Secondary | ICD-10-CM

## 2023-10-09 DIAGNOSIS — M2030 Hallux varus (acquired), unspecified foot: Secondary | ICD-10-CM

## 2023-10-09 NOTE — Progress Notes (Signed)
 1. Diabetic polyneuropathy associated with type 2 diabetes mellitus (HCC)   2. Acquired hammertoes of both feet   3. Hallux malleus, unspecified laterality    Orders Placed This Encounter  Procedures   For Home Use Only DME Diabetic Shoe    Dispense one pair extra depth shoes and 3 pair total contact insoles.  To be performed at [facility] [address] [place], [place] [address] Fax: [phone]   10/09/2023 Luella Sager, DPM NPI: [id number]

## 2023-10-11 DIAGNOSIS — G4733 Obstructive sleep apnea (adult) (pediatric): Secondary | ICD-10-CM | POA: Diagnosis not present

## 2023-10-13 ENCOUNTER — Other Ambulatory Visit: Payer: Self-pay

## 2023-10-13 DIAGNOSIS — J449 Chronic obstructive pulmonary disease, unspecified: Secondary | ICD-10-CM

## 2023-10-20 ENCOUNTER — Ambulatory Visit: Admitting: Nurse Practitioner

## 2023-10-20 ENCOUNTER — Encounter: Payer: Self-pay | Admitting: Nurse Practitioner

## 2023-10-20 ENCOUNTER — Ambulatory Visit: Admitting: Internal Medicine

## 2023-10-20 VITALS — BP 120/80 | HR 62 | Temp 98.2°F | Ht 71.0 in | Wt 251.6 lb

## 2023-10-20 DIAGNOSIS — Z9981 Dependence on supplemental oxygen: Secondary | ICD-10-CM | POA: Diagnosis not present

## 2023-10-20 DIAGNOSIS — J449 Chronic obstructive pulmonary disease, unspecified: Secondary | ICD-10-CM

## 2023-10-20 DIAGNOSIS — Z720 Tobacco use: Secondary | ICD-10-CM

## 2023-10-20 DIAGNOSIS — G4733 Obstructive sleep apnea (adult) (pediatric): Secondary | ICD-10-CM | POA: Diagnosis not present

## 2023-10-20 DIAGNOSIS — J4489 Other specified chronic obstructive pulmonary disease: Secondary | ICD-10-CM

## 2023-10-20 DIAGNOSIS — J439 Emphysema, unspecified: Secondary | ICD-10-CM | POA: Diagnosis not present

## 2023-10-20 DIAGNOSIS — F17210 Nicotine dependence, cigarettes, uncomplicated: Secondary | ICD-10-CM | POA: Diagnosis not present

## 2023-10-20 LAB — PULMONARY FUNCTION TEST
DL/VA % pred: 81 %
DL/VA: 3.23 ml/min/mmHg/L
DLCO unc % pred: 73 %
DLCO unc: 19.41 ml/min/mmHg
FEF 25-75 Post: 1.23 L/s
FEF 25-75 Pre: 0.61 L/s
FEF2575-%Change-Post: 99 %
FEF2575-%Pred-Post: 47 %
FEF2575-%Pred-Pre: 23 %
FEV1-%Change-Post: 23 %
FEV1-%Pred-Post: 53 %
FEV1-%Pred-Pre: 43 %
FEV1-Post: 1.81 L
FEV1-Pre: 1.47 L
FEV1FVC-%Change-Post: 0 %
FEV1FVC-%Pred-Pre: 77 %
FEV6-%Change-Post: 20 %
FEV6-%Pred-Post: 70 %
FEV6-%Pred-Pre: 58 %
FEV6-Post: 3.07 L
FEV6-Pre: 2.54 L
FEV6FVC-%Change-Post: -1 %
FEV6FVC-%Pred-Post: 103 %
FEV6FVC-%Pred-Pre: 104 %
FVC-%Change-Post: 22 %
FVC-%Pred-Post: 68 %
FVC-%Pred-Pre: 55 %
FVC-Post: 3.15 L
FVC-Pre: 2.57 L
Post FEV1/FVC ratio: 58 %
Post FEV6/FVC ratio: 97 %
Pre FEV1/FVC ratio: 57 %
Pre FEV6/FVC Ratio: 99 %
RV % pred: 165 %
RV: 4.15 L
TLC % pred: 108 %
TLC: 7.83 L

## 2023-10-20 NOTE — Assessment & Plan Note (Signed)
 Continues to smoke. See above. Provided with number for lung cancer screening program to reschedule his CT chest as he is overdue.

## 2023-10-20 NOTE — Patient Instructions (Addendum)
 Continue CPAP every night, minimum of 4-6 hours a night.  Change equipment as directed. Wash your tubing with warm soap and water daily, hang to dry. Wash humidifier portion weekly. Use bottled, distilled water and change daily Be aware of reduced alertness and do not drive or operate heavy machinery if experiencing this or drowsiness.  Exercise encouraged, as tolerated. Healthy weight management discussed.  Avoid or decrease alcohol consumption and medications that make you more sleepy, if possible. Notify if persistent daytime sleepiness occurs even with consistent use of PAP therapy.   Adjusted CPAP settings to 10-15 auto set. Let me know if this doesn't work for you   Continue Albuterol  inhaler 2 puffs every 6 hours as needed for shortness of breath or wheezing. Notify if symptoms persist despite rescue inhaler/neb use.  Increase use of Wixela 1 puff Twice daily. Brush tongue and rinse mouth afterwards. Make sure you're using this every day  Continue flonase  nasal spray 2 sprays each nostril daily Use saline nasal spray 2-3 times a day    Your lung function testing shows decrease in your lung function compared to prior. You have moderately severe COPD now. You do have good response to the albuterol . Need to work on quitting smoking, best of your ability as this is causing your disease to progress faster   Call the lung cancer screening program to schedule your CT scan - [phone]   Follow up in 4 months with Dr. Auston Left or Gina Lagos. If symptoms do not improve or worsen, please contact office for sooner follow up or seek emergency care.

## 2023-10-20 NOTE — Patient Instructions (Signed)
 Full PFT completed today ? ?

## 2023-10-20 NOTE — Assessment & Plan Note (Signed)
 Moderately severe COPD with reversibility on PFTs today. Decline when compared to prior 9 years ago. Discussed disease progression and prognosis. Educated on impact of continued smoking and smoking cessation strongly advised. Encouraged him to utilize [place] on a scheduled basis to help with maintenance and assess if this reduces SABA use/improves stamina. Action plan in place.  Patient Instructions  Continue CPAP every night, minimum of 4-6 hours a night.  Change equipment as directed. Wash your tubing with warm soap and water daily, hang to dry. Wash humidifier portion weekly. Use bottled, distilled water and change daily Be aware of reduced alertness and do not drive or operate heavy machinery if experiencing this or drowsiness.  Exercise encouraged, as tolerated. Healthy weight management discussed.  Avoid or decrease alcohol consumption and medications that make you more sleepy, if possible. Notify if persistent daytime sleepiness occurs even with consistent use of PAP therapy.   Adjusted CPAP settings to 10-15 auto set. Let me know if this doesn't work for you   Continue Albuterol  inhaler 2 puffs every 6 hours as needed for shortness of breath or wheezing. Notify if symptoms persist despite rescue inhaler/neb use.  Increase use of Wixela 1 puff Twice daily. Brush tongue and rinse mouth afterwards. Make sure you're using this every day  Continue flonase  nasal spray 2 sprays each nostril daily Use saline nasal spray 2-3 times a day    Your lung function testing shows decrease in your lung function compared to prior. You have moderately severe COPD now. You do have good response to the albuterol . Need to work on quitting smoking, best of your ability as this is causing your disease to progress faster   Call the lung cancer screening program to schedule your CT scan - [phone]   Follow up in 4 months with Dr. Auston Left or Gina Lagos. If symptoms do not improve or worsen, please contact  office for sooner follow up or seek emergency care.

## 2023-10-20 NOTE — Progress Notes (Signed)
 Full PFT completed today ? ?

## 2023-10-20 NOTE — Assessment & Plan Note (Signed)
 Severe OSA on CPAP. Excellent compliance and control. Receives benefit from use. Aware of proper care/use of device. Healthy weight loss encouraged. Safe driving practices reviewed.  He does continue to have some difficulties with rhinitis despite nasal sprays. He prefers nasal mask over the full face. Possible his pressures are too high. Will adjust him to an auto setting 10-15 cmH2O and reassess response.

## 2023-10-20 NOTE — Progress Notes (Signed)
 @Patient  ID: James Lee, male    DOB: Jun 10, 1951, 73 y.o.   MRN: [id number]  No chief complaint on file.   Referring provider: Roetta Clarke, NP  HPI: 73 year old male, active smoker followed for OSA on CPAP and COPD.  He is a patient Dr. Landa Pine and last seen in office 07/21/2023 by [facility] NP.  Past medical history significant for hypertension, diabetes, obesity, HLD.  TEST/EVENTS:  10/2014 PFT: FVC 88, FEV1 87, ratio 65, TLC 101, DLCOunc 87. No BD 06/15/2022 LDCT chest: ahterosclerosis. Mild emphysema with diffuse bronchial wall thickening. Few small scatterd nodules, largest 5.3 mm. Lung RADS 2.  11/08/2022 echo: EF 55-60%. GIDD. RV size and function nl. 07/21/2023 CXR: lungs are clear  10/20/2023 PFT: FVC 55, FEV1 43, ratio 58, TLC 108, DLCO 73. Severe obstruction with reversibility and moderate diffusion defect    08/30/2022: OV with Dr. Auston Left has a history of severe sleep apnea, diagnosed 20 years ago.  Using CPAP machine nightly.  Does not have a download to assess therapy.  Without his CPAP, he has very loud snoring, difficulties breathing, morning headaches and nonrefreshing sleep.  Still has some daytime tiredness despite use.  He has a diagnosis of COPD.  Uses albuterol  as needed.  Actively smoking.  Has a chronic cough with sputum production, consistent with chronic bronchitis.  Smoking cessation advised.  Will trial him on Trelegy.  Sleep study ordered to reassess severity of sleep apnea and get him a new CPAP machine.  Continue lung cancer screening CTs every year.  03/22/2023: OV with Adabella Stanis NP for follow-up.  Since he was here last, he had sleep study that revealed severe OSA.  He was able to get a new CPAP machine.  He is currently wearing a nasal mask but wants to switch over to the fullface mask.  He does not have the proper connector device.  He is going to go by his DME company when he leaves here to see if they can help him with this.  Does feel like he sleeps better with CPAP.  Still  feels tired during the day.  Does not want any additional medications.  Feels like it is tolerable as he is retired and can nap if he wants to.  Denies any issues with drowsy driving, morning headaches or sleep parasomnia/paralysis. His breathing is feeling right the same as it was last time he was here.  He is using albuterol  1-2 times a week.  He feels like he is able to do most of the things that he wants to do.  His main complaint is his persistent daily cough.  Produces clear phlegm.  Unchanged from his last visit. He also has daily chest congestion. He denies fevers, chills, night sweats, hemoptysis, anorexia, leg swelling, orthopnea. He's still smoking.  Unable to afford Trelegy. He did have a recent syncopal episode.  He did not have any symptoms prior to the episode. He was sitting in a chair and just lost consciousness according to his wife. He was taken to the ED. He tells me everything was normal there. Note reviewed - He did not wait for full workup but basic labs and EKG were unremarkable. Denies palpitations, lightheadedness/dizziness, CP, vision changes, headaches. He has not had any recurrent episodes since. He had an echo in May that was unremarkable.  02/19/2023-03/20/2023 CPAP 15 cmH2O 30/30 days; 100% greater than 4 hours; average use 8 hours 47 minutes Leaks 95th 44.4 AHI 2.2  07/21/2023: OV with Sharlee Rufino NP for  overdue follow-up and surgical clearance.  He is scheduled to undergo L4-5 lumbar microdiscectomy with Dr. Rochelle Chu on 08/07/2023.  He has been having a lot of issues with back pain recently.  Has not been sleeping as much because of this.  Has difficulties getting comfortable.  He is also been sleeping in his recliner.  He did move his CPAP into his living room so he could still use it in the recliner.  Usage has decreased because of this.  He is also found that the nasal mask is not fitting as well.  His wife says that he is sleeping with his mouth open a lot and still snoring.  He feels  like he has more sinus congestion and postnasal drip.  Mass to seems to put more pressure on his nose.  He was wearing a fullface mask before this but did not like having as much on his face.  He is open to going back to this.  Does not feel like the pressures are too low or too high.  Denies any issues with drowsy driving or sleep parasomnia/paralysis. Regarding his breathing, feels like he is overall stable.  He is not using the Wixela every day.  Does find that it helps with his stamina and chest congestion.  Still using albuterol  occasionally.  Cough has improved some.  Occasional clear phlegm.  Not noticing any wheezing. He has not had any further episodes of syncope, lightheadedness or dizziness. 06/21/2023-07/20/2023: CPAP 15 cmH2O 30/30 days; 67% >4 hr; average use 5 hr 22 min Leaks 95th 62 AHI 12.4  10/20/2023: Today - follow up Discussed the use of AI scribe software for clinical note transcription with the patient, who gave verbal consent to proceed.  History of Present Illness   James Lee is a 73 year old male with COPD who presents for follow-up on his COPD and CPAP usage. He also had PFTs today. He's had about a 30% decline in his lung function compared to 9 years ago. Moderately severe obstruction with reversibility.   He discusses his CPAP usage, noting that he is back to using it regularly. He uses the nasal mask instead of the full face mask, as he did not like the latter. He continues to experience sinus congestion despite using Flonase  nasal spray occasionally. He uses the CPAP every time he sleeps. He feels better rested with CPAP. Denies drowsy driving.   Regarding his COPD, he experiences shortness of breath when walking uphill but can manage downhill or flat ground ok. He uses Wixela as needed and albuterol  a couple of times a week when feeling wheezy. He continues to smoke. He has tried Trelegy in the past but found it too expensive. No increased cough, chest congestion,  hemoptysis. He has not set up his repeat lung cancer screening CT yet.   His back pain has resolved following surgery performed by Dr. Waymond Hailey at [facility]. He is satisfied with the outcome, stating the pain is 'gone.'      09/19/2023-10/18/2023 CPAP 15 cmH2O 30/30 days; 100% >4 hr; average use 9 hr 20 min Leaks 95th 44.9 AHI 2.3  No Known Allergies  Immunization History  Administered Date(s) Administered   Fluad Trivalent(High Dose 65+) 05/02/2023   Influenza,inj,Quad PF,6+ Mos 03/10/2018, 02/24/2022   Influenza-Unspecified 06/13/2013   PNEUMOCOCCAL CONJUGATE-20 02/24/2022   Pfizer Covid-19 Vaccine Bivalent Booster 42yrs & up 12/26/2020, 07/12/2022   Pneumococcal-Unspecified 06/13/2014, 10/28/2016   Td 06/18/2014, 10/28/2016   Tdap 06/18/2014   Unspecified SARS-COV-2 Vaccination  07/27/2019, 08/21/2019, 03/31/2020   Zoster Recombinant(Shingrix ) 10/28/2016    Past Medical History:  Diagnosis Date   Bacterial conjunctivitis of both eyes 02/27/2022   Benign essential HTN 09/02/2014   Bronchitis 03/14/2022   Cancer of prostate (HCC) 03/08/2011   Colon cancer screening 05/04/2022   Colon polyps    Coronary artery calcification seen on CAT scan    Coronary artery calcification seen on CAT scan 09/02/2014   Diabetes mellitus without complication (HCC)    Dyslipidemia    Elevated serum creatinine 11/11/2021   Gastric ulcer 11/29/2017   GERD (gastroesophageal reflux disease)    History of colonic polyps 05/03/2022   Hyperlipidemia    Hypertension    Lactic acidosis 11/11/2021   LBP (low back pain)    Male erectile dysfunction, unspecified 06/21/2011   NIDDM-2 with hyperglycemia 11/11/2021   Perforated bowel (HCC) 11/29/2017   Personal history of prostate cancer 08/16/2012   Prostate cancer (HCC) 2014   prostate    RBBB    RBBB 04/27/2021   Reduced libido 08/16/2012   Scrotal pain 08/16/2012   Sleep apnea    c-pap wears   Syncope 11/10/2021    Tobacco  History: Social History   Tobacco Use  Smoking Status Every Day   Current packs/day: 2.00   Average packs/day: 1.5 packs/day for 50.6 years (76.2 ttl pk-yrs)   Types: Cigarettes   Start date: 03/13/2012  Smokeless Tobacco Never  Tobacco Comments   Has gone up to 2 ppd 10/20/23   Ready to quit: Not Answered Counseling given: Not Answered Tobacco comments: Has gone up to 2 ppd 10/20/23   Outpatient Medications Prior to Visit  Medication Sig Dispense Refill   albuterol  (VENTOLIN  HFA) 108 (90 Base) MCG/ACT inhaler Inhale 2 puffs into the lungs every 6 (six) hours as needed for wheezing or shortness of breath. 8 g 2   amLODipine  (NORVASC ) 5 MG tablet Take 5 mg by mouth daily.     aspirin  81 MG tablet Take 1 tablet (81 mg total) by mouth daily. 30 tablet 0   chlorthalidone (HYGROTON) 50 MG tablet Take 50 mg by mouth daily.     Continuous Glucose Sensor (FREESTYLE LIBRE 2 SENSOR) MISC Place 1 sensor to skin every 14 days 1 each 5   docusate sodium  (COLACE) 100 MG capsule Take 100 mg by mouth daily.     FARXIGA  10 MG TABS tablet Take 1 tablet (10 mg total) by mouth daily. 90 tablet 3   fluticasone  (FLONASE ) 50 MCG/ACT nasal spray Place 2 sprays into both nostrils daily. 16 g 6   fluticasone -salmeterol (WIXELA INHUB) 100-50 MCG/ACT AEPB Inhale 1 puff into the lungs 2 (two) times daily. (Patient taking differently: Inhale 1 puff into the lungs as needed.) 60 each 11   gabapentin  (NEURONTIN ) 300 MG capsule Take 2 capsules (600 mg total) by mouth 3 (three) times daily. (Patient taking differently: Take 300 mg by mouth 2 (two) times daily.) 540 capsule 0   metFORMIN  (GLUCOPHAGE -XR) 500 MG 24 hr tablet Take 2 tablets (1,000 mg total) by mouth 2 (two) times daily with a meal. 360 tablet 3   methocarbamol  (ROBAXIN ) 500 MG tablet Take 500 mg by mouth every 8 (eight) hours as needed.     metoprolol  succinate (TOPROL -XL) 50 MG 24 hr tablet TAKE 1 TABLET(50 MG) BY MOUTH DAILY 90 tablet 2   Multiple  Vitamins-Minerals (MULTIVITAMIN WITH MINERALS) tablet Take 1 tablet by mouth daily.     olmesartan  (BENICAR ) 40 MG tablet Take  40 mg by mouth daily.     oxyCODONE -acetaminophen  (PERCOCET) 10-325 MG tablet Take 1 tablet by mouth every 4 (four) hours as needed for pain. 28 tablet 0   rosuvastatin  (CRESTOR ) 10 MG tablet Take 1 tablet (10 mg total) by mouth every evening. 90 tablet 3   Semaglutide  (RYBELSUS ) 14 MG TABS Take 1 tablet (14 mg total) by mouth daily. 120 tablet 3   No facility-administered medications prior to visit.     Review of Systems:   Constitutional: No weight loss or gain, night sweats, fevers, chills, or lassitude. +fatigue (improved) HEENT: No headaches, difficulty swallowing, tooth/dental problems, or sore throat. No sneezing, itching, ear ache +nasal congestion, post nasal drip CV:  No chest pain, orthopnea, PND, swelling in lower extremities, anasarca, dizziness, palpitations, syncope Resp: +shortness of breath with exertion; productive daily cough (baseline). No excess mucus or change in color of mucus. No hemoptysis. No wheezing.  No chest wall deformity GI:  No heartburn, indigestion GU: No dysuria, change in color of urine, urgency or frequency.   Skin: No rash, lesions, ulcerations MSK:  No joint pain or swelling.  No back pain  Neuro: No dizziness or lightheadedness.  Psych: No depression or anxiety. Mood stable.     Physical Exam:  BP 120/80 (BP Location: Right Arm, Patient Position: Sitting, Cuff Size: Large)   Pulse 62   Temp 98.2 F (36.8 C) (Oral)   Ht 5\' 11"  (1.803 m)   Wt 251 lb 9.6 oz (114.1 kg)   SpO2 96%   BMI 35.09 kg/m   GEN: Pleasant, interactive, well-kempt; obese; in no acute distress. HEENT:  Normocephalic and atraumatic. PERRLA. Sclera white. Nasal turbinates erythematous, moist and patent bilaterally. No rhinorrhea present. Oropharynx pink and moist, without exudate or edema. No lesions, ulcerations, postnasal drainage  NECK:  Supple  w/ fair ROM. No JVD present. Thyroid  symmetrical with no goiter or nodules palpated. No lymphadenopathy.   CV: RRR, no m/r/g, no peripheral edema. Pulses intact, +2 bilaterally. No cyanosis, pallor or clubbing. PULMONARY:  Unlabored, regular breathing. Clear bilaterally A&P w/o wheezes/rales/rhonchi. No accessory muscle use.  GI: BS present and normoactive. Soft, non-tender to palpation. No organomegaly or masses detected. MSK: No erythema, warmth or tenderness. Cap refil <2 sec all extrem. No deformities or joint swelling noted.  Neuro: A/Ox3. No focal deficits noted.   Skin: Warm, no lesions or rashe Psych: Normal affect and behavior. Judgement and thought content appropriate.     Lab Results:  CBC    Component Value Date/Time   WBC 9.6 03/20/2023 1442   RBC 4.92 03/20/2023 1442   HGB 15.4 03/20/2023 1442   HGB 15.9 01/18/2023 1450   HCT 45.8 03/20/2023 1442   HCT 47.6 01/18/2023 1450   PLT 227 03/20/2023 1442   PLT 262 01/18/2023 1450   MCV 93.1 03/20/2023 1442   MCV 92 01/18/2023 1450   MCH 31.3 03/20/2023 1442   MCHC 33.6 03/20/2023 1442   RDW 12.7 03/20/2023 1442   RDW 12.9 01/18/2023 1450   LYMPHSABS 2.8 03/06/2018 0946   MONOABS 0.7 03/06/2018 0946   EOSABS 0.2 03/06/2018 0946   BASOSABS 0.1 03/06/2018 0946    BMET    Component Value Date/Time   NA 137 03/20/2023 1442   NA 140 01/18/2023 1450   K 3.5 03/20/2023 1442   CL 102 03/20/2023 1442   CO2 25 03/20/2023 1442   GLUCOSE 146 (H) 03/20/2023 1442   BUN 28 (H) 03/20/2023 1442   BUN 20  01/18/2023 1450   CREATININE 1.87 (H) 03/20/2023 1442   CALCIUM  8.9 03/20/2023 1442   GFRNONAA 38 (L) 03/20/2023 1442   GFRAA >60 03/06/2018 0946    BNP    Component Value Date/Time   BNP 20.1 11/10/2021 1343     Imaging:  No results found.   Administration History     None          Latest Ref Rng & Units 10/20/2023    8:56 AM 10/22/2014    9:43 AM  PFT Results  FVC-Pre L 2.57  P 4.30   FVC-Predicted  Pre % 55  P 88   FVC-Post L 3.15  P 4.61   FVC-Predicted Post % 68  P 94   Pre FEV1/FVC % % 57  P 75   Post FEV1/FCV % % 58  P 65   FEV1-Pre L 1.47  P 3.24   FEV1-Predicted Pre % 43  P 87   FEV1-Post L 1.81  P 3.00   DLCO uncorrected ml/min/mmHg 19.41  P 29.54   DLCO UNC% % 73  P 87   DLVA Predicted % 81  P 89   TLC L 7.83  P 7.34   TLC % Predicted % 108  P 101   RV % Predicted % 165  P 131     P Preliminary result    No results found for: "NITRICOXIDE"      Assessment & Plan:   COPD with chronic bronchitis and emphysema (HCC) Moderately severe COPD with reversibility on PFTs today. Decline when compared to prior 9 years ago. Discussed disease progression and prognosis. Educated on impact of continued smoking and smoking cessation strongly advised. Encouraged him to utilize [place] on a scheduled basis to help with maintenance and assess if this reduces SABA use/improves stamina. Action plan in place.  Patient Instructions  Continue CPAP every night, minimum of 4-6 hours a night.  Change equipment as directed. Wash your tubing with warm soap and water daily, hang to dry. Wash humidifier portion weekly. Use bottled, distilled water and change daily Be aware of reduced alertness and do not drive or operate heavy machinery if experiencing this or drowsiness.  Exercise encouraged, as tolerated. Healthy weight management discussed.  Avoid or decrease alcohol consumption and medications that make you more sleepy, if possible. Notify if persistent daytime sleepiness occurs even with consistent use of PAP therapy.   Adjusted CPAP settings to 10-15 auto set. Let me know if this doesn't work for you   Continue Albuterol  inhaler 2 puffs every 6 hours as needed for shortness of breath or wheezing. Notify if symptoms persist despite rescue inhaler/neb use.  Increase use of Wixela 1 puff Twice daily. Brush tongue and rinse mouth afterwards. Make sure you're using this every day  Continue  flonase  nasal spray 2 sprays each nostril daily Use saline nasal spray 2-3 times a day    Your lung function testing shows decrease in your lung function compared to prior. You have moderately severe COPD now. You do have good response to the albuterol . Need to work on quitting smoking, best of your ability as this is causing your disease to progress faster   Call the lung cancer screening program to schedule your CT scan - [phone]   Follow up in 4 months with Dr. Auston Left or Gina Lagos. If symptoms do not improve or worsen, please contact office for sooner follow up or seek emergency care.    Sleep apnea Severe OSA on CPAP.  Excellent compliance and control. Receives benefit from use. Aware of proper care/use of device. Healthy weight loss encouraged. Safe driving practices reviewed.  He does continue to have some difficulties with rhinitis despite nasal sprays. He prefers nasal mask over the full face. Possible his pressures are too high. Will adjust him to an auto setting 10-15 cmH2O and reassess response.   Tobacco use Continues to smoke. See above. Provided with number for lung cancer screening program to reschedule his CT chest as he is overdue.      I spent 35 minutes of dedicated to the care of this patient on the date of this encounter to include pre-visit review of records, face-to-face time with the patient discussing conditions above, post visit ordering of testing, clinical documentation with the electronic health record, making appropriate referrals as documented, and communicating necessary findings to members of the patients care team.  Roetta Clarke, NP 10/20/2023  Pt aware and understands NP's role.

## 2023-10-26 DIAGNOSIS — E1165 Type 2 diabetes mellitus with hyperglycemia: Secondary | ICD-10-CM | POA: Diagnosis not present

## 2023-10-27 ENCOUNTER — Other Ambulatory Visit

## 2023-10-30 ENCOUNTER — Encounter: Payer: Self-pay | Admitting: Family Medicine

## 2023-10-30 ENCOUNTER — Ambulatory Visit: Payer: Medicare Other | Admitting: Family Medicine

## 2023-10-30 VITALS — BP 138/68 | HR 84 | Temp 98.4°F | Resp 20 | Ht 71.0 in | Wt 254.2 lb

## 2023-10-30 DIAGNOSIS — E1122 Type 2 diabetes mellitus with diabetic chronic kidney disease: Secondary | ICD-10-CM | POA: Diagnosis not present

## 2023-10-30 DIAGNOSIS — E1159 Type 2 diabetes mellitus with other circulatory complications: Secondary | ICD-10-CM

## 2023-10-30 DIAGNOSIS — R0982 Postnasal drip: Secondary | ICD-10-CM

## 2023-10-30 DIAGNOSIS — J439 Emphysema, unspecified: Secondary | ICD-10-CM

## 2023-10-30 DIAGNOSIS — Z720 Tobacco use: Secondary | ICD-10-CM

## 2023-10-30 DIAGNOSIS — E785 Hyperlipidemia, unspecified: Secondary | ICD-10-CM

## 2023-10-30 DIAGNOSIS — D126 Benign neoplasm of colon, unspecified: Secondary | ICD-10-CM

## 2023-10-30 DIAGNOSIS — I152 Hypertension secondary to endocrine disorders: Secondary | ICD-10-CM | POA: Diagnosis not present

## 2023-10-30 DIAGNOSIS — N1832 Chronic kidney disease, stage 3b: Secondary | ICD-10-CM | POA: Diagnosis not present

## 2023-10-30 DIAGNOSIS — E1169 Type 2 diabetes mellitus with other specified complication: Secondary | ICD-10-CM

## 2023-10-30 DIAGNOSIS — J4489 Other specified chronic obstructive pulmonary disease: Secondary | ICD-10-CM

## 2023-10-30 DIAGNOSIS — L700 Acne vulgaris: Secondary | ICD-10-CM

## 2023-10-30 DIAGNOSIS — L578 Other skin changes due to chronic exposure to nonionizing radiation: Secondary | ICD-10-CM

## 2023-10-30 DIAGNOSIS — Z122 Encounter for screening for malignant neoplasm of respiratory organs: Secondary | ICD-10-CM

## 2023-10-30 DIAGNOSIS — L821 Other seborrheic keratosis: Secondary | ICD-10-CM

## 2023-10-30 DIAGNOSIS — E1142 Type 2 diabetes mellitus with diabetic polyneuropathy: Secondary | ICD-10-CM

## 2023-10-30 LAB — POCT GLYCOSYLATED HEMOGLOBIN (HGB A1C): Hemoglobin A1C: 6.9 % — AB (ref 4.0–5.6)

## 2023-10-30 LAB — MICROALBUMIN / CREATININE URINE RATIO
Creatinine,U: 104.4 mg/dL
Microalb Creat Ratio: 914.4 mg/g — ABNORMAL HIGH (ref 0.0–30.0)
Microalb, Ur: 95.5 mg/dL — ABNORMAL HIGH (ref 0.0–1.9)

## 2023-10-30 LAB — COMPREHENSIVE METABOLIC PANEL WITH GFR
ALT: 14 U/L (ref 0–53)
AST: 15 U/L (ref 0–37)
Albumin: 4.2 g/dL (ref 3.5–5.2)
Alkaline Phosphatase: 54 U/L (ref 39–117)
BUN: 29 mg/dL — ABNORMAL HIGH (ref 6–23)
CO2: 29 meq/L (ref 19–32)
Calcium: 9.1 mg/dL (ref 8.4–10.5)
Chloride: 102 meq/L (ref 96–112)
Creatinine, Ser: 2.33 mg/dL — ABNORMAL HIGH (ref 0.40–1.50)
GFR: 27.63 mL/min — ABNORMAL LOW (ref >60.00)
Glucose, Bld: 139 mg/dL — ABNORMAL HIGH (ref 70–99)
Potassium: 4.4 meq/L (ref 3.5–5.1)
Sodium: 139 meq/L (ref 135–145)
Total Bilirubin: 0.5 mg/dL (ref 0.2–1.2)
Total Protein: 6.6 g/dL (ref 6.0–8.3)

## 2023-10-30 LAB — LIPID PANEL
Cholesterol: 129 mg/dL (ref 0–200)
HDL: 28.5 mg/dL — ABNORMAL LOW (ref >39.00)
LDL Cholesterol: 35 mg/dL (ref 0–99)
NonHDL: 100.46
Total CHOL/HDL Ratio: 5
Triglycerides: 328 mg/dL — ABNORMAL HIGH (ref 0.0–149.0)
VLDL: 65.6 mg/dL — ABNORMAL HIGH (ref 0.0–40.0)

## 2023-10-30 MED ORDER — ROSUVASTATIN CALCIUM 10 MG PO TABS: 10.0000 mg | ORAL_TABLET | Freq: Every evening | ORAL | 3 refills | Status: DC

## 2023-10-30 MED ORDER — METFORMIN HCL ER 500 MG PO TB24: 1000.0000 mg | ORAL_TABLET | Freq: Two times a day (BID) | ORAL | 3 refills | Status: DC

## 2023-10-30 MED ORDER — FARXIGA 10 MG PO TABS: 10.0000 mg | ORAL_TABLET | Freq: Every day | ORAL | 3 refills | Status: DC

## 2023-10-30 MED ORDER — GABAPENTIN 300 MG PO CAPS: 600.0000 mg | ORAL_CAPSULE | Freq: Two times a day (BID) | ORAL | 3 refills | Status: DC

## 2023-10-30 MED ORDER — FLUTICASONE PROPIONATE 50 MCG/ACT NA SUSP: 2.0000 | Freq: Every day | NASAL | 6 refills | Status: DC

## 2023-10-30 MED ORDER — RYBELSUS 14 MG PO TABS: 14.0000 mg | ORAL_TABLET | Freq: Every day | ORAL | Status: DC

## 2023-10-30 NOTE — Patient Instructions (Addendum)
 It was a pleasure meeting you today. Thank you for allowing me to take part in your health care.  Our goals for today as we discussed include:  We will get some labs today.  If they are abnormal or we need to do something about them, I will call you.  If they are normal, I will send you a message on MyChart (if it is active) or a letter in the mail.  If you don't hear from us  in 2 weeks, please call the office at the number below.   Referral sent to [facility] for follow up colonoscopy.  They will call to schedule appointment with Dr Antony Baumgartner.  I had reached out to him and he will follow up with you there.  Refills sent for requested medications  Follow up with Nephrology as scheduled  Follow up with Dermatology as scheduled  This is a list of the screening recommended for you and due dates:  Health Maintenance  Topic Date Due   Zoster (Shingles) Vaccine (2 of 2) 12/23/2016   COVID-19 Vaccine (6 - 2024-25 season) 02/12/2023   Screening for Lung Cancer  06/16/2023   Yearly kidney health urinalysis for diabetes  07/14/2023   Eye exam for diabetics  07/26/2023   Colon Cancer Screening  11/02/2023   Flu Shot  01/12/2024   Yearly kidney function blood test for diabetes  03/19/2024   Hemoglobin A1C  05/01/2024   Medicare Annual Wellness Visit  06/11/2024   Complete foot exam   08/16/2024   DTaP/Tdap/Td vaccine (5 - Td or Tdap) 10/29/2026   Pneumonia Vaccine  Completed   Hepatitis C Screening  Completed   HPV Vaccine  Aged Out   Meningitis B Vaccine  Aged Out     If you have any questions or concerns, please do not hesitate to call the office at [phone].  I look forward to our next visit and until then take care and stay safe.  Regards,   Valli Gaw, MD   [facility]

## 2023-11-01 ENCOUNTER — Ambulatory Visit: Payer: Medicare Other | Admitting: Dermatology

## 2023-11-01 ENCOUNTER — Encounter: Payer: Self-pay | Admitting: Dermatology

## 2023-11-01 DIAGNOSIS — L700 Acne vulgaris: Secondary | ICD-10-CM

## 2023-11-01 DIAGNOSIS — W908XXA Exposure to other nonionizing radiation, initial encounter: Secondary | ICD-10-CM | POA: Diagnosis not present

## 2023-11-01 DIAGNOSIS — L821 Other seborrheic keratosis: Secondary | ICD-10-CM

## 2023-11-01 DIAGNOSIS — L578 Other skin changes due to chronic exposure to nonionizing radiation: Secondary | ICD-10-CM

## 2023-11-01 DIAGNOSIS — Z7189 Other specified counseling: Secondary | ICD-10-CM

## 2023-11-01 DIAGNOSIS — Z79899 Other long term (current) drug therapy: Secondary | ICD-10-CM

## 2023-11-01 DIAGNOSIS — L720 Epidermal cyst: Secondary | ICD-10-CM

## 2023-11-01 DIAGNOSIS — L729 Follicular cyst of the skin and subcutaneous tissue, unspecified: Secondary | ICD-10-CM

## 2023-11-01 DIAGNOSIS — L820 Inflamed seborrheic keratosis: Secondary | ICD-10-CM

## 2023-11-01 DIAGNOSIS — I878 Other specified disorders of veins: Secondary | ICD-10-CM

## 2023-11-01 DIAGNOSIS — L988 Other specified disorders of the skin and subcutaneous tissue: Secondary | ICD-10-CM

## 2023-11-01 DIAGNOSIS — R238 Other skin changes: Secondary | ICD-10-CM

## 2023-11-01 MED ORDER — TRETINOIN 0.05 % EX CREA
TOPICAL_CREAM | CUTANEOUS | 11 refills | Status: AC
Start: 2023-11-01 — End: ?

## 2023-11-01 NOTE — Patient Instructions (Addendum)
 For bumps at face  Start tretinoin 0.05 % cream - apply pea sized amount to face nightly as tolerated   Topical retinoid medications like tretinoin/Retin-A, adapalene/Differin, tazarotene/Fabior, and Epiduo/Epiduo Forte can cause dryness and irritation when first started. Only apply a pea-sized amount to the entire affected area. Avoid applying it around the eyes, edges of mouth and creases at the nose. If you experience irritation, use a good moisturizer first and/or apply the medicine less often. If you are doing well with the medicine, you can increase how often you use it until you are applying every night. Be careful with sun protection while using this medication as it can make you sensitive to the sun. This medicine should not be used by pregnant women.      Pre-Operative Instructions  You are scheduled for a surgical procedure at [facility]. We recommend you read the following instructions. If you have any questions or concerns, please call the office at [phone].  Shower and wash the entire body with soap and water the day of your surgery paying special attention to cleansing at and around the planned surgery site.  Avoid aspirin  or aspirin  containing products at least fourteen (14) days prior to your surgical procedure and for at least one week (7 Days) after your surgical procedure. If you take aspirin  on a regular basis for heart disease or history of stroke or for any other reason, we may recommend you continue taking aspirin  but please notify us  if you take this on a regular basis. Aspirin  can cause more bleeding to occur during surgery as well as prolonged bleeding and bruising after surgery.   Avoid other nonsteroidal pain medications at least one week prior to surgery and at least one week prior to your surgery. These include medications such as Ibuprofen (Motrin, Advil and Nuprin), Naprosyn, Voltaren, Relafen, etc. If medications are used for therapeutic reasons,  please inform us  as they can cause increased bleeding or prolonged bleeding during and bruising after surgical procedures.   Please advise us  if you are taking any "blood thinner" medications such as Coumadin or Dipyridamole or Plavix or similar medications. These cause increased bleeding and prolonged bleeding during procedures and bruising after surgical procedures. We may have to consider discontinuing these medications briefly prior to and shortly after your surgery if safe to do so.   Please inform us  of all medications you are currently taking. All medications that are taken regularly should be taken the day of surgery as you always do. Nevertheless, we need to be informed of what medications you are taking prior to surgery to know whether they will affect the procedure or cause any complications.   Please inform us  of any medication allergies. Also inform us  of whether you have allergies to Latex or rubber products or whether you have had any adverse reaction to Lidocaine  or Epinephrine .  Please inform us  of any prosthetic or artificial body parts such as artificial heart valve, joint replacements, etc., or similar condition that might require preoperative antibiotics.   We recommend avoidance of alcohol at least two weeks prior to surgery and continued avoidance for at least two weeks after surgery.   We recommend discontinuation of tobacco smoking at least two weeks prior to surgery and continued abstinence for at least two weeks after surgery.  Do not plan strenuous exercise, strenuous work or strenuous lifting for approximately four weeks after your surgery.   We request if you are unable to make your scheduled surgical appointment,  please call us  at least a week in advance or as soon as you are aware of a problem so that we can cancel or reschedule the appointment.   You MAY TAKE TYLENOL  (acetaminophen ) for pain as it is not a blood thinner.   PLEASE PLAN TO BE IN TOWN FOR TWO WEEKS  FOLLOWING SURGERY, THIS IS IMPORTANT SO YOU CAN BE CHECKED FOR DRESSING CHANGES, SUTURE REMOVAL AND TO MONITOR FOR POSSIBLE COMPLICATIONS.       Seborrheic Keratosis  What causes seborrheic keratoses? Seborrheic keratoses are harmless, common skin growths that first appear during adult life.  As time goes by, more growths appear.  Some people may develop a large number of them.  Seborrheic keratoses appear on both covered and uncovered body parts.  They are not caused by sunlight.  The tendency to develop seborrheic keratoses can be inherited.  They vary in color from skin-colored to gray, brown, or even black.  They can be either smooth or have a rough, warty surface.   Seborrheic keratoses are superficial and look as if they were stuck on the skin.  Under the microscope this type of keratosis looks like layers upon layers of skin.  That is why at times the top layer may seem to fall off, but the rest of the growth remains and re-grows.    Treatment Seborrheic keratoses do not need to be treated, but can easily be removed in the office.  Seborrheic keratoses often cause symptoms when they rub on clothing or jewelry.  Lesions can be in the way of shaving.  If they become inflamed, they can cause itching, soreness, or burning.  Removal of a seborrheic keratosis can be accomplished by freezing, burning, or surgery. If any spot bleeds, scabs, or grows rapidly, please return to have it checked, as these can be an indication of a skin cancer.   Cryotherapy Aftercare  Wash gently with soap and water everyday.   Apply Vaseline and Band-Aid daily until healed.   Due to recent changes in healthcare laws, you may see results of your pathology and/or laboratory studies on MyChart before the doctors have had a chance to review them. We understand that in some cases there may be results that are confusing or concerning to you. Please understand that not all results are received at the same time and often  the doctors may need to interpret multiple results in order to provide you with the best plan of care or course of treatment. Therefore, we ask that you please give us  2 business days to thoroughly review all your results before contacting the office for clarification. Should we see a critical lab result, you will be contacted sooner.   If You Need Anything After Your Visit  If you have any questions or concerns for your doctor, please call our main line at [phone] and press option 4 to reach your doctor's medical assistant. If no one answers, please leave a voicemail as directed and we will return your call as soon as possible. Messages left after 4 pm will be answered the following business day.   You may also send us  a message via MyChart. We typically respond to MyChart messages within 1-2 business days.  For prescription refills, please ask your pharmacy to contact our office. Our fax number is [phone].  If you have an urgent issue when the clinic is closed that cannot wait until the next business day, you can page your doctor at the number below.  Please note that while we do our best to be available for urgent issues outside of office hours, we are not available 24/7.   If you have an urgent issue and are unable to reach us , you may choose to seek medical care at your doctor's office, retail clinic, urgent care center, or emergency room.  If you have a medical emergency, please immediately call 911 or go to the emergency department.  Pager Numbers  - Dr. Bary Likes: [phone]  - Dr. Annette Barters: [phone]  - Dr. Felipe Horton: [phone]   In the event of inclement weather, please call our main line at [phone] for an update on the status of any delays or closures.  Dermatology Medication Tips: Please keep the boxes that topical medications come in in order to help keep track of the instructions about where and how to use these. Pharmacies typically print the medication  instructions only on the boxes and not directly on the medication tubes.   If your medication is too expensive, please contact our office at [phone] option 4 or send us  a message through MyChart.   We are unable to tell what your co-pay for medications will be in advance as this is different depending on your insurance coverage. However, we may be able to find a substitute medication at lower cost or fill out paperwork to get insurance to cover a needed medication.   If a prior authorization is required to get your medication covered by your insurance company, please allow us  1-2 business days to complete this process.  Drug prices often vary depending on where the prescription is filled and some pharmacies may offer cheaper prices.  The website www.goodrx.com contains coupons for medications through different pharmacies. The prices here do not account for what the cost may be with help from insurance (it may be cheaper with your insurance), but the website can give you the price if you did not use any insurance.  - You can print the associated coupon and take it with your prescription to the pharmacy.  - You may also stop by our office during regular business hours and pick up a GoodRx coupon card.  - If you need your prescription sent electronically to a different pharmacy, notify our office through [facility] or by phone at [phone] option 4.     Si Usted Necesita Algo Despus de Su Visita  Tambin puede enviarnos un mensaje a travs de Clinical cytogeneticist. Por lo general respondemos a los mensajes de MyChart en el transcurso de 1 a 2 das hbiles.  Para renovar recetas, por favor pida a su farmacia que se ponga en contacto con nuestra oficina. Franz Jacks de fax es [employer] [phone].  Si tiene un asunto urgente cuando la clnica est cerrada y que no puede esperar hasta el siguiente da hbil, puede llamar/localizar a su doctor(a) al nmero que aparece a continuacin.   Por  favor, tenga en cuenta que aunque hacemos todo lo posible para estar disponibles para asuntos urgentes fuera del horario de [place], no estamos disponibles las 24 horas del da, los 7 [address]  [address] de la [place].   Si tiene un problema urgente y no puede comunicarse con nosotros, puede optar por buscar atencin mdica  en el consultorio de su doctor(a), en una clnica privada, en un centro de atencin urgente o en una sala de emergencias.  Si tiene Engineer, drilling, por favor llame inmediatamente al 911 o vaya a la sala de emergencias.  Nmeros de bper  - Dr.  Bary Likes: [phone]  - Dra. Annette Barters: [phone]  - Dr. Felipe Horton: [phone]   En caso de inclemencias del tiempo, por favor llame a Lajuan Pila principal al [phone] para una actualizacin sobre el [place] de cualquier retraso o cierre.  Consejos para la medicacin en dermatologa: Por favor, guarde las cajas en las que vienen los medicamentos de uso tpico para ayudarle a seguir las instrucciones sobre dnde y cmo usarlos. Las farmacias generalmente imprimen las instrucciones del medicamento slo en las cajas y no directamente en los tubos del [place].   Si su medicamento es muy caro, por favor, pngase en contacto con Bettyjane Brunet llamando al [phone] y presione la opcin 4 o envenos un mensaje a travs de Clinical cytogeneticist.   No podemos decirle cul ser su copago por los medicamentos por adelantado ya que esto es diferente dependiendo de la cobertura de su seguro. Sin embargo, es posible que podamos encontrar un medicamento sustituto a Audiological scientist un formulario para que el seguro cubra el medicamento que se considera necesario.   Si se requiere una autorizacin previa para que su compaa de seguros [place] su medicamento, por favor permtanos de 1 a 2 das hbiles para completar este proceso.  Los precios de los medicamentos varan con frecuencia dependiendo del Environmental consultant de dnde se surte la receta y alguna farmacias  pueden ofrecer precios ms baratos.  El sitio web www.goodrx.com tiene cupones para medicamentos de Health and safety inspector. Los precios aqu no tienen en cuenta lo que podra costar con la ayuda del seguro (puede ser ms barato con su seguro), pero el sitio web puede darle el precio si no utiliz Tourist information centre manager.  - Puede imprimir el cupn correspondiente y llevarlo con su receta a la farmacia.  - Tambin puede pasar por nuestra oficina durante el horario de atencin regular y Education officer, museum una tarjeta de cupones de GoodRx.  - Si necesita que su receta se enve electrnicamente a una farmacia diferente, informe a nuestra oficina a travs de MyChart de [place] o por telfono llamando al [phone] y presione la opcin 4.

## 2023-11-01 NOTE — Progress Notes (Signed)
 New Patient Visit   Subjective  James Lee is a 73 y.o. male who presents for the following: patient here today concerning some spots at scalp that he noticed several months ago and reports are growing. He also has a spot at nasal bridge he reports is irritated by glasses and some spot at back that his wife noticed he would like checked.   Patient denies any personal or family history of skin cancer.   The patient has spots, moles and lesions to be evaluated, some may be new or changing and the patient may have concern these could be cancer.  The following portions of the chart were reviewed this encounter and updated as appropriate: medications, allergies, medical history  Review of Systems:  No other skin or systemic complaints except as noted in HPI or Assessment and Plan.  Objective  Well appearing patient in no apparent distress; mood and affect are within normal limits.  A focused examination was performed of the following areas: Back, face, left wrist, scalp, nose  Relevant exam findings are noted in the Assessment and Plan.  Left nasal bridge - venous lake   Epidermal cyst at mid to upper back spinal   Epidermal cyst at mid to upper back spinal     face Comedones and cyst on face see photos  scalp  x 2, left wrist x 1 (3) Erythematous stuck-on, waxy papule or plaque  Assessment & Plan   SEBORRHEIC KERATOSIS - Stuck-on, waxy, tan-brown papules and/or plaques  - Benign-appearing - Discussed benign etiology and prognosis. - Observe - Call for any changes  EPIDERMAL INCLUSION CYST Exam: 1.2 cm Subcutaneous nodule at mid to upper back spinal  See photos Cyst with symptoms and/or recent change.  Discussed surgical excision to remove, including resulting scar and possible recurrence.  Patient will schedule for surgery. Pre-op information given. Will schedule surgery with Dr. Felipe Horton   VENOUS LAKE AT LEFT NASAL BRIDGE  Exam: purple papule at left nasal bridge   see photo Treatment Plan: Benign. Observe  Observed blanching under dermascopy  Discussed can be treated with Laser BBL Counseling for BBL / IPL / Laser and Coordination of Care Discussed the treatment option of Broad Band Light (BBL) /Intense Pulsed Light (IPL)/ Laser for skin discoloration, including brown spots and redness.  Typically we recommend at least 1-3 treatment sessions about 5-8 weeks apart for best results.  Cannot have tanned skin when BBL performed, and regular use of sunscreen/photoprotection is advised after the procedure to help maintain results. The patient's condition may also require "maintenance treatments" in the future.  The fee for BBL / laser treatments is $350 per treatment session for the whole face.  A fee can be quoted for other parts of the body.  Insurance typically does not pay for BBL/laser treatments and therefore the fee is an out-of-pocket cost. Recommend prophylactic valtrex treatment. Once scheduled for procedure, will send Rx in prior to patient's appointment.    ACTINIC DAMAGE - chronic, secondary to cumulative UV radiation exposure/sun exposure over time - diffuse scaly erythematous macules with underlying dyspigmentation - Recommend daily broad spectrum sunscreen SPF 30+ to sun-exposed areas, reapply every 2 hours as needed.  - Recommend staying in the shade or wearing long sleeves, sun glasses (UVA+UVB protection) and wide brim hats (4-inch brim around the entire circumference of the hat). - Call for new or changing lesions.  FAVRE AND RACOUCHOT SYNDROME face Benign. See photos nodular elastosis with cysts and comedones associated with age,  sun exposure and smoking  Discussed can be manually expressed or treated with topical creams  Patient prefers topical treatment  Start tretinoin 0.05 % cream - apply pea sized amount to face qhs as tolerated  Patient advised to send mychart or call if he is unable to get rx.   Topical retinoid medications  like tretinoin/Retin-A, adapalene/Differin, tazarotene/Fabior, and Epiduo/Epiduo Forte can cause dryness and irritation when first started. Only apply a pea-sized amount to the entire affected area. Avoid applying it around the eyes, edges of mouth and creases at the nose. If you experience irritation, use a good moisturizer first and/or apply the medicine less often. If you are doing well with the medicine, you can increase how often you use it until you are applying every night. Be careful with sun protection while using this medication as it can make you sensitive to the sun. This medicine should not be used by pregnant women.    tretinoin (RETIN-A) 0.05 % cream - face Apply pea sized amount nightly to face as tolerated INFLAMED SEBORRHEIC KERATOSIS (3) scalp  x 2, left wrist x 1 (3) Symptomatic, irritating, patient would like treated. Destruction of lesion - scalp  x 2, left wrist x 1 (3) Complexity: simple   Destruction method: cryotherapy   Informed consent: discussed and consent obtained   Timeout:  patient name, date of birth, surgical site, and procedure verified Lesion destroyed using liquid nitrogen: Yes   Region frozen until ice ball extended beyond lesion: Yes   Outcome: patient tolerated procedure well with no complications   Post-procedure details: wound care instructions given   ACTINIC SKIN DAMAGE   SEBORRHEIC KERATOSIS   CYST OF SKIN   VENOUS LAKE OF SKIN OF NOSE   NODULAR ELASTOSIS WITH CYSTS AND COMEDONES OF FAVRE AND RACOUCHOT    Return for february favre and racouchot syndrome .  IRandee Busing, CMA, am acting as scribe for Celine Collard, MD.   Documentation: I have reviewed the above documentation for accuracy and completeness, and I agree with the above.  Celine Collard, MD

## 2023-11-02 DIAGNOSIS — H43813 Vitreous degeneration, bilateral: Secondary | ICD-10-CM | POA: Diagnosis not present

## 2023-11-02 DIAGNOSIS — H5021 Vertical strabismus, right eye: Secondary | ICD-10-CM | POA: Diagnosis not present

## 2023-11-02 DIAGNOSIS — E119 Type 2 diabetes mellitus without complications: Secondary | ICD-10-CM | POA: Diagnosis not present

## 2023-11-02 DIAGNOSIS — Z961 Presence of intraocular lens: Secondary | ICD-10-CM | POA: Diagnosis not present

## 2023-11-02 LAB — HM DIABETES EYE EXAM

## 2023-11-03 ENCOUNTER — Other Ambulatory Visit (INDEPENDENT_AMBULATORY_CARE_PROVIDER_SITE_OTHER): Payer: Self-pay | Admitting: Podiatry

## 2023-11-03 ENCOUNTER — Telehealth: Payer: Self-pay

## 2023-11-03 DIAGNOSIS — E1142 Type 2 diabetes mellitus with diabetic polyneuropathy: Secondary | ICD-10-CM

## 2023-11-03 MED ORDER — NONFORMULARY OR COMPOUNDED ITEM: 2 refills | Status: AC

## 2023-11-03 NOTE — Telephone Encounter (Signed)
 Message received by Dr. Zettie Hillock regarding peripheral neuropathy cream not being able to be refilled. Since this is a compounded prescription, it will need to be printed and signed by prescribing provider then faxed to pharmacy [facility] [phone] [place] [phone] (PrimaryTelephone).  RX was written as follows:  Medication NDC Prescription # DAW  PERIPHERAL NEUROPATHY CRE (BUPIVACAINE  HCL MONOHYDRATE HCL POWDER, DOXEPIN HCL  POWDER, GABAPENTIN   POWD) [id number] No  Written Date Last Fill Date Quantity Days Supply  09/02/2022 09/07/2022 120 each 15  Sig Notes  APPLY ONE (1) TO TWO (2) GRAMS ( ONE (1) TO TWO (2) PUMPS) TO AFFECTED AREA THREE (3) TO FOUR (4) TIMES DAILY Not Available

## 2023-11-03 NOTE — Progress Notes (Signed)
 1. Diabetic polyneuropathy associated with type 2 diabetes mellitus (HCC)    Meds ordered this encounter  Medications   NONFORMULARY OR COMPOUNDED ITEM    Sig: PERIPHERAL NEUROPATHY CREAM (BUPIVACAINE  HCL MONOHYDRATE HCL POWDER, DOXEPIN HCL  POWDER, GABAPENTIN   POWDER   Sig APPLY ONE (1) TO TWO (2) GRAMS ( ONE (1) TO TWO (2) PUMPS) TO AFFECTED AREA THREE (3) TO FOUR (4) TIMES DAILY    Dispense:  120 each    Refill:  2   Called into [facility] [id number]; spoke to [place].  [address]   [place] [address]  [phone] (PrimaryTelephone)

## 2023-11-05 ENCOUNTER — Encounter: Payer: Self-pay | Admitting: Family Medicine

## 2023-11-05 DIAGNOSIS — L821 Other seborrheic keratosis: Secondary | ICD-10-CM | POA: Insufficient documentation

## 2023-11-05 DIAGNOSIS — Z122 Encounter for screening for malignant neoplasm of respiratory organs: Secondary | ICD-10-CM | POA: Insufficient documentation

## 2023-11-05 DIAGNOSIS — L578 Other skin changes due to chronic exposure to nonionizing radiation: Secondary | ICD-10-CM | POA: Insufficient documentation

## 2023-11-05 HISTORY — DX: Encounter for screening for malignant neoplasm of respiratory organs: Z12.2

## 2023-11-05 NOTE — Assessment & Plan Note (Signed)
 On Rosuvastatin  10mg , currently out of medication. -Refill Rosuvastatin  10 mg daily -Check lipids

## 2023-11-05 NOTE — Assessment & Plan Note (Addendum)
Dermatology appointment scheduled.

## 2023-11-05 NOTE — Assessment & Plan Note (Addendum)
 Chronic facial lesions. Started on Tretinoin 0.5% cream  Follows with Dermatology

## 2023-11-05 NOTE — Assessment & Plan Note (Signed)
 Blood pressure 138/68 mmHg, controlled with metoprolol , olmesartan , and chlorthalidone. - Continue current antihypertensive medications. - Refill metoprolol  50 mg daily. - Continue olmesartan  and chlorthalidone , managed by Dr. Cindra Cree, Nephro

## 2023-11-05 NOTE — Assessment & Plan Note (Signed)
 Decreased lung function with occasional cough and exertional dyspnea. Managed with Advair and inhalers. - Continue Advair and inhalers as prescribed. - Follow up with Pulmonology as scheduled - Encourage smoking cessation

## 2023-11-05 NOTE — Assessment & Plan Note (Signed)
 Smokes 1.5 to 2 packs daily. Smoking may worsen dermatological and lung issues. - Encourage smoking cessation. - LDCT referral

## 2023-11-05 NOTE — Progress Notes (Signed)
 SUBJECTIVE:   Chief Complaint  Patient presents with   Diabetes    6 month follow up   HPI Presents to clinic for follow up chronic disease management  Discussed the use of AI scribe software for clinical note transcription with the patient, who gave verbal consent to proceed.  History of Present Illness James Lee is a 73 year old male with diabetes who presents for a follow-up visit.  His A1c is currently 6.9, a decrease of about two points since the last measurement. He is taking Rybelsus  14 mg. His weight is 254 pounds, and he believes it has decreased, although the exact previous weight is unavailable. He is due for blood work today, including kidney function and cholesterol levels, as these tests have not been done recently.  No chest pain or shortness of breath at rest, but he experiences shortness of breath upon exertion, which has been present since retirement. No new swelling in his legs, but he reports numbness managed with gabapentin . He is taking metoprolol  50 mg once daily, Farxiga  10 mg, and chlorthalidone 50 mg. He also takes amlodipine  5 mg, Advair, and metformin  2 tablets twice daily.  He has a dermatology appointment scheduled for Wednesday to evaluate a lesion on his head that started a couple of months ago and has been increasing in size and itching. He also has small lesions on his side, previously attributed to smoking.  He smokes one and a half to two packs of cigarettes per day and is trying to quit. He uses a CPAP machine and reports an occasional cough, which has improved. A lung function test about a week ago showed decreased lung function. He has an upcoming appointment with [facility] on June 3rd.  He has not had a recent colonoscopy, although he was advised to have them every six months due to previous issues. He is uncertain about the scheduling and needs to follow up with the appropriate provider.     PERTINENT PMH / PSH: As above  OBJECTIVE:   BP 138/68   Pulse 84   Temp 98.4 F (36.9 C)   Resp 20   Ht 5\' 11"  (1.803 m)   Wt 254 lb 4 oz (115.3 kg)   SpO2 97%   BMI 35.46 kg/m    Physical Exam Vitals reviewed.  Constitutional:      General: He is not in acute distress.    Appearance: Normal appearance. He is obese. He is not ill-appearing, toxic-appearing or diaphoretic.  Eyes:     General:        Right eye: No discharge.        Left eye: No discharge.  Cardiovascular:     Rate and Rhythm: Normal rate and regular rhythm.     Heart sounds: Normal heart sounds.  Pulmonary:     Effort: Pulmonary effort is normal.     Breath sounds: Normal breath sounds.  Abdominal:     General: Bowel sounds are normal.  Musculoskeletal:        General: Normal range of motion.     Cervical back: Normal range of motion.  Skin:    General: Skin is warm and dry.  Neurological:     Mental Status: He is alert and oriented to person, place, and time. Mental status is at baseline.  Psychiatric:        Mood and Affect: Mood normal.        Behavior: Behavior normal.  Thought Content: Thought content normal.        Judgment: Judgment normal.           10/30/2023    8:52 AM 06/12/2023   12:58 PM 05/02/2023    9:03 AM 10/24/2022    4:32 PM 07/13/2022    8:03 AM  Depression screen PHQ 2/9  Decreased Interest 0 0 1 0 1  Down, Depressed, Hopeless 0 0 0 0 1  PHQ - 2 Score 0 0 1 0 2  Altered sleeping 0 1 0 1 1  Tired, decreased energy 1 1 1 1 1   Change in appetite 1 1 2 1 1   Feeling bad or failure about yourself  0 0 0 0 0  Trouble concentrating 0 0 0 0 0  Moving slowly or fidgety/restless 0 0 0 0 0  Suicidal thoughts 0 0 0 0 0  PHQ-9 Score 2 3 4 3 5   Difficult doing work/chores Not difficult at all Not difficult at all Not difficult at all Not difficult at all Not difficult at all      10/30/2023    8:52 AM 05/02/2023    9:04 AM 10/24/2022    4:33 PM 07/13/2022    8:04 AM  GAD 7 : Generalized Anxiety Score  Nervous,  Anxious, on Edge 0 0 0 0  Control/stop worrying 0 0 0 0  Worry too much - different things 0 1 0 0  Trouble relaxing 0 0 0 0  Restless 0 0 0 0  Easily annoyed or irritable 2 1 1  0  Afraid - awful might happen 0 0 0 0  Total GAD 7 Score 2 2 1  0  Anxiety Difficulty Not difficult at all Not difficult at all Not difficult at all Not difficult at all    ASSESSMENT/PLAN:  Type 2 diabetes mellitus with stage 3b chronic kidney disease, without long-term current use of insulin  (HCC) Assessment & Plan: A1c 6.9, well-controlled. Weight loss indicates effective management with Rybelsus  14 mg. - Refill Rybelsus  14 mg daily. - Refill Farxiga  10 mg daily - Order blood work for kidney function and cholesterol.   Orders: -     Microalbumin / creatinine urine ratio -     POCT glycosylated hemoglobin (Hb A1C) -     Comprehensive metabolic panel with GFR -     Farxiga ; Take 1 tablet (10 mg total) by mouth daily.  Dispense: 90 tablet; Refill: 3 -     Rybelsus ; Take 1 tablet (14 mg total) by mouth daily. -     metFORMIN  HCl ER; Take 2 tablets (1,000 mg total) by mouth 2 (two) times daily with a meal.  Dispense: 360 tablet; Refill: 3  COPD with chronic bronchitis and emphysema (HCC) Assessment & Plan: Decreased lung function with occasional cough and exertional dyspnea. Managed with Advair and inhalers. - Continue Advair and inhalers as prescribed. - Follow up with Pulmonology as scheduled - Encourage smoking cessation   Screening for lung cancer -     Ambulatory Referral for Lung Cancer Scre  Hypertension associated with diabetes (HCC) Assessment & Plan: Blood pressure 138/68 mmHg, controlled with metoprolol , olmesartan , and chlorthalidone. - Continue current antihypertensive medications. - Refill metoprolol  50 mg daily. - Continue olmesartan  and chlorthalidone , managed by Dr. Cindra Cree, Nephro  Orders: -     Comprehensive metabolic panel with GFR  Hyperlipidemia associated with type 2  diabetes mellitus (HCC) Assessment & Plan: On Rosuvastatin  10mg , currently out of medication. -Refill Rosuvastatin  10 mg  daily -Check lipids  Orders: -     Lipid panel -     Rosuvastatin  Calcium ; Take 1 tablet (10 mg total) by mouth every evening.  Dispense: 90 tablet; Refill: 3  Post-nasal drip Assessment & Plan: -Refill Flonase     Orders: -     Fluticasone  Propionate; Place 2 sprays into both nostrils daily.  Dispense: 16 g; Refill: 6  Adenomatous polyp of colon, unspecified part of colon -     Ambulatory referral to Gastroenterology  Diabetic polyneuropathy associated with type 2 diabetes mellitus (HCC) Assessment & Plan: Numbness in legs managed with gabapentin . - Continue gabapentin . - Refill gabapentin .  Orders: -     Gabapentin ; Take 2 capsules (600 mg total) by mouth 2 (two) times daily.  Dispense: 360 capsule; Refill: 3  Tobacco use Assessment & Plan: Smokes 1.5 to 2 packs daily. Smoking may worsen dermatological and lung issues. - Encourage smoking cessation. - LDCT referral   Seborrheic keratosis of scalp Assessment & Plan: Dermatology appointment scheduled.     [place] and Racouchot syndrome Assessment & Plan: Chronic facial lesions. Started on Tretinoin 0.5% cream  Follows with Dermatology     PDMP reviewed  Return if symptoms worsen or fail to improve, for PCP.  Valli Gaw, MD

## 2023-11-05 NOTE — Assessment & Plan Note (Signed)
 A1c 6.9, well-controlled. Weight loss indicates effective management with Rybelsus  14 mg. - Refill Rybelsus  14 mg daily. - Refill Farxiga  10 mg daily - Order blood work for kidney function and cholesterol.

## 2023-11-05 NOTE — Assessment & Plan Note (Signed)
 Numbness in legs managed with gabapentin . - Continue gabapentin . - Refill gabapentin .

## 2023-11-05 NOTE — Assessment & Plan Note (Signed)
 Refill Flonase.

## 2023-11-09 ENCOUNTER — Ambulatory Visit
Admission: RE | Admit: 2023-11-09 | Discharge: 2023-11-09 | Disposition: A | Discharge status: Home / Self Care | Source: Ambulatory Visit | Attending: Acute Care | Admitting: Acute Care

## 2023-11-09 DIAGNOSIS — Z87891 Personal history of nicotine dependence: Secondary | ICD-10-CM | POA: Diagnosis not present

## 2023-11-09 DIAGNOSIS — Z122 Encounter for screening for malignant neoplasm of respiratory organs: Secondary | ICD-10-CM | POA: Insufficient documentation

## 2023-11-09 DIAGNOSIS — F17210 Nicotine dependence, cigarettes, uncomplicated: Secondary | ICD-10-CM | POA: Insufficient documentation

## 2023-11-10 ENCOUNTER — Other Ambulatory Visit

## 2023-11-10 DIAGNOSIS — N1832 Chronic kidney disease, stage 3b: Secondary | ICD-10-CM | POA: Diagnosis not present

## 2023-11-11 DIAGNOSIS — G4733 Obstructive sleep apnea (adult) (pediatric): Secondary | ICD-10-CM | POA: Diagnosis not present

## 2023-11-13 ENCOUNTER — Encounter: Payer: Self-pay | Admitting: Family Medicine

## 2023-11-14 DIAGNOSIS — E1129 Type 2 diabetes mellitus with other diabetic kidney complication: Secondary | ICD-10-CM | POA: Diagnosis not present

## 2023-11-14 DIAGNOSIS — N1832 Chronic kidney disease, stage 3b: Secondary | ICD-10-CM | POA: Diagnosis not present

## 2023-11-14 DIAGNOSIS — I1A0 Resistant hypertension: Secondary | ICD-10-CM | POA: Diagnosis not present

## 2023-11-14 DIAGNOSIS — E039 Hypothyroidism, unspecified: Secondary | ICD-10-CM | POA: Diagnosis not present

## 2023-11-15 ENCOUNTER — Encounter: Payer: Self-pay | Admitting: Pharmacist

## 2023-11-15 ENCOUNTER — Other Ambulatory Visit: Payer: Self-pay | Admitting: Family Medicine

## 2023-11-15 NOTE — Progress Notes (Unsigned)
 Patient Assistance Program (PAP) Application   Manufacturer: Novo Nordisk    (Currently enrolled  -  dose change request) Medication(s): Stop Rybelsus  14 mg, Start Ozempic 1.0 mg   Medication Change Request Form 11/15/23: Filled out by PharmD, uploaded to PCP eFax folder for review/signature.   Prescription(s): Ozempic 1.0 mg pen x2 boxes Ozemic 2.0 mg pen x2 boxes  (if does not tolerate, can microdose down to 1.0 mg dose)   Forwarded to [employer] CPhT Patient Advocate Team for future correspondences/re-enrollment.   Note routed to PCP Clinic Pool to ensure PCP signature is obtained and application is faxed.

## 2023-11-17 ENCOUNTER — Ambulatory Visit: Admitting: Podiatry

## 2023-11-17 ENCOUNTER — Encounter: Payer: Self-pay | Admitting: Podiatry

## 2023-11-17 VITALS — Ht 71.0 in | Wt 254.2 lb

## 2023-11-17 DIAGNOSIS — M79675 Pain in left toe(s): Secondary | ICD-10-CM | POA: Diagnosis not present

## 2023-11-17 DIAGNOSIS — M79674 Pain in right toe(s): Secondary | ICD-10-CM

## 2023-11-17 DIAGNOSIS — B351 Tinea unguium: Secondary | ICD-10-CM | POA: Diagnosis not present

## 2023-11-17 DIAGNOSIS — E1142 Type 2 diabetes mellitus with diabetic polyneuropathy: Secondary | ICD-10-CM

## 2023-11-20 ENCOUNTER — Telehealth: Payer: Self-pay

## 2023-11-20 DIAGNOSIS — N5082 Scrotal pain: Secondary | ICD-10-CM | POA: Diagnosis not present

## 2023-11-20 NOTE — Telephone Encounter (Signed)
 okay

## 2023-11-20 NOTE — Telephone Encounter (Signed)
Pt called requesting call back to schedule colonoscopy

## 2023-11-21 ENCOUNTER — Telehealth: Payer: Self-pay | Admitting: Cardiology

## 2023-11-21 NOTE — Telephone Encounter (Signed)
 Error

## 2023-11-23 DIAGNOSIS — E1142 Type 2 diabetes mellitus with diabetic polyneuropathy: Secondary | ICD-10-CM | POA: Diagnosis not present

## 2023-11-25 ENCOUNTER — Encounter: Payer: Self-pay | Admitting: Podiatry

## 2023-11-25 DIAGNOSIS — E1165 Type 2 diabetes mellitus with hyperglycemia: Secondary | ICD-10-CM | POA: Diagnosis not present

## 2023-11-25 NOTE — Progress Notes (Signed)
  Subjective:  Patient ID: James Lee, male    DOB: [birth year],  MRN: [id number]  James Lee presents to clinic today for at risk foot care with history of diabetic neuropathy and painful mycotic toenails x 10 which interfere with daily activities. Pain is relieved with periodic professional debridement.  Chief Complaint  Patient presents with   Nail Problem    Pt is here for [facility] last A1C was 6.4 PCP is Dr Sueanne Emerald and LOV was last week.   New problem(s): None.   PCP is Valli Gaw, MD.  No Known Allergies  Review of Systems: Negative except as noted in the HPI.  Objective: No changes noted in today's physical examination. There were no vitals filed for this visit. James Lee is a pleasant 73 y.o. male obese in NAD. AAO x 3.  Vascular Examination: Capillary refill time immediate b/l. Palpable pedal pulses. Pedal hair present b/l. No pain with calf compression b/l. Skin temperature gradient WNL b/l. No cyanosis or clubbing b/l. No ischemia or gangrene noted b/l.   Neurological Examination: .Pt has subjective symptoms of neuropathy. Sensation grossly intact b/l with 10 gram monofilament. Vibratory sensation intact b/l.   Dermatological Examination: Pedal skin with normal turgor, texture and tone b/l.  No open wounds. No interdigital macerations.   Toenails 1-5 b/l thick, discolored, elongated with subungual debris and pain on dorsal palpation.   No hyperkeratotic nor porokeratotic lesions present on today's visit.  Musculoskeletal Examination: Muscle strength 5/5 to all LE muscle groups of b/l lower extremities. Hammertoe(s) R 2nd toe.  Radiographs: None  Last A1c:      Latest Ref Rng & Units 10/30/2023    9:07 AM 05/02/2023    9:05 AM  Hemoglobin A1C  Hemoglobin-A1c 4.0 - 5.6 % 6.9  7.3     Assessment/Plan: 1. Pain due to onychomycosis of toenails of both feet   2. Diabetic polyneuropathy associated with type 2 diabetes mellitus [facility])   Consent given for treatment.  Patient examined. All patient's and/or POA's questions/concerns addressed on today's visit. Toenails 1-5 debrided in length and girth without incident. Continue foot and shoe inspections daily. Monitor blood glucose per PCP/Endocrinologist's recommendations. Continue soft, supportive shoe gear daily. Report any pedal injuries to medical professional. Call office if there are any questions/concerns. -Patient/POA to call should there be question/concern in the interim.   Return in about 3 months (around 02/17/2024).  James Lee, DPM      [place] LOCATION: [address] [place] [address]                   Office [phone]   [facility] LOCATION: [address] [place], [place] [address] Office [phone]

## 2023-11-29 ENCOUNTER — Ambulatory Visit: Admitting: Dermatology

## 2023-11-29 ENCOUNTER — Encounter: Payer: Self-pay | Admitting: Dermatology

## 2023-11-29 ENCOUNTER — Other Ambulatory Visit: Payer: Self-pay | Admitting: Acute Care

## 2023-11-29 DIAGNOSIS — D492 Neoplasm of unspecified behavior of bone, soft tissue, and skin: Secondary | ICD-10-CM

## 2023-11-29 DIAGNOSIS — Z87891 Personal history of nicotine dependence: Secondary | ICD-10-CM

## 2023-11-29 DIAGNOSIS — Z122 Encounter for screening for malignant neoplasm of respiratory organs: Secondary | ICD-10-CM

## 2023-11-29 DIAGNOSIS — F17210 Nicotine dependence, cigarettes, uncomplicated: Secondary | ICD-10-CM

## 2023-11-29 DIAGNOSIS — L720 Epidermal cyst: Secondary | ICD-10-CM | POA: Diagnosis not present

## 2023-11-29 MED ORDER — MUPIROCIN 2 % EX OINT: TOPICAL_OINTMENT | CUTANEOUS | 0 refills | Status: DC

## 2023-11-29 NOTE — Progress Notes (Signed)
   Follow-Up Visit   Subjective  James Lee is a 73 y.o. male who presents for the following: Excision of a cyst on the mid to upper back spinal  The following portions of the chart were reviewed this encounter and updated as appropriate: medications, allergies, medical history  Review of Systems:  No other skin or systemic complaints except as noted in HPI or Assessment and Plan.  Objective  Well appearing patient in no apparent distress; mood and affect are within normal limits.  A focused examination was performed of the following areas: back  Relevant physical exam findings are noted in the Assessment and Plan.   mid to upper back spinal Subcutaneous nodule.   Assessment & Plan   NEOPLASM OF SKIN mid to upper back spinal Skin excision  Excision method:  elliptical Total excision diameter (cm):  1.9 Informed consent: discussed and consent obtained   Timeout: patient name, date of birth, surgical site, and procedure verified   Procedure prep:  Patient was prepped and draped in usual sterile fashion Prep type:  Chlorhexidine  Anesthesia: the lesion was anesthetized in a standard fashion   Anesthetic:  1% lidocaine  w/ epinephrine  1-100,000 buffered w/ 8.4% NaHCO3 (12 cc) Instrument used: #15 blade   Hemostasis achieved with: suture, pressure and electrodesiccation   Outcome: patient tolerated procedure well with no complications    Skin repair Complexity:  Intermediate Final length (cm):  3.9 Informed consent: discussed and consent obtained   Timeout: patient name, date of birth, surgical site, and procedure verified   Procedure prep:  Patient was prepped and draped in usual sterile fashion Prep type:  Chlorhexidine  Anesthesia: the lesion was anesthetized in a standard fashion   Anesthetic:  1% lidocaine  w/ epinephrine  1-100,000 buffered w/ 8.4% NaHCO3 Reason for type of repair: reduce tension to allow closure, reduce the risk of dehiscence, infection, and necrosis,  reduce subcutaneous dead space and avoid a hematoma, allow closure of the large defect and preserve normal anatomy   Undermining: edges could be approximated without difficulty   Subcutaneous layers (deep stitches):  Suture size:  4-0 and 3-0 Suture type: Monocryl (poliglecaprone 25)   Stitches:  Buried vertical mattress Fine/surface layer approximation (top stitches):  Suture size:  5-0 and 4-0 Suture type: Prolene (polypropylene)   Stitches comment:  Running locked Hemostasis achieved with: suture, pressure and electrodesiccation Outcome: patient tolerated procedure well with no complications   Post-procedure details: sterile dressing applied and wound care instructions given   Dressing type: petrolatum , bandage and pressure dressing   Specimen 1 - Surgical pathology Differential Diagnosis: R/O Epidermal Cyst   Check Margins: No   Return in about 2 weeks (around 12/13/2023) for suture removal .  I, Clara Crisp, CMA, am acting as scribe for Harris Liming, MD .   Documentation: I have reviewed the above documentation for accuracy and completeness, and I agree with the above.  Harris Liming, MD

## 2023-11-29 NOTE — Patient Instructions (Signed)
 Wound Care Instructions  On the day following your surgery, you should begin doing daily dressing changes: Remove the old dressing and discard it. Cleanse the wound gently with tap water. This may be done in the shower or by placing a wet gauze pad directly on the wound and letting it soak for several minutes. It is important to gently remove any dried blood from the wound in order to encourage healing. This may be done by gently rolling a moistened Q-tip on the dried blood. Do not pick at the wound. If the wound should start to bleed, continue cleaning the wound, then place a moist gauze pad on the wound and hold pressure for a few minutes.  Make sure you then dry the skin surrounding the wound completely or the tape will not stick to the skin. Do not use cotton balls on the wound. After the wound is clean and dry, apply the ointment gently with a Q-tip. Cut a non-stick pad to fit the size of the wound. Lay the pad flush to the wound. If the wound is draining, you may want to reinforce it with a small amount of gauze on top of the non-stick pad for a little added compression to the area. Use the tape to seal the area completely. Select from the following with respect to your individual situation: If your wound has been stitched closed: continue the above steps 1-8 at least daily until your sutures are removed. If your wound has been left open to heal: continue steps 1-8 at least daily for the first 3-4 weeks. We would like for you to take a few extra precautions for at least the next week. Sleep with your head elevated on pillows if our wound is on your head. Do not bend over or lift heavy items to reduce the chance of elevated blood pressure to the wound Do not participate in particularly strenuous activities.   Below is a list of dressing supplies you might need.  Cotton-tipped applicators - Q-tips Gauze pads (2x2 and/or 4x4) - All-Purpose Sponges Non-stick dressing material - Telfa Tape -  Paper or Hypafix New and clean tube of petroleum jelly - Vaseline    Comments on Post-Operative Period Slight swelling and redness often appear around the wound. This is normal and will disappear within several days following the surgery. The healing wound will drain a brownish-red-yellow discharge during healing. This is a normal phase of wound healing. As the wound begins to heal, the drainage may increase in amount. Again, this drainage is normal. Notify us if the drainage becomes persistently bloody, excessively swollen, or intensely painful or develops a foul odor or red streaks.  If you should experience mild discomfort during the healing phase, you may take an aspirin-free medication such as Tylenol (acetaminophen). Notify us if the discomfort is severe or persistent. Avoid alcoholic beverages when taking pain medicine.  In Case of Wound Hemorrhage A wound hemorrhage is when the bandage suddenly becomes soaked with bright red blood and flows profusely. If this happens, sit down or lie down with your head elevated. If the wound has a dressing on it, do not remove the dressing. Apply pressure to the existing gauze. If the wound is not covered, use a gauze pad to apply pressure and continue applying the pressure for 20 minutes without peeking. DO NOT COVER THE WOUND WITH A LARGE TOWEL OR WASH CLOTH. Release your hand from the wound site but do not remove the dressing. If the bleeding has stopped,  gently clean around the wound. Leave the dressing in place for 24 hours if possible. This wait time allows the blood vessels to close off so that you do not spark a new round of bleeding by disrupting the newly clotted blood vessels with an immediate dressing change. If the bleeding does not subside, continue to hold pressure. If matters are out of your control, contact an After Hours clinic or go to the Emergency Room.

## 2023-11-30 DIAGNOSIS — K080 Exfoliation of teeth due to systemic causes: Secondary | ICD-10-CM | POA: Diagnosis not present

## 2023-12-01 ENCOUNTER — Ambulatory Visit: Payer: Self-pay | Admitting: Dermatology

## 2023-12-01 DIAGNOSIS — N393 Stress incontinence (female) (male): Secondary | ICD-10-CM | POA: Diagnosis not present

## 2023-12-01 DIAGNOSIS — N433 Hydrocele, unspecified: Secondary | ICD-10-CM | POA: Diagnosis not present

## 2023-12-01 DIAGNOSIS — F17210 Nicotine dependence, cigarettes, uncomplicated: Secondary | ICD-10-CM | POA: Diagnosis not present

## 2023-12-01 DIAGNOSIS — N35914 Unspecified anterior urethral stricture, male: Secondary | ICD-10-CM | POA: Diagnosis not present

## 2023-12-01 DIAGNOSIS — C61 Malignant neoplasm of prostate: Secondary | ICD-10-CM | POA: Diagnosis not present

## 2023-12-01 LAB — SURGICAL PATHOLOGY

## 2023-12-04 NOTE — Telephone Encounter (Signed)
 Left pt msg to call for bx result/sh

## 2023-12-04 NOTE — Telephone Encounter (Signed)
-----   Message from [place] sent at 12/01/2023  6:09 PM EDT ----- Diagnosis mid to upper back spinal :       EPIDERMAL INCLUSION CYST, COMPLETELY EXCISED    Please call to share that excision fully removed benign cyst and get update on surgical wound. Thank you. ----- Message ----- From: Interface, Lab In [facility] One Sent: 12/01/2023  11:55 AM EDT To: Boneta Sharps, MD

## 2023-12-12 ENCOUNTER — Telehealth: Payer: Self-pay

## 2023-12-12 NOTE — Telephone Encounter (Signed)
 Faxed provider for ozempic ( [employer]) patient portion is in media tab with income document that [employer] requested earlier in the year.

## 2023-12-13 ENCOUNTER — Ambulatory Visit: Admitting: Dermatology

## 2023-12-13 DIAGNOSIS — Z5189 Encounter for other specified aftercare: Secondary | ICD-10-CM

## 2023-12-13 DIAGNOSIS — Z48817 Encounter for surgical aftercare following surgery on the skin and subcutaneous tissue: Secondary | ICD-10-CM

## 2023-12-13 DIAGNOSIS — Z4802 Encounter for removal of sutures: Secondary | ICD-10-CM

## 2023-12-13 NOTE — Patient Instructions (Signed)

## 2023-12-13 NOTE — Progress Notes (Signed)
   Follow-Up Visit   Subjective  James Lee is a 73 y.o. male who presents for the following: Suture removal mid to upper back   Pathology showed EPIDERMAL INCLUSION CYST, COMPLETELY EXCISED   Wife is with patient and contributes to history.   The following portions of the chart were reviewed this encounter and updated as appropriate: medications, allergies, medical history  Review of Systems:  No other skin or systemic complaints except as noted in HPI or Assessment and Plan.  Objective  Well appearing patient in no apparent distress; mood and affect are within normal limits.  Areas Examined: Back   Relevant physical exam findings are noted in the Assessment and Plan.    Assessment & Plan   VISIT FOR WOUND CHECK   ENCOUNTER FOR REMOVAL OF SUTURES   Encounter for Removal of Sutures - Incision site is clean, dry and intact. - Wound cleansed, sutures removed, wound cleansed and steri strips applied.  - Discussed pathology results showing EPIDERMAL INCLUSION CYST, COMPLETELY EXCISED  - Patient advised to keep steri-strips dry until they fall off. - Scars remodel for a full year. - Once steri-strips fall off, patient can apply over-the-counter silicone scar cream once to twice a day to help with scar remodeling if desired. - Patient advised to call with any concerns or if they notice any new or changing lesions.  Return if symptoms worsen or fail to improve.  IFay Kirks, CMA, am acting as scribe for Boneta Sharps, MD .   Documentation: I have reviewed the above documentation for accuracy and completeness, and I agree with the above.  Boneta Sharps, MD

## 2023-12-18 DIAGNOSIS — Z7982 Long term (current) use of aspirin: Secondary | ICD-10-CM | POA: Diagnosis not present

## 2023-12-18 DIAGNOSIS — I4510 Unspecified right bundle-branch block: Secondary | ICD-10-CM | POA: Diagnosis not present

## 2023-12-18 DIAGNOSIS — N184 Chronic kidney disease, stage 4 (severe): Secondary | ICD-10-CM | POA: Diagnosis not present

## 2023-12-18 DIAGNOSIS — N433 Hydrocele, unspecified: Secondary | ICD-10-CM | POA: Diagnosis not present

## 2023-12-18 DIAGNOSIS — Q5529 Other congenital malformations of testis and scrotum: Secondary | ICD-10-CM | POA: Diagnosis not present

## 2023-12-18 DIAGNOSIS — I129 Hypertensive chronic kidney disease with stage 1 through stage 4 chronic kidney disease, or unspecified chronic kidney disease: Secondary | ICD-10-CM | POA: Diagnosis not present

## 2023-12-18 DIAGNOSIS — Q554 Other congenital malformations of vas deferens, epididymis, seminal vesicles and prostate: Secondary | ICD-10-CM | POA: Diagnosis not present

## 2023-12-18 DIAGNOSIS — Z9689 Presence of other specified functional implants: Secondary | ICD-10-CM | POA: Diagnosis not present

## 2023-12-18 DIAGNOSIS — Z7984 Long term (current) use of oral hypoglycemic drugs: Secondary | ICD-10-CM | POA: Diagnosis not present

## 2023-12-18 DIAGNOSIS — Z79899 Other long term (current) drug therapy: Secondary | ICD-10-CM | POA: Diagnosis not present

## 2023-12-25 NOTE — Telephone Encounter (Signed)
 PAP: Application for Ozempic has been submitted to [employer], via fax WITH INCOME DOCUMENT PROVIDED.

## 2023-12-27 NOTE — Telephone Encounter (Signed)
 Reached out to patient after receiving letter from [employer] for additional information -. He said he would comply with sending tax document also and a copy of the letter is in the Media . Waiting on Patient to take document to Providers office.

## 2024-01-08 DIAGNOSIS — N35914 Unspecified anterior urethral stricture, male: Secondary | ICD-10-CM | POA: Diagnosis not present

## 2024-01-08 DIAGNOSIS — N393 Stress incontinence (female) (male): Secondary | ICD-10-CM | POA: Diagnosis not present

## 2024-01-08 DIAGNOSIS — C61 Malignant neoplasm of prostate: Secondary | ICD-10-CM | POA: Diagnosis not present

## 2024-01-08 DIAGNOSIS — N433 Hydrocele, unspecified: Secondary | ICD-10-CM | POA: Diagnosis not present

## 2024-01-18 ENCOUNTER — Encounter
Admission: RE | Disposition: A | Discharge status: Home / Self Care | Payer: Self-pay | Source: Home / Self Care | Attending: Gastroenterology

## 2024-01-18 ENCOUNTER — Encounter: Payer: Self-pay | Admitting: Gastroenterology

## 2024-01-18 ENCOUNTER — Ambulatory Visit: Admitting: Anesthesiology

## 2024-01-18 ENCOUNTER — Ambulatory Visit
Admission: RE | Admit: 2024-01-18 | Discharge: 2024-01-18 | Disposition: A | Discharge status: Home / Self Care | Attending: Gastroenterology | Admitting: Gastroenterology

## 2024-01-18 DIAGNOSIS — D123 Benign neoplasm of transverse colon: Secondary | ICD-10-CM | POA: Diagnosis not present

## 2024-01-18 DIAGNOSIS — G4730 Sleep apnea, unspecified: Secondary | ICD-10-CM | POA: Diagnosis not present

## 2024-01-18 DIAGNOSIS — F17200 Nicotine dependence, unspecified, uncomplicated: Secondary | ICD-10-CM | POA: Diagnosis not present

## 2024-01-18 DIAGNOSIS — E119 Type 2 diabetes mellitus without complications: Secondary | ICD-10-CM | POA: Insufficient documentation

## 2024-01-18 DIAGNOSIS — D124 Benign neoplasm of descending colon: Secondary | ICD-10-CM | POA: Diagnosis not present

## 2024-01-18 DIAGNOSIS — E66813 Obesity, class 3: Secondary | ICD-10-CM | POA: Diagnosis not present

## 2024-01-18 DIAGNOSIS — K635 Polyp of colon: Secondary | ICD-10-CM | POA: Diagnosis not present

## 2024-01-18 DIAGNOSIS — Z09 Encounter for follow-up examination after completed treatment for conditions other than malignant neoplasm: Secondary | ICD-10-CM | POA: Diagnosis not present

## 2024-01-18 DIAGNOSIS — Z1211 Encounter for screening for malignant neoplasm of colon: Secondary | ICD-10-CM | POA: Diagnosis not present

## 2024-01-18 DIAGNOSIS — J449 Chronic obstructive pulmonary disease, unspecified: Secondary | ICD-10-CM | POA: Diagnosis not present

## 2024-01-18 DIAGNOSIS — I10 Essential (primary) hypertension: Secondary | ICD-10-CM | POA: Diagnosis not present

## 2024-01-18 DIAGNOSIS — Z7984 Long term (current) use of oral hypoglycemic drugs: Secondary | ICD-10-CM | POA: Insufficient documentation

## 2024-01-18 DIAGNOSIS — I2510 Atherosclerotic heart disease of native coronary artery without angina pectoris: Secondary | ICD-10-CM | POA: Insufficient documentation

## 2024-01-18 DIAGNOSIS — K219 Gastro-esophageal reflux disease without esophagitis: Secondary | ICD-10-CM | POA: Insufficient documentation

## 2024-01-18 HISTORY — PX: COLONOSCOPY: SHX5424

## 2024-01-18 HISTORY — PX: POLYPECTOMY: SHX149

## 2024-01-18 LAB — GLUCOSE, CAPILLARY: Glucose-Capillary: 159 mg/dL — ABNORMAL HIGH (ref 70–99)

## 2024-01-18 SURGERY — COLONOSCOPY
Anesthesia: General

## 2024-01-18 MED ORDER — LIDOCAINE HCL (PF) 2 % IJ SOLN
INTRAMUSCULAR | Status: DC | PRN
  Administered 2024-01-18: 40 mg via INTRADERMAL

## 2024-01-18 MED ORDER — PROPOFOL 10 MG/ML IV BOLUS
INTRAVENOUS | Status: DC | PRN
  Administered 2024-01-18 (×2): 20 mg via INTRAVENOUS
  Administered 2024-01-18: 40 mg via INTRAVENOUS
  Administered 2024-01-18: 50 mg via INTRAVENOUS
  Administered 2024-01-18 (×3): 20 mg via INTRAVENOUS

## 2024-01-18 MED ORDER — PROPOFOL 1000 MG/100ML IV EMUL
INTRAVENOUS | Status: AC
Start: 2024-01-18 — End: 2024-01-18
  Filled 2024-01-18: qty 100

## 2024-01-18 MED ORDER — SODIUM CHLORIDE 0.9 % IV SOLN: INTRAVENOUS | Status: DC

## 2024-01-18 NOTE — H&P (Signed)
 Ruel Kung , MD [address], [address], [place], [place], [address] Phone: [phone] Fax: [phone]  Primary Care Physician:  Abbey Bruckner, MD   Pre-Procedure History & Physical: HPI:  James Lee is a 73 y.o. male is here for an colonoscopy.   Past Medical History:  Diagnosis Date   Bacterial conjunctivitis of both eyes 02/27/2022   Benign essential HTN 09/02/2014   Bronchitis 03/14/2022   Cancer of prostate (HCC) 03/08/2011   Colon cancer screening 05/04/2022   Colon polyps    Coronary artery calcification seen on CAT scan    Coronary artery calcification seen on CAT scan 09/02/2014   Diabetes mellitus without complication (HCC)    Dyslipidemia    Elevated serum creatinine 11/11/2021   Gastric ulcer 11/29/2017   GERD (gastroesophageal reflux disease)    History of colonic polyps 05/03/2022   Hyperlipidemia    Hypertension    Lactic acidosis 11/11/2021   LBP (low back pain)    Male erectile dysfunction, unspecified 06/21/2011   NIDDM-2 with hyperglycemia 11/11/2021   Perforated bowel (HCC) 11/29/2017   Personal history of prostate cancer 08/16/2012   Prostate cancer (HCC) 2014   prostate    RBBB    RBBB 04/27/2021   Reduced libido 08/16/2012   Scrotal pain 08/16/2012   Sleep apnea    c-pap wears   Syncope 11/10/2021    Past Surgical History:  Procedure Laterality Date   ABDOMINAL SURGERY     gastric ulcer   back sugery     1996, 2017, 2018   BACK SURGERY  1996   2017, 2018, 2019   CATARACT EXTRACTION Bilateral 2024   CATARACT EXTRACTION W/PHACO Left 09/14/2022   Procedure: CATARACT EXTRACTION PHACO AND INTRAOCULAR LENS PLACEMENT (IOC) LEFT DIABETIC 16.26 01:17.3;  Surgeon: Mittie Gaskin, MD;  Location: [facility] SURGERY [facility]TR;  Service: Ophthalmology;  Laterality: Left;   CATARACT EXTRACTION W/PHACO Right 09/28/2022   Procedure: CATARACT EXTRACTION PHACO AND INTRAOCULAR LENS PLACEMENT (IOC) RIGHT DIABETIC  9.93  00:49.4;  Surgeon:  Mittie Gaskin, MD;  Location: [facility] SURGERY [facility]TR;  Service: Ophthalmology;  Laterality: Right;  Diabetic   COLONOSCOPY WITH PROPOFOL  N/A 05/03/2022   Procedure: COLONOSCOPY WITH PROPOFOL ;  Surgeon: Kung Ruel, MD;  Location: [facility] ENDOSCOPY;  Service: Gastroenterology;  Laterality: N/A;   COLONOSCOPY WITH PROPOFOL  N/A 05/04/2022   Procedure: COLONOSCOPY WITH PROPOFOL ;  Surgeon: Kung Ruel, MD;  Location: [facility] ENDOSCOPY;  Service: Gastroenterology;  Laterality: N/A;   COLONOSCOPY WITH PROPOFOL  N/A 11/02/2022   Procedure: COLONOSCOPY WITH PROPOFOL ;  Surgeon: Kung Ruel, MD;  Location: [facility] ENDOSCOPY;  Service: Gastroenterology;  Laterality: N/A;   gastric ulcers     surgery 11-28-17   INGUINAL HERNIA REPAIR     right   LAPAROSCOPY N/A 11/28/2017   Procedure: LAPAROSCOPY DIAGNOSTIC converted to open;  Surgeon: Rubin Calamity, MD;  Location: [facility] OR;  Service: General;  Laterality: N/A;   LAPAROTOMY N/A 11/28/2017   Procedure: EXPLORATORY LAPAROTOMY;  Surgeon: Rubin Calamity, MD;  Location: [facility] OR;  Service: General;  Laterality: N/A;   MENISCUS REPAIR Right    POSTERIOR CERVICAL FUSION/FORAMINOTOMY N/A 11/10/2017   Procedure: Posterior cervical fusion with lateral mass fixation Cervical Three-Seven, cervical laminectomy Cervical Three- Seven;  Surgeon: Joshua Alm RAMAN, MD;  Location: [facility] OR;  Service: Neurosurgery;  Laterality: N/A;  Posterior cervical fusion with lateral mass fixation Cervical Three-Seven, cervical laminectomy Cervical Three- Seven   PROSTATECTOMY  01/2012   TONSILLECTOMY     TOOTH EXTRACTION  Prior to Admission medications   Medication Sig Start Date End Date Taking? Authorizing Provider  albuterol  (VENTOLIN  HFA) 108 (90 Base) MCG/ACT inhaler Inhale 2 puffs into the lungs every 6 (six) hours as needed for wheezing or shortness of breath. 03/22/23  Yes Cobb, Comer GAILS, NP  amLODipine  (NORVASC ) 5 MG tablet Take 5 mg by mouth daily.   Yes [provider]   aspirin  81 MG tablet Take 1 tablet (81 mg total) by mouth daily. 03/16/18  Yes Costella, Jerrell PARAS, PA-C  chlorthalidone (HYGROTON) 50 MG tablet Take 50 mg by mouth daily. 12/31/21  Yes [provider]  gabapentin  (NEURONTIN ) 300 MG capsule Take 2 capsules (600 mg total) by mouth 2 (two) times daily. 10/30/23  Yes Hope Merle, MD  metFORMIN  (GLUCOPHAGE -XR) 500 MG 24 hr tablet Take 2 tablets (1,000 mg total) by mouth 2 (two) times daily with a meal. 10/30/23  Yes Hope Merle, MD  metoprolol  succinate (TOPROL -XL) 50 MG 24 hr tablet TAKE 1 TABLET(50 MG) BY MOUTH DAILY 03/15/23  Yes West, Katlyn D, NP  rosuvastatin  (CRESTOR ) 10 MG tablet Take 1 tablet (10 mg total) by mouth every evening. 10/30/23  Yes Hope Merle, MD  Continuous Glucose Sensor (FREESTYLE LIBRE 2 SENSOR) MISC Place 1 sensor to skin every 14 days 09/15/23   Hope Merle, MD  docusate sodium  (COLACE) 100 MG capsule Take 100 mg by mouth daily.    [provider]  FARXIGA  10 MG TABS tablet Take 1 tablet (10 mg total) by mouth daily. 10/30/23   Hope Merle, MD  fluticasone  (FLONASE ) 50 MCG/ACT nasal spray Place 2 sprays into both nostrils daily. 10/30/23   Hope Merle, MD  fluticasone -salmeterol (WIXELA INHUB) 100-50 MCG/ACT AEPB Inhale 1 puff into the lungs 2 (two) times daily. Patient taking differently: Inhale 1 puff into the lungs as needed. 03/31/23   Cobb, Comer GAILS, NP  Multiple Vitamins-Minerals (MULTIVITAMIN WITH MINERALS) tablet Take 1 tablet by mouth daily.    [provider]  mupirocin  ointment (BACTROBAN ) 2 % Apply to skin qd-bid 11/29/23   Claudene Lehmann, MD  NONFORMULARY OR COMPOUNDED ITEM PERIPHERAL NEUROPATHY CREAM (BUPIVACAINE  HCL MONOHYDRATE HCL POWDER, DOXEPIN HCL  POWDER, GABAPENTIN   POWDER   Sig APPLY ONE (1) TO TWO (2) GRAMS ( ONE (1) TO TWO (2) PUMPS) TO AFFECTED AREA THREE (3) TO FOUR (4) TIMES DAILY 11/03/23   Gaynel Delon CROME, DPM  olmesartan  (BENICAR ) 40 MG tablet Take 40 mg by  mouth daily. 06/20/22   [provider]  Semaglutide  (RYBELSUS ) 14 MG TABS Take 1 tablet (14 mg total) by mouth daily. 10/30/23   Hope Merle, MD  tretinoin  (RETIN-A ) 0.05 % cream Apply pea sized amount nightly to face as tolerated 11/01/23   Hester Alm BROCKS, MD    Allergies as of 12/25/2023   (No Known Allergies)    Family History  Problem Relation Age of Onset   Hypertension Mother    Hyperlipidemia Mother    Early death Mother    Drug abuse Mother    Alcohol abuse Mother    Coronary artery disease Mother    Throat cancer Father    Esophageal cancer Father        dx late 43s, d. 52   Diabetes Sister    Depression Sister    Prostate cancer Brother 34   Colon cancer Neg Hx    Rectal cancer Neg Hx    Stomach cancer Neg Hx     Social History   Socioeconomic  History   Marital status: Married    Spouse name: Not on file   Number of children: 2   Years of education: Not on file   Highest education level: Not on file  Occupational History   Occupation: Tel Psychologist, prison and probation services: [employer]  Tobacco Use   Smoking status: Every Day    Current packs/day: 2.00    Average packs/day: 1.5 packs/day for 50.8 years (76.7 ttl pk-yrs)    Types: Cigarettes    Start date: 03/13/2012   Smokeless tobacco: Never   Tobacco comments:    Has gone up to 2 ppd 10/20/23  Vaping Use   Vaping status: Never Used  Substance and Sexual Activity   Alcohol use: Yes    Comment: occasionally   Drug use: No   Sexual activity: Not on file  Other Topics Concern   Not on file  Social History Narrative   Not on file   Social Drivers of Health   Financial Resource Strain: Low Risk  (06/12/2023)   Overall Financial Resource Strain (CARDIA)    Difficulty of Paying Living Expenses: Not hard at all  Food Insecurity: No Food Insecurity (06/12/2023)   Hunger Vital Sign    Worried About Running Out of Food in the Last Year: Never true    Ran Out of Food in the Last Year: Never true   Transportation Needs: No Transportation Needs (06/12/2023)   PRAPARE - Administrator, Civil Service (Medical): No    Lack of Transportation (Non-Medical): No  Physical Activity: Inactive (06/12/2023)   Exercise Vital Sign    Days of Exercise per Week: 0 days    Minutes of Exercise per Session: 0 min  Stress: No Stress Concern Present (06/12/2023)   [employer] of Occupational Health - Occupational Stress Questionnaire    Feeling of Stress : Only a little  Social Connections: Moderately Isolated (06/12/2023)   Social Connection and Isolation Panel    Frequency of Communication with Friends and Family: More than three times a week    Frequency of Social Gatherings with Friends and Family: More than three times a week    Attends Religious Services: Never    Database administrator or Organizations: No    Attends Banker Meetings: Never    Marital Status: Married  Catering manager Violence: Not At Risk (06/12/2023)   Humiliation, Afraid, Rape, and Kick questionnaire    Fear of Current or Ex-Partner: No    Emotionally Abused: No    Physically Abused: No    Sexually Abused: No    Review of Systems: See HPI, otherwise negative ROS  Physical Exam: BP (!) 156/80   Pulse 86   Temp (!) 97 F (36.1 C) (Temporal)   Resp 18   Wt 108.9 kg   SpO2 98%   BMI 33.47 kg/m  General:   Alert,  pleasant and cooperative in NAD Head:  Normocephalic and atraumatic. Neck:  Supple; no masses or thyromegaly. Lungs:  Clear throughout to auscultation, normal respiratory effort.    Heart:  +S1, +S2, Regular rate and rhythm, No edema. Abdomen:  Soft, nontender and nondistended. Normal bowel sounds, without guarding, and without rebound.   Neurologic:  Alert and  oriented x4;  grossly normal neurologically.  Impression/Plan: Cem Kosman is here for an colonoscopy to be performed for surveillance due to prior history of colon polyps   Risks, benefits, limitations, and  alternatives regarding  colonoscopy have been reviewed with  the patient.  Questions have been answered.  All parties agreeable.   Ruel Kung, MD  01/18/2024, 7:29 AM

## 2024-01-18 NOTE — Anesthesia Postprocedure Evaluation (Signed)
 Anesthesia Post Note  Patient: James Lee  Procedure(s) Performed: COLONOSCOPY  Patient location during evaluation: PACU Anesthesia Type: General Level of consciousness: awake and awake and alert Pain management: satisfactory to patient Vital Signs Assessment: post-procedure vital signs reviewed and stable Respiratory status: spontaneous breathing Cardiovascular status: blood pressure returned to baseline Anesthetic complications: no   No notable events documented.   Last Vitals:  Vitals:   01/18/24 0811 01/18/24 0821  BP: (!) 145/84 (!) 148/76  Pulse: 85   Resp: 16 16  Temp:    SpO2: 97% 97%    Last Pain:  Vitals:   01/18/24 0801  TempSrc: Temporal                 VAN STAVEREN,Dillie Burandt

## 2024-01-18 NOTE — Op Note (Signed)
 [facility] Patient Name: James Lee Procedure Date: 01/18/2024 7:36 AM MRN: [id number] Account #: [id number] Date of Birth: Feb 22, 1951 Admit Type: Outpatient Age: 73 Room: [facility] ENDO ROOM 3 Gender: Male Note Status: Finalized Instrument Name: Colon Scope [id number] Procedure:             Colonoscopy Indications:           Surveillance: History of numerous (> 10) adenomas on                         last colonoscopy (< 3 yrs), Last colonoscopy: May 2024 Providers:             Ruel Kung MD, MD Referring MD:          No Local Md, MD (Referring MD) Medicines:             Monitored Anesthesia Care Complications:         No immediate complications. Procedure:             Pre-Anesthesia Assessment:                        - Prior to the procedure, a History and Physical was                         performed, and patient medications, allergies and                         sensitivities were reviewed. The patient's tolerance                         of previous anesthesia was reviewed.                        - The risks and benefits of the procedure and the                         sedation options and risks were discussed with the                         patient. All questions were answered and informed                         consent was obtained.                        - ASA Grade Assessment: II - A patient with mild                         systemic disease.                        After obtaining informed consent, the colonoscope was                         passed under direct vision. Throughout the procedure,                         the patient's blood pressure, pulse, and oxygen  saturations were monitored continuously. The                         Colonoscope was introduced through the anus and                         advanced to the the cecum, identified by the                         appendiceal orifice. The colonoscopy was  performed                         without difficulty. The patient tolerated the                         procedure well. The quality of the bowel preparation                         was excellent. The ileocecal valve, appendiceal                         orifice, and rectum were photographed. Findings:      The perianal and digital rectal examinations were normal.      Three sessile polyps were found in the descending colon and transverse       colon. The polyps were 3 to 5 mm in size. These polyps were removed with       a cold snare. Resection and retrieval were complete.      The exam was otherwise without abnormality on direct and retroflexion       views. Impression:            - Three 3 to 5 mm polyps in the descending colon,                         removed with a cold snare. Resected and retrieved.                        - The examination was otherwise normal on direct and                         retroflexion views. Recommendation:        - Discharge patient to home (with escort).                        - Resume previous diet.                        - Continue present medications.                        - Await pathology results.                        - Repeat colonoscopy in 3 years for surveillance based                         on pathology results. Procedure Code(s):     --- Professional ---  54614, Colonoscopy, flexible; with removal of                         tumor(s), polyp(s), or other lesion(s) by snare                         technique Diagnosis Code(s):     --- Professional ---                        Z86.010, Personal history of colonic polyps                        D12.4, Benign neoplasm of descending colon CPT copyright 2022 American Medical Association. All rights reserved. The codes documented in this report are preliminary and upon coder review may  be revised to meet current compliance requirements. Ruel Kung, MD Ruel Kung MD, MD 01/18/2024  7:58:23 AM This report has been signed electronically. Number of Addenda: 0 Note Initiated On: 01/18/2024 7:36 AM Scope Withdrawal Time: 0 hours 10 minutes 31 seconds  Total Procedure Duration: 0 hours 12 minutes 29 seconds  Estimated Blood Loss:  Estimated blood loss: none.      [facility]

## 2024-01-18 NOTE — Anesthesia Preprocedure Evaluation (Addendum)
 Anesthesia Evaluation  Patient identified by MRN, date of birth, ID band Patient awake    Reviewed: Allergy & Precautions, NPO status , Patient's Chart, lab work & pertinent test results  Airway Mallampati: II  TM Distance: >3 FB Neck ROM: Limited    Dental  (+) Missing, Partial Lower, Poor Dentition   Pulmonary sleep apnea and Continuous Positive Airway Pressure Ventilation , COPD,  COPD inhaler, Current Smoker and Patient abstained from smoking.   Pulmonary exam normal  + decreased breath sounds      Cardiovascular Exercise Tolerance: Good hypertension, Pt. on medications + CAD and + DOE  Normal cardiovascular exam Rhythm:Regular Rate:Normal     Neuro/Psych negative neurological ROS  negative psych ROS   GI/Hepatic negative GI ROS, Neg liver ROS, PUD,GERD  Medicated,,  Endo/Other  diabetes, Type 2, Oral Hypoglycemic Agents  Class 3 obesity  Renal/GU negative Renal ROS  negative genitourinary   Musculoskeletal   Abdominal  (+) + obese  Peds negative pediatric ROS (+)  Hematology negative hematology ROS (+)   Anesthesia Other Findings Past Medical History: 02/27/2022: Bacterial conjunctivitis of both eyes 09/02/2014: Benign essential HTN 03/14/2022: Bronchitis 03/08/2011: Cancer of prostate (HCC) 05/04/2022: Colon cancer screening No date: Colon polyps No date: Coronary artery calcification seen on CAT scan 09/02/2014: Coronary artery calcification seen on CAT scan No date: Diabetes mellitus without complication (HCC) No date: Dyslipidemia 11/11/2021: Elevated serum creatinine 11/29/2017: Gastric ulcer No date: GERD (gastroesophageal reflux disease) 05/03/2022: History of colonic polyps No date: Hyperlipidemia No date: Hypertension 11/11/2021: Lactic acidosis No date: LBP (low back pain) 06/21/2011: Male erectile dysfunction, unspecified 11/11/2021: NIDDM-2 with hyperglycemia 11/29/2017: Perforated  bowel (HCC) 08/16/2012: Personal history of prostate cancer 2014: Prostate cancer (HCC)     Comment:  prostate  No date: RBBB 04/27/2021: RBBB 08/16/2012: Reduced libido 08/16/2012: Scrotal pain No date: Sleep apnea     Comment:  c-pap wears 11/10/2021: Syncope  Past Surgical History: No date: ABDOMINAL SURGERY     Comment:  gastric ulcer No date: back sugery     Comment:  1996, 2017, 2018 1996: BACK SURGERY     Comment:  2017, 2018, 2019 2024: CATARACT EXTRACTION; Bilateral 09/14/2022: CATARACT EXTRACTION W/PHACO; Left     Comment:  Procedure: CATARACT EXTRACTION PHACO AND INTRAOCULAR               LENS PLACEMENT (IOC) LEFT DIABETIC 16.26 01:17.3;                Surgeon: Mittie Gaskin, MD;  Location: [facility]               SURGERY CNTR;  Service: Ophthalmology;  Laterality: Left; 09/28/2022: CATARACT EXTRACTION W/PHACO; Right     Comment:  Procedure: CATARACT EXTRACTION PHACO AND INTRAOCULAR               LENS PLACEMENT (IOC) RIGHT DIABETIC  9.93  00:49.4;                Surgeon: Mittie Gaskin, MD;  Location: [facility]               SURGERY CNTR;  Service: Ophthalmology;  Laterality:               Right;  Diabetic 05/03/2022: COLONOSCOPY WITH PROPOFOL ; N/A     Comment:  Procedure: COLONOSCOPY WITH PROPOFOL ;  Surgeon: Therisa Bi, MD;  Location: [facility] ENDOSCOPY;  Service:  Gastroenterology;  Laterality: N/A; 05/04/2022: COLONOSCOPY WITH PROPOFOL ; N/A     Comment:  Procedure: COLONOSCOPY WITH PROPOFOL ;  Surgeon: Therisa Bi, MD;  Location: [facility] ENDOSCOPY;  Service:               Gastroenterology;  Laterality: N/A; 11/02/2022: COLONOSCOPY WITH PROPOFOL ; N/A     Comment:  Procedure: COLONOSCOPY WITH PROPOFOL ;  Surgeon: Therisa Bi, MD;  Location: [facility] ENDOSCOPY;  Service:               Gastroenterology;  Laterality: N/A; No date: gastric ulcers     Comment:  surgery 11-28-17 No date: INGUINAL HERNIA REPAIR      Comment:  right 11/28/2017: LAPAROSCOPY; N/A     Comment:  Procedure: LAPAROSCOPY DIAGNOSTIC converted to open;                Surgeon: Rubin Calamity, MD;  Location: [facility] OR;                Service: General;  Laterality: N/A; 11/28/2017: LAPAROTOMY; N/A     Comment:  Procedure: EXPLORATORY LAPAROTOMY;  Surgeon: Rubin Calamity, MD;  Location: [facility] OR;  Service: General;                Laterality: N/A; No date: MENISCUS REPAIR; Right 11/10/2017: POSTERIOR CERVICAL FUSION/FORAMINOTOMY; N/A     Comment:  Procedure: Posterior cervical fusion with lateral mass               fixation Cervical Three-Seven, cervical laminectomy               Cervical Three- Seven;  Surgeon: Joshua Alm RAMAN, MD;                Location: [facility] OR;  Service: Neurosurgery;  Laterality:               N/A;  Posterior cervical fusion with lateral mass               fixation Cervical Three-Seven, cervical laminectomy               Cervical Three- Seven 01/2012: PROSTATECTOMY No date: TONSILLECTOMY No date: TOOTH EXTRACTION  BMI    Body Mass Index: 33.47 kg/m      Reproductive/Obstetrics negative OB ROS                              Anesthesia Physical Anesthesia Plan  ASA: 3  Anesthesia Plan: General   Post-op Pain Management:    Induction: Intravenous  PONV Risk Score and Plan: Propofol  infusion and TIVA  Airway Management Planned: Natural Airway and Nasal Cannula  Additional Equipment:   Intra-op Plan:   Post-operative Plan:   Informed Consent: I have reviewed the patients History and Physical, chart, labs and discussed the procedure including the risks, benefits and alternatives for the proposed anesthesia with the patient or authorized representative who has indicated his/her understanding and acceptance.     Dental Advisory Given  Plan Discussed with: CRNA  Anesthesia Plan Comments:          Anesthesia Quick Evaluation

## 2024-01-18 NOTE — Transfer of Care (Signed)
 Immediate Anesthesia Transfer of Care Note  Patient: James Lee  Procedure(s) Performed: COLONOSCOPY  Patient Location: PACU  Anesthesia Type:MAC  Level of Consciousness: awake and alert   Airway & Oxygen Therapy: Patient Spontanous Breathing and Patient connected to nasal cannula oxygen  Post-op Assessment: Report given to RN and Post -op Vital signs reviewed and stable  Post vital signs: Reviewed and stable  Last Vitals:  Vitals Value Taken Time  BP 112/67   Temp    Pulse 88   Resp 16   SpO2 95     Last Pain:  Vitals:   01/18/24 0714  TempSrc: Temporal         Complications: No notable events documented.

## 2024-01-19 LAB — SURGICAL PATHOLOGY

## 2024-01-20 ENCOUNTER — Other Ambulatory Visit: Payer: Self-pay | Admitting: Cardiology

## 2024-02-07 NOTE — Telephone Encounter (Signed)
 Reached out to Patient again regarding missing documents (Tax) needed and signature. Spoke to his Spouse and she said he forgot to send them and will have him e-mail it back to me after I send it to him and I just sent it over via e-mail as requested.

## 2024-02-14 NOTE — Telephone Encounter (Signed)
 We only have samples of the 3mg .   Manuelita are you able to help with pt assistance?

## 2024-02-16 NOTE — Telephone Encounter (Signed)
 After multiple attempts to receive information needed to complete PAP and no response - notified Pharm D and will close encounter.

## 2024-02-22 ENCOUNTER — Ambulatory Visit: Admitting: Internal Medicine

## 2024-02-22 DIAGNOSIS — K080 Exfoliation of teeth due to systemic causes: Secondary | ICD-10-CM | POA: Diagnosis not present

## 2024-02-23 ENCOUNTER — Ambulatory Visit: Admitting: Podiatry

## 2024-02-23 DIAGNOSIS — Z91199 Patient's noncompliance with other medical treatment and regimen due to unspecified reason: Secondary | ICD-10-CM

## 2024-02-25 NOTE — Progress Notes (Signed)
 1. No-show for appointment

## 2024-02-27 ENCOUNTER — Telehealth: Payer: Self-pay

## 2024-02-27 NOTE — Telephone Encounter (Signed)
 Pt spouse dropped off [employer] Pt Assistance forms today. They are in Dr Graylon color folder up front.

## 2024-02-28 NOTE — Telephone Encounter (Signed)
 Patient assistance form for Rybelsus  14 mg signed and will be faxed to [employer]. Please fax and send original to scanning.    Luke Shade, MD

## 2024-02-29 NOTE — Telephone Encounter (Signed)
 Faxed back to [employer] at [phone].

## 2024-03-05 ENCOUNTER — Encounter: Payer: Self-pay | Admitting: Pharmacist

## 2024-03-05 NOTE — Progress Notes (Signed)
 Patient Assistance Program (PAP)   Manufacturer: [employer]   Medication(s): Rybelsus  > Ozempic per [place] though never received this due to not returning application. Refill form recently sent in for Rybelsus  14 mg refill by new PCP.   Patient previously working with [facility] Patient Advocate Team though never returned [employer] application. Appears patient/wife returned application which was faxed to program by provider last week.   Letter received from [employer] stating income verification is needed.   No record of patient providing tax documentation at this time. CMA has recently discussed this with patient via mychart 9/3 (tax document needed to complete the application).   Notified patient. He plans to bring in income this week.

## 2024-03-19 ENCOUNTER — Encounter: Payer: Self-pay | Admitting: Pharmacist

## 2024-03-29 ENCOUNTER — Ambulatory Visit (INDEPENDENT_AMBULATORY_CARE_PROVIDER_SITE_OTHER): Admitting: Internal Medicine

## 2024-03-29 ENCOUNTER — Encounter: Payer: Self-pay | Admitting: Internal Medicine

## 2024-03-29 VITALS — BP 120/70 | HR 77 | Temp 98.9°F | Ht 71.0 in | Wt 249.8 lb

## 2024-03-29 DIAGNOSIS — F17210 Nicotine dependence, cigarettes, uncomplicated: Secondary | ICD-10-CM

## 2024-03-29 DIAGNOSIS — R0982 Postnasal drip: Secondary | ICD-10-CM

## 2024-03-29 DIAGNOSIS — J449 Chronic obstructive pulmonary disease, unspecified: Secondary | ICD-10-CM | POA: Diagnosis not present

## 2024-03-29 DIAGNOSIS — Z122 Encounter for screening for malignant neoplasm of respiratory organs: Secondary | ICD-10-CM

## 2024-03-29 DIAGNOSIS — G4733 Obstructive sleep apnea (adult) (pediatric): Secondary | ICD-10-CM

## 2024-03-29 MED ORDER — FLUTICASONE PROPIONATE 50 MCG/ACT NA SUSP: 2.0000 | Freq: Every day | NASAL | 6 refills | Status: AC

## 2024-03-29 MED ORDER — ALBUTEROL SULFATE HFA 108 (90 BASE) MCG/ACT IN AERS: 2.0000 | INHALATION_SPRAY | Freq: Four times a day (QID) | RESPIRATORY_TRACT | 12 refills | Status: AC | PRN

## 2024-03-29 NOTE — Progress Notes (Unsigned)
 Name: James Lee MRN: [id number] DOB: [birth year]    March 2016 FEV1 FVC postbronchodilator ratio 65%  FEV1 81% predicted Overall interpretation mild COPD  May 2025 reported PFTs FEV1 FVC of ratio postbronchodilator 68% predicted FEV1 43% predicted  July 2024 Split-night sleep study Severe OSA AHI of 82 Supine AHI of 102  May 2025 LDCT Centrilobular and paraseptal emphysema. Smoking related respiratory bronchiolitis. Minimal scattered mucoid impaction. Pulmonary nodules measure 5.5 mm or less in size, as before. No new pulmonary nodules. No pleural fluid. Airway is unremarkable.   CHIEF COMPLAINT:  Follow-up assess for OSA Follow-up assessment for low-dose CT scan Follow-up assessment for COPD   HISTORY OF PRESENT ILLNESS: Assessment of OSA Discussed sleep data and reviewed with patient.  Encouraged proper weight management.  Discussed driving precautions and its relationship with hypersomnolence.  Discussed sleep hygiene, and benefits of a fixed sleep waked time.  The importance of getting eight or more hours of sleep discussed with patient.  Discussed limiting the use of the computer and television before bedtime.  Decrease naps during the day, so night time sleep will become enhanced.  Limit caffeine, and sleep deprivation.   Patient uses and benefits from therapy Using CPAP nightly and with naps Pressure setting is comfortable and is sleeping well. Auto CPAP 10-15 AHI reduced to 1.9 excellent compliance report      Assessment of COPD Patient has a diagnosis of COPD Last reportable PFTs and a note suggest FEV1 43% predicted This is severe COPD  Uses albuterol  as needed  Actively smokes Patient has signs symptoms of chronic cough with sputum production consistent with chronic bronchitis   Assessment of tobacco abuse Smoking Assessment and Cessation Counseling    Assessment of low-dose CT scan May 2025 LDCT Centrilobular and paraseptal emphysema.  Smoking related respiratory bronchiolitis. Minimal scattered mucoid impaction. Pulmonary nodules measure 5.5 mm or less in size, as before. No new pulmonary nodules. No pleural fluid. Airway is unremarkable.    PAST MEDICAL HISTORY :   has a past medical history of Bacterial conjunctivitis of both eyes (02/27/2022), Benign essential HTN (09/02/2014), Bronchitis (03/14/2022), Cancer of prostate (HCC) (03/08/2011), Colon cancer screening (05/04/2022), Colon polyps, Coronary artery calcification seen on CAT scan, Coronary artery calcification seen on CAT scan (09/02/2014), Diabetes mellitus without complication (HCC), Dyslipidemia, Elevated serum creatinine (11/11/2021), Gastric ulcer (11/29/2017), GERD (gastroesophageal reflux disease), History of colonic polyps (05/03/2022), Hyperlipidemia, Hypertension, Lactic acidosis (11/11/2021), LBP (low back pain), Male erectile dysfunction, unspecified (06/21/2011), NIDDM-2 with hyperglycemia (11/11/2021), Perforated bowel (HCC) (11/29/2017), Personal history of prostate cancer (08/16/2012), Prostate cancer (HCC) (2014), RBBB, RBBB (04/27/2021), Reduced libido (08/16/2012), Scrotal pain (08/16/2012), Sleep apnea, and Syncope (11/10/2021).  has a past surgical history that includes Prostatectomy (01/2012); Meniscus repair (Right); Tonsillectomy; Tooth extraction; Posterior cervical fusion/foraminotomy (N/A, 11/10/2017); laparoscopy (N/A, 11/28/2017); laparotomy (N/A, 11/28/2017); Abdominal surgery; gastric ulcers; Inguinal hernia repair; back sugery; Back surgery (1996); Colonoscopy with propofol  (N/A, 05/03/2022); Colonoscopy with propofol  (N/A, 05/04/2022); Cataract extraction w/PHACO (Left, 09/14/2022); Cataract extraction w/PHACO (Right, 09/28/2022); Cataract extraction (Bilateral, 2024); Colonoscopy with propofol  (N/A, 11/02/2022); Colonoscopy (N/A, 01/18/2024); and Polypectomy (01/18/2024). Prior to Admission medications   Medication Sig Start Date End Date Taking?  Authorizing Provider  amLODipine  (NORVASC ) 5 MG tablet Take 5 mg by mouth daily.    [provider]  aspirin  81 MG tablet Take 1 tablet (81 mg total) by mouth daily. 03/16/18   Costella, Jerrell PARAS, PA-C  chlorthalidone (HYGROTON) 50 MG tablet Take 50 mg by mouth daily. 12/31/21  [provider]  Continuous Blood Gluc Sensor (FREESTYLE LIBRE 2 SENSOR) MISC Place 1 sensor to skin every 14 days 08/09/22   Hope Merle, MD  docusate sodium  (COLACE) 100 MG capsule Take 100 mg by mouth daily.    [provider]  FARXIGA  10 MG TABS tablet Take 10 mg by mouth daily. 12/22/21   [provider]  gabapentin  (NEURONTIN ) 300 MG capsule Take 600 mg by mouth 2 (two) times daily. 10/08/15   [provider]  glipiZIDE  (GLUCOTROL  XL) 5 MG 24 hr tablet TAKE 1 TABLET(5 MG) BY MOUTH DAILY 04/05/22   Hope Merle, MD  metFORMIN  (GLUCOPHAGE -XR) 500 MG 24 hr tablet Take 1,000 mg by mouth 2 (two) times daily with a meal.    [provider]  metoprolol  succinate (TOPROL -XL) 50 MG 24 hr tablet Take 1 tablet (50 mg total) by mouth daily. 05/30/22   Shlomo Wilbert SAUNDERS, MD  Multiple Vitamins-Minerals (MULTIVITAMIN WITH MINERALS) tablet Take 1 tablet by mouth daily.    [provider]  olmesartan  (BENICAR ) 40 MG tablet Take 40 mg by mouth daily. 06/20/22   [provider]  rosuvastatin  (CRESTOR ) 10 MG tablet Take 1 tablet (10 mg total) by mouth daily. 06/17/22   Hope Merle, MD  Semaglutide  (RYBELSUS ) 7 MG TABS Take 7 mg by mouth daily. 04/26/22   Hope Merle, MD   No Known Allergies  FAMILY HISTORY:  family history includes Alcohol abuse in his mother; Coronary artery disease in his mother; Depression in his sister; Diabetes in his sister; Drug abuse in his mother; Early death in his mother; Esophageal cancer in his father; Hyperlipidemia in his mother; Hypertension in his mother; Prostate cancer (age of onset: 65) in his brother; Throat cancer in his  father. SOCIAL HISTORY:  reports that he has been smoking cigarettes. He started smoking about 12 years ago. He has a 77.1 pack-year smoking history. He has never used smokeless tobacco. He reports current alcohol use. He reports that he does not use drugs.      Physical Examination:  General Appearance: No distress  EYES EOM intact.   NECK Supple, No JVD Pulmonary: normal breath sounds, No wheezing.  CardiovascularNormal S1,S2.  No m/r/g.   Ext pulses intact, cap refill intact  ALL OTHER ROS ARE NEGATIVE    ASSESSMENT AND PLAN SYNOPSIS  73 year old white male seen today for follow-up assessment for multiple medical issues including underlying severe sleep apnea with underlying severe COPD in the setting of deconditioned state with active smoking enrolled in the lung cancer screening program   Assessment of OSA Previous AHI 82 Continue CPAP as prescribed  Excellent compliance report Reviewed compliance report in detail with patient Patient definitely benefits the use of CPAP therapy as prescribed Using CPAP nightly and with naps Pressure setting is comfortable and is sleeping well. CPAP prescription 10-15 AHI reduced to 1.9  No evidence of acute heart failure at this time No respiratory distress No fevers, chills, nausea, vomiting, diarrhea No evidence hemoptysis  Patient Instructions Continue to use CPAP every night, minimum of 4-6 hours a night.  Change equipment every 30 days or as directed by DME.  Wash your tubing with warm soap and water daily, hang to dry.  Wash humidifier portion weekly. Use bottled, distilled water and change daily  Risk of untreated sleep apnea including cardiac arrhthymias, stroke, DM, pulm HTN.   Assessment of COPD Pulmonary function test revealed severe COPD FEV1 43% predicted Gold stage D Currently maintenance therapy with  Advair No exacerbation at this time Avoid Allergens and Irritants Avoid secondhand smoke Avoid SICK  contacts Recommend  Masking  when appropriate Recommend Keep up-to-date with vaccinations  Smoking Assessment and Cessation Counseling Upon further questioning, Patient smokes 1 ppd I have advised patient to quit/stop smoking as soon as possible due to high risk for multiple medical problems Patient is NOT willing to quit smoking I have advised patient that we can assist and have options of Nicotine replacement therapy. I also advised patient on behavioral therapy and can provide oral medication therapy in conjunction with the other therapies Follow up next Office visit  for assessment of smoking cessation Smoking cessation counseling advised for 4 minutes  Follow-up lung cancer screening program Recent low-dose CT 2025 no significant findings for malignancy Follow-up annually   MEDICATION ADJUSTMENTS/LABS AND TESTS ORDERED: Continue CPAP as prescribed Please stop smoking Continue Advair Albuterol  as needed CONTINUE LUNG CANCER CT SCANS EVERY YEAR  MEDICATION ADJUSTMENTS/LABS AND TESTS ORDERED:    CURRENT MEDICATIONS REVIEWED AT LENGTH WITH PATIENT TODAY   Patient  satisfied with Plan of action and management. All questions answered   Follow up 1 year   I spent a total of 54 minutes dedicated to the care of this patient on the date of this encounter to include pre-visit review of records, face-to-face time with the patient discussing conditions above, post visit ordering of testing, clinical documentation with the electronic health record, making appropriate referrals as documented, and communicating necessary information to the patient's healthcare team.    The Patient requires high complexity decision making for assessment and support, frequent evaluation and titration of therapies, application of advanced monitoring technologies and extensive interpretation of multiple databases.  Patient satisfied with Plan of action and management. All questions answered    Nickolas Alm Cellar, M.D.  [facility]  Medical Director [facility] [place]

## 2024-03-29 NOTE — Patient Instructions (Signed)
 Excellent Job A+ GOLD STAR!!  Continue CPAP as prescribed  Patient Instructions Continue to use CPAP every night, minimum of 4-6 hours a night.  Change equipment every 30 days or as directed by DME.  Wash your tubing with warm soap and water daily, hang to dry. Wash humidifier portion weekly. Use bottled, distilled water and change daily   Be aware of reduced alertness and do not drive or operate heavy machinery if experiencing this or drowsiness.  Exercise encouraged, as tolerated. Encouraged proper weight management.  Important to get eight or more hours of sleep  Limiting the use of the computer and television before bedtime.  Decrease naps during the day, so night time sleep will become enhanced.  Limit caffeine, and sleep deprivation.    Avoid Allergens and Irritants Avoid secondhand smoke Avoid SICK contacts Recommend  Masking  when appropriate Recommend Keep up-to-date with vaccinations  Please stop smoking Continue Advair as prescribed rinse mouth after use Use albuterol  as needed  Continue low-dose CT lung cancer screening protocol

## 2024-04-02 ENCOUNTER — Encounter: Payer: Self-pay | Admitting: Pharmacist

## 2024-04-02 NOTE — Progress Notes (Signed)
 Mychart message sent to patient on 03/19/24 regarding changes to [employer] Patient Assistance Program in 2026. [employer] will no longer be offering Ozempic  or Rybelsus  to Medicare patients in 2026.   Mychart message not read as of 04/02/24. Letter sent to patient via mail.

## 2024-04-03 DIAGNOSIS — N393 Stress incontinence (female) (male): Secondary | ICD-10-CM | POA: Diagnosis not present

## 2024-04-03 DIAGNOSIS — N184 Chronic kidney disease, stage 4 (severe): Secondary | ICD-10-CM | POA: Diagnosis not present

## 2024-04-03 DIAGNOSIS — N5231 Erectile dysfunction following radical prostatectomy: Secondary | ICD-10-CM | POA: Diagnosis not present

## 2024-04-03 DIAGNOSIS — E669 Obesity, unspecified: Secondary | ICD-10-CM | POA: Diagnosis not present

## 2024-04-15 ENCOUNTER — Encounter: Payer: Self-pay | Admitting: Podiatry

## 2024-04-15 ENCOUNTER — Ambulatory Visit: Admitting: Podiatry

## 2024-04-15 DIAGNOSIS — E1142 Type 2 diabetes mellitus with diabetic polyneuropathy: Secondary | ICD-10-CM

## 2024-04-15 DIAGNOSIS — B351 Tinea unguium: Secondary | ICD-10-CM

## 2024-04-15 DIAGNOSIS — M79674 Pain in right toe(s): Secondary | ICD-10-CM

## 2024-04-15 DIAGNOSIS — M79675 Pain in left toe(s): Secondary | ICD-10-CM | POA: Diagnosis not present

## 2024-04-19 NOTE — Progress Notes (Signed)
  Subjective:  Patient ID: James Lee, male    DOB: May 09, 1951,  MRN: [id number]  James Lee presents to clinic today for at risk foot care with history of diabetic neuropathy and painful mycotic toenails of both feet that are difficult to trim. Pain interferes with daily activities and wearing enclosed shoe gear comfortably.  Chief Complaint  Patient presents with   Toe Pain    Diabetic foot care. Dr. Abbey is his PCP. Last visit was in May. A1c is 6.9   New problem(s): None.   PCP is Lee, Luke, MD.  No Known Allergies  Review of Systems: Negative except as noted in the HPI.  Objective: No changes noted in today's physical examination. There were no vitals filed for this visit. James Lee is a pleasant 73 y.o. male in NAD. AAO x 3.  Vascular Examination: Capillary refill time immediate b/l. Palpable pedal pulses. Pedal hair present b/l. No pain with calf compression b/l. Skin temperature gradient WNL b/l. No cyanosis or clubbing b/l. No ischemia or gangrene noted b/l.   Neurological Examination: .Pt has subjective symptoms of neuropathy. Sensation grossly intact b/l with 10 gram monofilament. Vibratory sensation intact b/l.   Dermatological Examination: Pedal skin with normal turgor, texture and tone b/l.  No open wounds. No interdigital macerations.   Toenails 1-5 b/l thick, discolored, elongated with subungual debris and pain on dorsal palpation.   No hyperkeratotic nor porokeratotic lesions present on today's visit.  Musculoskeletal Examination: Muscle strength 5/5 to all LE muscle groups of b/l lower extremities. Hammertoe(s) R 2nd toe.  Radiographs: None  Assessment/Plan: 1. Pain due to onychomycosis of toenails of both feet   2. Diabetic polyneuropathy associated with type 2 diabetes mellitus (HCC)   Patient was evaluated and treated. All patient's and/or POA's questions/concerns addressed on today's visit. Toenails 1-5 b/l debrided in length and girth without  incident. Continue foot and shoe inspections daily. Monitor blood glucose per PCP/Endocrinologist's recommendations. Continue soft, supportive shoe gear daily. Report any pedal injuries to medical professional. Call office if there are any questions/concerns. -Patient/POA to call should there be question/concern in the interim.   Return in about 3 months (around 07/16/2024).  [name], DPM      [place] LOCATION: [address], [place] [address]                   Office [phone]   [facility] LOCATION: [address] [place], [place] [address] Office [phone]

## 2024-05-06 DIAGNOSIS — N1832 Chronic kidney disease, stage 3b: Secondary | ICD-10-CM | POA: Diagnosis not present

## 2024-05-13 DIAGNOSIS — E1129 Type 2 diabetes mellitus with other diabetic kidney complication: Secondary | ICD-10-CM | POA: Diagnosis not present

## 2024-05-13 DIAGNOSIS — E785 Hyperlipidemia, unspecified: Secondary | ICD-10-CM | POA: Diagnosis not present

## 2024-05-13 DIAGNOSIS — N1832 Chronic kidney disease, stage 3b: Secondary | ICD-10-CM | POA: Diagnosis not present

## 2024-05-13 DIAGNOSIS — I1A0 Resistant hypertension: Secondary | ICD-10-CM | POA: Diagnosis not present

## 2024-06-17 ENCOUNTER — Ambulatory Visit: Payer: Medicare Other

## 2024-06-19 ENCOUNTER — Telehealth: Payer: Self-pay

## 2024-06-19 NOTE — Telephone Encounter (Signed)
 Left VM and sent MyChart message to let pt know TOC visit needs to be rescheduled.  E2C2, please transfer patient to front office for rescheduling if they call back -kh

## 2024-07-16 ENCOUNTER — Ambulatory Visit

## 2024-07-16 VITALS — BP 106/80 | HR 82 | Temp 98.5°F | Ht 71.0 in | Wt 247.6 lb

## 2024-07-16 DIAGNOSIS — E1142 Type 2 diabetes mellitus with diabetic polyneuropathy: Secondary | ICD-10-CM

## 2024-07-16 DIAGNOSIS — Z860100 Personal history of colon polyps, unspecified: Secondary | ICD-10-CM

## 2024-07-16 DIAGNOSIS — E66811 Obesity, class 1: Secondary | ICD-10-CM | POA: Insufficient documentation

## 2024-07-16 DIAGNOSIS — Z720 Tobacco use: Secondary | ICD-10-CM

## 2024-07-16 DIAGNOSIS — E1169 Type 2 diabetes mellitus with other specified complication: Secondary | ICD-10-CM

## 2024-07-16 DIAGNOSIS — Z23 Encounter for immunization: Secondary | ICD-10-CM

## 2024-07-16 DIAGNOSIS — E1159 Type 2 diabetes mellitus with other circulatory complications: Secondary | ICD-10-CM

## 2024-07-16 DIAGNOSIS — N1832 Chronic kidney disease, stage 3b: Secondary | ICD-10-CM

## 2024-07-16 DIAGNOSIS — G4733 Obstructive sleep apnea (adult) (pediatric): Secondary | ICD-10-CM

## 2024-07-16 DIAGNOSIS — J449 Chronic obstructive pulmonary disease, unspecified: Secondary | ICD-10-CM

## 2024-07-16 LAB — LIPID PANEL
Cholesterol: 138 mg/dL (ref 28–200)
HDL: 29.1 mg/dL — ABNORMAL LOW
LDL Cholesterol: 45 mg/dL (ref 10–99)
NonHDL: 109.23
Total CHOL/HDL Ratio: 5
Triglycerides: 320 mg/dL — ABNORMAL HIGH (ref 10.0–149.0)
VLDL: 64 mg/dL — ABNORMAL HIGH (ref 0.0–40.0)

## 2024-07-16 LAB — HEMOGLOBIN A1C: Hgb A1c MFr Bld: 7.3 % — ABNORMAL HIGH (ref 4.6–6.5)

## 2024-07-16 MED ORDER — METFORMIN HCL ER 500 MG PO TB24: 1000.0000 mg | ORAL_TABLET | Freq: Two times a day (BID) | ORAL | 3 refills | Status: AC

## 2024-07-16 MED ORDER — GABAPENTIN 300 MG PO CAPS: 600.0000 mg | ORAL_CAPSULE | Freq: Two times a day (BID) | ORAL | 3 refills | Status: AC

## 2024-07-16 MED ORDER — FARXIGA 10 MG PO TABS: 10.0000 mg | ORAL_TABLET | Freq: Every day | ORAL | 3 refills | Status: AC

## 2024-07-16 MED ORDER — NICOTINE 14 MG/24HR TD PT24: 14.0000 mg | MEDICATED_PATCH | Freq: Every day | TRANSDERMAL | 1 refills | Status: AC

## 2024-07-16 MED ORDER — FREESTYLE LIBRE 2 PLUS SENSOR MISC: 6 refills | Status: AC

## 2024-07-16 MED ORDER — ROSUVASTATIN CALCIUM 10 MG PO TABS: 10.0000 mg | ORAL_TABLET | Freq: Every evening | ORAL | 3 refills | Status: AC

## 2024-07-16 NOTE — Assessment & Plan Note (Signed)
 Updated today.

## 2024-07-16 NOTE — Assessment & Plan Note (Addendum)
 Management per pulmonology.  No dyspnea reported today.  Smoking cessation counseling provided.

## 2024-07-16 NOTE — Assessment & Plan Note (Addendum)
 Chronic, goal BP <130/80 mmHg.  Continue: Amlodipine  5 mg, chlorthalidone 50 mg, olmesartan  40 mg daily daily as prescribed by nephrology.  Continue metoprolol  succinate 50 mg daily as prescribed by cardiology.  Continue follow-up with nephrology as scheduled and annual cardiology follow-up.

## 2024-07-16 NOTE — Assessment & Plan Note (Addendum)
 Currently on [place], albuterol  as needed, management per pulmonology.

## 2024-07-16 NOTE — Assessment & Plan Note (Addendum)
 Encouraged activity as tolerated.

## 2024-07-16 NOTE — Assessment & Plan Note (Addendum)
 Continued smoking 1.5 packs/day. Previous cessation attempts failed. Brief smoking cessation counseling provided to the patient.  Start nicotine  patch as below.  Encouraged patient not to smoke when using nicotine  patch. Orders:   nicotine  (NICODERM CQ  - DOSED IN MG/24 HOURS) 14 mg/24hr patch; Place 1 patch (14 mg total) onto the skin daily.

## 2024-07-16 NOTE — Assessment & Plan Note (Addendum)
 Continue rosuvastatin  10 mg daily.  Refill sent.  Check lipid panel today. Orders:   Lipid panel   rosuvastatin  (CRESTOR ) 10 MG tablet; Take 1 tablet (10 mg total) by mouth every evening.

## 2024-07-17 ENCOUNTER — Encounter

## 2024-07-17 ENCOUNTER — Ambulatory Visit: Payer: Self-pay

## 2024-07-23 ENCOUNTER — Ambulatory Visit: Admitting: Dermatology

## 2024-07-29 ENCOUNTER — Ambulatory Visit: Admitting: Podiatry

## 2024-07-30 ENCOUNTER — Encounter

## 2024-07-31 ENCOUNTER — Ambulatory Visit

## 2024-10-16 ENCOUNTER — Ambulatory Visit: Admitting: Dermatology

## 2025-01-13 ENCOUNTER — Ambulatory Visit
